# Patient Record
Sex: Female | Born: 1960
Health system: Southern US, Community
[De-identification: ages and names within clinical notes are randomized; demographics above are authoritative.]

## PROBLEM LIST (undated history)

## (undated) DIAGNOSIS — F329 Major depressive disorder, single episode, unspecified: Secondary | ICD-10-CM

## (undated) DIAGNOSIS — R21 Rash and other nonspecific skin eruption: Secondary | ICD-10-CM

## (undated) DIAGNOSIS — T8859XA Other complications of anesthesia, initial encounter: Secondary | ICD-10-CM

## (undated) DIAGNOSIS — I493 Ventricular premature depolarization: Secondary | ICD-10-CM

## (undated) DIAGNOSIS — K219 Gastro-esophageal reflux disease without esophagitis: Secondary | ICD-10-CM

## (undated) DIAGNOSIS — D509 Iron deficiency anemia, unspecified: Secondary | ICD-10-CM

## (undated) DIAGNOSIS — A879 Viral meningitis, unspecified: Secondary | ICD-10-CM

## (undated) DIAGNOSIS — M5136 Other intervertebral disc degeneration, lumbar region: Secondary | ICD-10-CM

## (undated) DIAGNOSIS — E663 Overweight: Secondary | ICD-10-CM

## (undated) DIAGNOSIS — G43909 Migraine, unspecified, not intractable, without status migrainosus: Secondary | ICD-10-CM

## (undated) DIAGNOSIS — F32A Depression, unspecified: Secondary | ICD-10-CM

## (undated) DIAGNOSIS — N3289 Other specified disorders of bladder: Secondary | ICD-10-CM

## (undated) DIAGNOSIS — T4145XA Adverse effect of unspecified anesthetic, initial encounter: Secondary | ICD-10-CM

## (undated) DIAGNOSIS — E669 Obesity, unspecified: Secondary | ICD-10-CM

## (undated) DIAGNOSIS — R000 Tachycardia, unspecified: Secondary | ICD-10-CM

## (undated) DIAGNOSIS — C719 Malignant neoplasm of brain, unspecified: Secondary | ICD-10-CM

## (undated) DIAGNOSIS — D649 Anemia, unspecified: Secondary | ICD-10-CM

## (undated) DIAGNOSIS — M51369 Other intervertebral disc degeneration, lumbar region without mention of lumbar back pain or lower extremity pain: Secondary | ICD-10-CM

## (undated) DIAGNOSIS — I10 Essential (primary) hypertension: Secondary | ICD-10-CM

## (undated) HISTORY — DX: Ventricular premature depolarization: I49.3

## (undated) HISTORY — PX: OTHER SURGICAL HISTORY: SHX169

## (undated) HISTORY — PX: BACK SURGERY: SHX140

## (undated) HISTORY — DX: Overweight: E66.3

## (undated) HISTORY — PX: ABDOMINAL HYSTERECTOMY: SHX81

## (undated) HISTORY — DX: Depression, unspecified: F32.A

## (undated) HISTORY — PX: CYSTOCELE REPAIR: SHX163

## (undated) HISTORY — DX: Migraine, unspecified, not intractable, without status migrainosus: G43.909

## (undated) HISTORY — DX: Tachycardia, unspecified: R00.0

## (undated) HISTORY — DX: Essential (primary) hypertension: I10

## (undated) HISTORY — PX: PORTACATH PLACEMENT: SHX2246

## (undated) HISTORY — DX: Iron deficiency anemia, unspecified: D50.9

## (undated) HISTORY — DX: Anemia, unspecified: D64.9

## (undated) HISTORY — DX: Major depressive disorder, single episode, unspecified: F32.9

## (undated) HISTORY — DX: Malignant neoplasm of brain, unspecified: C71.9

## (undated) HISTORY — PX: BLADDER SURGERY: SHX569

## (undated) HISTORY — DX: Viral meningitis, unspecified: A87.9

## (undated) HISTORY — PX: BILATERAL SALPINGOOPHORECTOMY: SHX1223

## (undated) HISTORY — DX: Obesity, unspecified: E66.9

---

## 1999-01-20 ENCOUNTER — Other Ambulatory Visit: Admission: RE | Admit: 1999-01-20 | Discharge: 1999-01-20 | Payer: Self-pay | Admitting: Obstetrics and Gynecology

## 2000-02-29 ENCOUNTER — Other Ambulatory Visit: Admission: RE | Admit: 2000-02-29 | Discharge: 2000-02-29 | Payer: Self-pay | Admitting: Obstetrics and Gynecology

## 2001-04-03 ENCOUNTER — Other Ambulatory Visit: Admission: RE | Admit: 2001-04-03 | Discharge: 2001-04-03 | Payer: Self-pay | Admitting: Obstetrics and Gynecology

## 2001-06-24 ENCOUNTER — Emergency Department (HOSPITAL_COMMUNITY): Admission: EM | Admit: 2001-06-24 | Discharge: 2001-06-24 | Payer: Self-pay | Admitting: Emergency Medicine

## 2002-05-18 ENCOUNTER — Encounter: Admission: RE | Admit: 2002-05-18 | Discharge: 2002-05-18 | Payer: Self-pay | Admitting: Occupational Medicine

## 2002-05-18 ENCOUNTER — Encounter: Payer: Self-pay | Admitting: Occupational Medicine

## 2002-08-25 ENCOUNTER — Other Ambulatory Visit: Admission: RE | Admit: 2002-08-25 | Discharge: 2002-08-25 | Payer: Self-pay | Admitting: Obstetrics and Gynecology

## 2002-12-16 ENCOUNTER — Encounter: Admission: RE | Admit: 2002-12-16 | Discharge: 2002-12-16 | Payer: Self-pay | Admitting: Occupational Medicine

## 2003-02-23 ENCOUNTER — Encounter: Admission: RE | Admit: 2003-02-23 | Discharge: 2003-05-24 | Payer: Self-pay | Admitting: Occupational Medicine

## 2003-05-06 ENCOUNTER — Encounter: Payer: Self-pay | Admitting: Orthopedic Surgery

## 2003-05-06 ENCOUNTER — Ambulatory Visit (HOSPITAL_COMMUNITY): Admission: RE | Admit: 2003-05-06 | Discharge: 2003-05-06 | Payer: Self-pay | Admitting: Orthopedic Surgery

## 2003-09-28 ENCOUNTER — Other Ambulatory Visit: Admission: RE | Admit: 2003-09-28 | Discharge: 2003-09-28 | Payer: Self-pay | Admitting: Obstetrics and Gynecology

## 2003-11-03 ENCOUNTER — Encounter (INDEPENDENT_AMBULATORY_CARE_PROVIDER_SITE_OTHER): Payer: Self-pay | Admitting: Specialist

## 2003-11-04 ENCOUNTER — Inpatient Hospital Stay (HOSPITAL_COMMUNITY): Admission: RE | Admit: 2003-11-04 | Discharge: 2003-11-05 | Payer: Self-pay | Admitting: Obstetrics and Gynecology

## 2004-11-03 ENCOUNTER — Other Ambulatory Visit: Admission: RE | Admit: 2004-11-03 | Discharge: 2004-11-03 | Payer: Self-pay | Admitting: Obstetrics and Gynecology

## 2005-10-07 ENCOUNTER — Encounter: Payer: Self-pay | Admitting: Emergency Medicine

## 2005-10-08 ENCOUNTER — Inpatient Hospital Stay (HOSPITAL_COMMUNITY): Admission: EM | Admit: 2005-10-08 | Discharge: 2005-10-12 | Payer: Self-pay | Admitting: Emergency Medicine

## 2005-10-08 ENCOUNTER — Encounter: Payer: Self-pay | Admitting: Cardiology

## 2005-10-08 DIAGNOSIS — C719 Malignant neoplasm of brain, unspecified: Secondary | ICD-10-CM

## 2005-10-08 HISTORY — DX: Malignant neoplasm of brain, unspecified: C71.9

## 2005-10-09 ENCOUNTER — Encounter (INDEPENDENT_AMBULATORY_CARE_PROVIDER_SITE_OTHER): Payer: Self-pay | Admitting: *Deleted

## 2005-10-09 DIAGNOSIS — C719 Malignant neoplasm of brain, unspecified: Secondary | ICD-10-CM

## 2005-10-09 DIAGNOSIS — C712 Malignant neoplasm of temporal lobe: Secondary | ICD-10-CM | POA: Insufficient documentation

## 2005-10-09 HISTORY — DX: Malignant neoplasm of brain, unspecified: C71.9

## 2005-10-11 ENCOUNTER — Ambulatory Visit: Payer: Self-pay | Admitting: Hematology and Oncology

## 2005-10-12 ENCOUNTER — Ambulatory Visit: Payer: Self-pay | Admitting: Hematology and Oncology

## 2005-10-17 ENCOUNTER — Ambulatory Visit: Admission: RE | Admit: 2005-10-17 | Discharge: 2005-11-15 | Payer: Self-pay | Admitting: Radiation Oncology

## 2005-10-25 ENCOUNTER — Ambulatory Visit (HOSPITAL_COMMUNITY): Admission: RE | Admit: 2005-10-25 | Discharge: 2005-10-25 | Payer: Self-pay | Admitting: Hematology and Oncology

## 2007-01-07 ENCOUNTER — Ambulatory Visit (HOSPITAL_COMMUNITY): Admission: RE | Admit: 2007-01-07 | Discharge: 2007-01-07 | Payer: Self-pay | Admitting: Obstetrics and Gynecology

## 2008-01-09 ENCOUNTER — Ambulatory Visit (HOSPITAL_COMMUNITY): Admission: RE | Admit: 2008-01-09 | Discharge: 2008-01-09 | Payer: Self-pay | Admitting: Obstetrics and Gynecology

## 2008-01-28 ENCOUNTER — Encounter: Admission: RE | Admit: 2008-01-28 | Discharge: 2008-01-28 | Payer: Self-pay | Admitting: Obstetrics and Gynecology

## 2008-10-31 ENCOUNTER — Emergency Department (HOSPITAL_COMMUNITY): Admission: EM | Admit: 2008-10-31 | Discharge: 2008-10-31 | Payer: Self-pay | Admitting: *Deleted

## 2008-11-01 ENCOUNTER — Encounter: Payer: Self-pay | Admitting: Cardiology

## 2008-11-10 ENCOUNTER — Encounter: Admission: RE | Admit: 2008-11-10 | Discharge: 2008-11-10 | Payer: Self-pay | Admitting: Neurosurgery

## 2009-01-12 ENCOUNTER — Encounter: Payer: Self-pay | Admitting: Cardiology

## 2009-01-14 ENCOUNTER — Ambulatory Visit (HOSPITAL_COMMUNITY): Admission: RE | Admit: 2009-01-14 | Discharge: 2009-01-15 | Payer: Self-pay | Admitting: Neurosurgery

## 2009-01-26 ENCOUNTER — Encounter: Admission: RE | Admit: 2009-01-26 | Discharge: 2009-01-26 | Payer: Self-pay | Admitting: Obstetrics and Gynecology

## 2009-10-10 ENCOUNTER — Encounter: Payer: Self-pay | Admitting: Cardiology

## 2010-01-26 ENCOUNTER — Encounter: Payer: Self-pay | Admitting: Cardiology

## 2010-01-31 ENCOUNTER — Encounter: Admission: RE | Admit: 2010-01-31 | Discharge: 2010-01-31 | Payer: Self-pay | Admitting: Obstetrics and Gynecology

## 2010-03-27 ENCOUNTER — Encounter: Payer: Self-pay | Admitting: Cardiology

## 2010-06-16 DIAGNOSIS — G43909 Migraine, unspecified, not intractable, without status migrainosus: Secondary | ICD-10-CM | POA: Insufficient documentation

## 2010-06-16 DIAGNOSIS — N83209 Unspecified ovarian cyst, unspecified side: Secondary | ICD-10-CM | POA: Insufficient documentation

## 2010-06-20 ENCOUNTER — Ambulatory Visit: Payer: Self-pay | Admitting: Cardiology

## 2010-06-20 DIAGNOSIS — I4949 Other premature depolarization: Secondary | ICD-10-CM | POA: Insufficient documentation

## 2010-06-20 DIAGNOSIS — E663 Overweight: Secondary | ICD-10-CM | POA: Insufficient documentation

## 2010-06-20 DIAGNOSIS — R000 Tachycardia, unspecified: Secondary | ICD-10-CM | POA: Insufficient documentation

## 2010-06-21 ENCOUNTER — Encounter: Payer: Self-pay | Admitting: Cardiology

## 2010-06-21 ENCOUNTER — Ambulatory Visit (HOSPITAL_COMMUNITY): Admission: RE | Admit: 2010-06-21 | Discharge: 2010-06-21 | Payer: Self-pay | Admitting: Cardiology

## 2010-06-21 ENCOUNTER — Ambulatory Visit: Payer: Self-pay

## 2010-06-21 ENCOUNTER — Ambulatory Visit: Payer: Self-pay | Admitting: Cardiology

## 2010-06-28 ENCOUNTER — Ambulatory Visit: Payer: Self-pay | Admitting: Cardiology

## 2010-07-31 ENCOUNTER — Encounter: Payer: Self-pay | Admitting: Cardiology

## 2010-08-01 ENCOUNTER — Ambulatory Visit: Payer: Self-pay | Admitting: Cardiology

## 2010-10-29 ENCOUNTER — Encounter: Payer: Self-pay | Admitting: Obstetrics and Gynecology

## 2010-10-29 ENCOUNTER — Encounter: Payer: Self-pay | Admitting: Neurosurgery

## 2010-11-07 NOTE — Letter (Signed)
Summary: [facility] - Hematology & Oncology OutPatient Clinic - Pathology Repor  [facility] - Hematology & Oncology OutPatient Clinic - Pathology Report   Imported By: Marylou Mccoy 07/17/2010 08:06:22  _____________________________________________________________________  External Attachment:    Type:   Image     Comment:   External Document

## 2010-11-07 NOTE — Miscellaneous (Signed)
  Clinical Lists Changes  Observations: Added new observation of ECHOINTERP:   Study Conclusions    - Left ventricle: Wall thickness was increased in a pattern of mild     LVH. The estimated ejection fraction was 70%. Wall motion was     normal; there were no regional wall motion abnormalities. Left     ventricular diastolic function parameters were normal.   - Left atrium: The atrium was mildly dilated.   - Right ventricle: The cavity size was mildly dilated. Systolic     function was normal.   - Right atrium: The atrium was mildly dilated.   Transthoracic echocardiography. M-mode, complete 2D, spectral   Doppler, and color Doppler. Height: Height: 162.6cm. Height: 64in.   Weight: Weight: 127.9kg. Weight: 281.4lb. Body mass index: BMI:   48.4kg/m^2. Body surface area: BSA: 2.15m^2. Blood pressure: 139/97.   Patient status: Outpatient. Location: [facility]   Prepared and Electronically Authenticated by    Willa Rough, MD, [facility]  (06/21/2010 9:59)      Echocardiogram  Procedure date:  06/21/2010  Findings:        Study Conclusions    - Left ventricle: Wall thickness was increased in a pattern of mild     LVH. The estimated ejection fraction was 70%. Wall motion was     normal; there were no regional wall motion abnormalities. Left     ventricular diastolic function parameters were normal.   - Left atrium: The atrium was mildly dilated.   - Right ventricle: The cavity size was mildly dilated. Systolic     function was normal.   - Right atrium: The atrium was mildly dilated.   Transthoracic echocardiography. M-mode, complete 2D, spectral   Doppler, and color Doppler. Height: Height: 162.6cm. Height: 64in.   Weight: Weight: 127.9kg. Weight: 281.4lb. Body mass index: BMI:   48.4kg/m^2. Body surface area: BSA: 2.56m^2. Blood pressure: 139/97.   Patient status: Outpatient. Location: [facility]   Prepared and Electronically Authenticated by    Willa Rough,  MD, [facility]

## 2010-11-07 NOTE — Assessment & Plan Note (Signed)
Summary: np6/dx:eval myoview for new sinus arrhythmia/htn/obesity   Visit Type:  Initial Consult Primary Provider:  Liana Crocker, MD  CC:  Tachycardia.  History of Present Illness: The patient presents for evaluation of tachycardia.  She has had no prior cardiac history. She has noticed an increasing baseline heart rate over months. She's also noticed some increased dyspnea with activity such as walking 100 yards on level ground. She is not describing resting shortness of breath, PND or orthopnea. This has been a slowly progressive complaint. She has some vague chest discomfort with this but no jaw or arm discomfort. 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 She has not had any presyncope or syncope. Of note she has not been particularly active she walks at work. She has had a rough few years being treated for a glioblastoma and having back surgery.  Current Medications (verified): 1)  Lisinopril 20 Mg Tabs (Lisinopril) .... Take One Tablet By Mouth Daily 2)  Estradiol 2 Mg Tabs (Estradiol) .... Once Daily 3)  Nexium 40 Mg Cpdr (Esomeprazole Magnesium) .... Once Daily 4)  Flexeril 10 Mg Tabs (Cyclobenzaprine Hcl) .... Uad 5)  Mobic 7.5 Mg Tabs (Meloxicam) .... As Needed 6)  Vicodin 5-500 Mg Tabs (Hydrocodone-Acetaminophen) .... As Needed 7)  Ativan 1 Mg Tabs (Lorazepam) .... As Needed  Allergies (verified): 1)  ! Sudafed 2)  ! Morphine 3)  ! * Red Dye 4)  ! * Shrimp  Past History:  Past Medical History: Migraine headaches. Bilateral ovarian cysts, Glioblastoma multiform Depression/anxiety Hypertension Viral meningitis Microcytic anemia  Past Surgical History: Hysterectomy Bilateral salpingo-oophorectomy Bbladder tack. Resection of a glioblastoma multiform in Lumbar back surgery  Family History:  Pertinent in that her niece has cancer of the tongue.  There is no history of brain tumors. Her  father had heart failure and atrial fibrillation and later age. Her mother had bypass at age 7.  Review of Systems       Positive for migraines, PVCs, reflux, ankle edema. Otherwise as stated in the history of present illness negative for all other systems.  Vital Signs:  Patient profile:   50 year old female Height:      64 inches Weight:      284 pounds BMI:     48.92 Pulse rate:   98 / minute BP sitting:   122 / 88  (right arm)  Vitals Entered By: Laurance Flatten CMA (June 20, 2010 12:37 PM)  Physical Exam  General:  Well developed, well nourished, in no acute distress. Head:  normocephalic and atraumatic Eyes:  PERRLA/EOM intact; conjunctiva and lids normal. Mouth:  Teeth, gums and palate normal. Oral mucosa normal. Neck:  Neck supple, no JVD. No masses, thyromegaly or abnormal cervical nodes. Chest Wall:  no deformities or breast masses noted Lungs:  Clear bilaterally to auscultation and percussion. Abdomen:  Bowel sounds positive; abdomen soft and non-tender without masses, organomegaly, or hernias noted. No hepatosplenomegaly. Msk:  Back normal, normal gait. Muscle strength and tone normal. Extremities:  No clubbing or cyanosis. Neurologic:  Alert and oriented x 3. Skin:  Intact without lesions or rashes. Cervical Nodes:  no significant adenopathy Psych:  Normal affect.   Detailed Cardiovascular Exam  Neck    Carotids: Carotids full and equal bilaterally without bruits.      Neck Veins: Normal, no JVD.    Heart    Inspection: no deformities or lifts  noted.      Palpation: normal PMI with no thrills palpable.      Auscultation: regular rate and rhythm, S1, S2 without murmurs, rubs, gallops, or clicks.    Vascular    Abdominal Aorta: no palpable masses, pulsations, or audible bruits.      Femoral Pulses: normal femoral pulses bilaterally.      Pedal Pulses: normal pedal pulses bilaterally.      Radial Pulses: normal radial pulses bilaterally.      Peripheral  Circulation: no clubbing, cyanosis, or edema noted with normal capillary refill.     EKG  Procedure date:  06/20/2010  Findings:      Sinus rhythm, rate 98, axis within normal limits, intervals within normal limits, poor anterior R-wave progression  Impression & Recommendations:  Problem # 1:  UNSPECIFIED TACHYCARDIA (ICD-785.0) The patient has tachycardia and an abnormal EKG. Chest has dyspnea with exertion which is slowly progressive. 2 evaluate left ventricular function and exclude regional wall motion abnormality she will have an echocardiogram. I will also perform an exercise treadmill test. Further evaluation and treatment will be based on these studies. Orders: Echocardiogram (Echo) Treadmill (Treadmill)  Problem # 2:  PREMATURE VENTRICULAR CONTRACTIONS (ICD-427.69) These are not particularly symptomatic but will be evaluated as above. Orders: EKG w/ Interpretation (93000)  Problem # 3:  OVERWEIGHT (ICD-278.02) The patient is considering bariatric surgery. The above evaluation will serve as preoperative clearance as well.  Patient Instructions: 1)  Your physician recommends that you schedule a follow-up appointment at the time of your testing 2)  Your physician recommends that you continue on your current medications as directed. Please refer to the Current Medication list given to you today. 3)  Your physician has requested that you have an echocardiogram.  Echocardiography is a painless test that uses sound waves to create images of your heart. It provides your doctor with information about the size and shape of your heart and how well your heart's chambers and valves are working.  This procedure takes approximately one hour. There are no restrictions for this procedure. 4)  Your physician has requested that you have an exercise tolerance test.  For further information please visit [email or web address]  Please also follow instruction sheet, as given.

## 2010-11-07 NOTE — Letter (Signed)
Summary: [facility] - Hematology & Oncology OutPatient Clinic  [facility] - Hematology & Oncology OutPatient Clinic   Imported By: Marylou Mccoy 07/17/2010 07:59:32  _____________________________________________________________________  External Attachment:    Type:   Image     Comment:   External Document

## 2010-11-07 NOTE — Assessment & Plan Note (Signed)
Summary: 1 month rov.sl   Visit Type:  Follow-up Primary Provider:  Liana Crocker, MD  CC:  HTN and Tachycardia.  History of Present Illness: The patient presents for followup of hypertension and tachycardia. She had a treadmill stress test which demonstrated no evidence of ischemia. She did have sinus tachycardia with a lower level of exercise. I took her off of her ACE inhibitor and started metoprolol. Since that time she has been monitoring her heart rate and blood pressure and she reports that she has been doing well. Her heart rate runs in the 70s at rest 90s with exertion and blood pressures have been well controlled. She's had no palpitations. She said no new shortness of breath and denies any chest pain. She has been exercising on the treadmill.  Current Medications (verified): 1)  Estradiol 2 Mg Tabs (Estradiol) .... Once Daily 2)  Nexium 40 Mg Cpdr (Esomeprazole Magnesium) .... Once Daily 3)  Flexeril 10 Mg Tabs (Cyclobenzaprine Hcl) .... Uad 4)  Mobic 7.5 Mg Tabs (Meloxicam) .... As Needed 5)  Vicodin 5-500 Mg Tabs (Hydrocodone-Acetaminophen) .... As Needed 6)  Ativan 1 Mg Tabs (Lorazepam) .... As Needed 7)  Metoprolol Tartrate 25 Mg Tabs (Metoprolol Tartrate) .Marland Kitchen.. 1 By Mouth Two Times A Day  Allergies (verified): 1)  ! Sudafed 2)  ! Morphine 3)  ! * Red Dye 4)  ! * Shrimp  Past History:  Past Medical History: Reviewed history from 06/20/2010 and no changes required. Migraine headaches. Bilateral ovarian cysts, Glioblastoma multiform Depression/anxiety Hypertension Viral meningitis Microcytic anemia  Past Surgical History: Reviewed history from 06/20/2010 and no changes required. Hysterectomy Bilateral salpingo-oophorectomy Bbladder tack. Resection of a glioblastoma multiform in Lumbar back surgery  Review of Systems       As stated in the HPI and negative for all other systems.  Vital Signs:  Patient profile:   50 year old female Height:      64  inches Weight:      279 pounds BMI:     48.06 Pulse rate:   77 / minute Resp:     18 per minute BP sitting:   112 / 78  (right arm)  Vitals Entered By: Marrion Coy, CNA (August 01, 2010 10:14 AM)  Physical Exam  General:  Well developed, well nourished, in no acute distress. Head:  normocephalic and atraumatic Eyes:  PERRLA/EOM intact; conjunctiva and lids normal. Mouth:  Teeth, gums and palate normal. Oral mucosa normal. Neck:  Neck supple, no JVD. No masses, thyromegaly or abnormal cervical nodes. Chest Wall:  no deformities or breast masses noted Lungs:  Clear bilaterally to auscultation and percussion. Heart:  Non-displaced PMI, chest non-tender; regular rate and rhythm, S1, S2 without murmurs, rubs or gallops. Carotid upstroke normal, no bruit. Normal abdominal aortic size, no bruits. Femorals normal pulses, no bruits. Pedals normal pulses. No edema, no varicosities. Abdomen:  Bowel sounds positive; abdomen soft and non-tender without masses, organomegaly, or hernias noted. No hepatosplenomegaly. Msk:  Back normal, normal gait. Muscle strength and tone normal. Extremities:  No clubbing or cyanosis. Psych:  Normal affect.    Impression & Recommendations:  Problem # 1:  PREMATURE VENTRICULAR CONTRACTIONS (ICD-427.69) She is having no symptomatic palpitations and her heart rate seems to be improved on low dose beta blocker.  She will continue on this regimen.  Problem # 2:  OVERWEIGHT (ICD-278.02) She will continue with her exercise regimen and diet.

## 2010-11-07 NOTE — Letter (Signed)
Summary: [facility] - Hematology & Oncology OutPatient Clinic - Brain MRI w/ &   [facility] - Hematology & Oncology OutPatient Clinic - Brain MRI w/ & w/o Infusion   Imported By: Marylou Mccoy 07/17/2010 08:04:06  _____________________________________________________________________  External Attachment:    Type:   Image     Comment:   External Document

## 2010-11-07 NOTE — Letter (Signed)
Summary: [facility] - Hematology & Oncology OutPatient Clinic - Brain MRI w/ &   [facility] - Hematology & Oncology OutPatient Clinic - Brain MRI w/ & w/o Infusion   Imported By: Marylou Mccoy 07/17/2010 08:03:16  _____________________________________________________________________  External Attachment:    Type:   Image     Comment:   External Document

## 2010-12-29 ENCOUNTER — Other Ambulatory Visit: Payer: Self-pay | Admitting: Obstetrics and Gynecology

## 2010-12-29 DIAGNOSIS — Z1231 Encounter for screening mammogram for malignant neoplasm of breast: Secondary | ICD-10-CM

## 2011-01-17 LAB — DIFFERENTIAL
Basophils Absolute: 0.1 10*3/uL (ref 0.0–0.1)
Basophils Relative: 1 % (ref 0–1)
Lymphs Abs: 2.6 10*3/uL (ref 0.7–4.0)
Monocytes Absolute: 0.5 10*3/uL (ref 0.1–1.0)
Monocytes Relative: 7 % (ref 3–12)
Neutro Abs: 4.1 10*3/uL (ref 1.7–7.7)
Neutrophils Relative %: 55 % (ref 43–77)

## 2011-01-17 LAB — URINALYSIS, ROUTINE W REFLEX MICROSCOPIC
Bilirubin Urine: NEGATIVE
Glucose, UA: NEGATIVE mg/dL
Ketones, ur: 15 mg/dL — AB
Leukocytes, UA: NEGATIVE
Nitrite: NEGATIVE

## 2011-01-17 LAB — COMPREHENSIVE METABOLIC PANEL
AST: 23 U/L (ref 0–37)
Albumin: 3.5 g/dL (ref 3.5–5.2)
Alkaline Phosphatase: 98 U/L (ref 39–117)
BUN: 9 mg/dL (ref 6–23)
CO2: 27 mEq/L (ref 19–32)
Chloride: 107 mEq/L (ref 96–112)
Creatinine, Ser: 0.68 mg/dL (ref 0.4–1.2)
GFR calc Af Amer: >60 mL/min (ref >60)
Glucose, Bld: 83 mg/dL (ref 70–99)
Total Bilirubin: 0.7 mg/dL (ref 0.3–1.2)
Total Protein: 6 g/dL (ref 6.0–8.3)

## 2011-01-17 LAB — CBC
HCT: 35.7 % — ABNORMAL LOW (ref 36.0–46.0)
MCHC: 34.4 g/dL (ref 30.0–36.0)
MCV: 82.4 fL (ref 78.0–100.0)
Platelets: 281 10*3/uL (ref 150–400)
RBC: 4.34 MIL/uL (ref 3.87–5.11)
WBC: 7.4 10*3/uL (ref 4.0–10.5)

## 2011-01-17 LAB — URINE MICROSCOPIC-ADD ON

## 2011-01-17 LAB — PROTIME-INR: INR: 1 (ref 0.00–1.49)

## 2011-01-17 LAB — APTT: aPTT: 27 seconds (ref 24–37)

## 2011-01-22 LAB — POCT I-STAT, CHEM 8
Calcium, Ion: 1.1 mmol/L — ABNORMAL LOW (ref 1.12–1.32)
Chloride: 105 mEq/L (ref 96–112)
Glucose, Bld: 86 mg/dL (ref 70–99)
HCT: 39 % (ref 36.0–46.0)
Hemoglobin: 13.3 g/dL (ref 12.0–15.0)
Potassium: 4.6 mEq/L (ref 3.5–5.1)
Sodium: 140 mEq/L (ref 135–145)

## 2011-01-22 LAB — CBC
HCT: 36.7 % (ref 36.0–46.0)
MCHC: 32.8 g/dL (ref 30.0–36.0)
RBC: 4.48 MIL/uL (ref 3.87–5.11)
RDW: 13.9 % (ref 11.5–15.5)
WBC: 7.6 10*3/uL (ref 4.0–10.5)

## 2011-01-22 LAB — DIFFERENTIAL
Basophils Relative: 1 % (ref 0–1)
Eosinophils Relative: 1 % (ref 0–5)
Monocytes Absolute: 0.6 10*3/uL (ref 0.1–1.0)

## 2011-02-14 ENCOUNTER — Ambulatory Visit
Admission: RE | Admit: 2011-02-14 | Discharge: 2011-02-14 | Disposition: A | Discharge status: Home / Self Care | Payer: Commercial Managed Care - PPO | Source: Ambulatory Visit | Attending: Obstetrics and Gynecology | Admitting: Obstetrics and Gynecology

## 2011-02-14 DIAGNOSIS — Z1231 Encounter for screening mammogram for malignant neoplasm of breast: Secondary | ICD-10-CM

## 2011-02-20 NOTE — H&P (Signed)
NAME:  [name] [name], ABRAMS              ACCOUNT NO.:  [id number]   MEDICAL RECORD NO.:  [id number]          PATIENT TYPE:  OIB   LOCATION:  3007                         FACILITY:  [facility]   PHYSICIAN:  Payton Doughty, M.D.      DATE OF BIRTH:  09-21-1961   DATE OF ADMISSION:  01/14/2009  DATE OF DISCHARGE:  01/15/2009                              HISTORY & PHYSICAL   ADMISSION DIAGNOSIS:  Left L4 radiculopathy secondary to herniated disk  at L3-4.   BODY OF TEXT:  A 50 year old right-handed white girl who m I have known  for several years.  In 2007, she had a glioblastoma resected  and in  study with Dr. Kayleen Memos and actually doing quite well.  She presented with  some left leg pain, was studied with an MR showed herniated disk at 3-4  off the left.  She underwent epidural steroids, it did not really help  her, and she had some weakness in her leg.   MEDICAL HISTORY:  As noted glioblastoma, otherwise reasonably good  health.   MEDICATIONS:  Omeprazole, estradiol, lisinopril, Flexeril, Vicodin,  Detrol, Ativan, Mobic, and a trial infusion.   ALLERGIES:  SUDAFED, RED DYE, MORPHINE, TEGADERM, and SHRIMP.   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   REVIEW OF SYSTEMS:  Remarkable for back and left leg pain.   HEENT:  Within normal limits.  She has reasonable range of motion in her  neck.  CHEST:  Clear.  CARDIAC:  Regular rate and rhythm.  ABDOMEN:  Somewhat large, but nontender with no hepatosplenomegaly.  EXTREMITIES:  Without clubbing or cyanosis.  Peripheral pulses are good.  GU:  Deferred.  NEUROLOGIC:  She is awake, alert, and oriented.  Cranial nerves are  intact.  Motor exam shows 5/5 strength throughout the upper and lower  extremities save for the knee extensors on the left side.  Knee jerk is  absent on the left shows a strongly positive straight leg raise on the  left.   MR results demonstrated disk at 3-4.   CLINICAL  IMPRESSION:  Left L4 radiculopathy, related to herniated disk.   PLAN:  For lumbar laminectomy and diskectomy.  The risks and benefits  have been discussed with her and she wishes to proceed.            ______________________________  Payton Doughty, M.D.     MWR/MEDQ  D:  01/14/2009  T:  01/15/2009  Job:  [id number]

## 2011-02-20 NOTE — Op Note (Signed)
NAME:  [name] [name], [name] [name]              ACCOUNT NO.:  [id number]   MEDICAL RECORD NO.:  [id number]          PATIENT TYPE:  OIB   LOCATION:  3007                         FACILITY:  [facility]   PHYSICIAN:  Payton Doughty, M.D.      DATE OF BIRTH:  November 20, 1960   DATE OF PROCEDURE:  01/14/2009  DATE OF DISCHARGE:  01/15/2009                               OPERATIVE REPORT   PREOPERATIVE DIAGNOSIS:  Herniated disk, L3-L4 on the left.   POSTOPERATIVE DIAGNOSIS:  Herniated disk, L3-L4 on the left.   PROCEDURE:  L3-L4 laminectomy and diskectomy.   SURGEON:  Payton Doughty, MD   ANESTHESIA:  General endotracheal.   PREPARATION:  Prepped and draped with alcohol wipe.   COMPLICATIONS:  None.   ASSISTANT:  Coletta Memos, MD   BODY OF TEXT:  This is a 50 year old girl with a left L4 radiculopathy  and herniated disk at L3-L4 taken to operating room, smoothly  anesthetized, intubated, and placed prone on the operating table.  Following shave, prep, and drape in the usual sterile fashion, skin was  infiltrated with 1% lidocaine with 1:400,000 epinephrine.  The skin was  sized over the lamina of L3.  It was dissected free in the subperiosteal  plane.  Intraoperative x-ray confirmed correctness level.  Having  confirmed the correctness level, hemi-semi-laminectomy of L3 was carried  out to the top of ligamentum flavum.  This was removed in retrograde  fashion.  The lateral aspect of this thecal sac as well as the L4 nerve  root were exposed.  It was gently dissected free and retracted medially  exposing a fairly hard knob of mostly annular fibers with some nuclear  material underneath it.  This was removed without difficulty.  The nerve  root was greatly decompressed with this maneuver.  Following  decompression, the wound was irrigated.  Hemostasis was assured.  The  disk space was explored and found to be open.  Depo-Medrol soaked fat  was placed in laminectomy defect and successive layers of 0-Vicryl,  2-0  Vicryl, and 3-0 nylon were used to close.  Betadine and Telfa dressing  was applied and made occlusive with OpSite and the patient returned to  the recovery room in good condition.           ______________________________  Payton Doughty, M.D.     MWR/MEDQ  D:  01/14/2009  T:  01/15/2009  Job:  [id number]

## 2011-02-23 NOTE — Consult Note (Signed)
NAME:  [name] [name]              ACCOUNT NO.:  [id number]   MEDICAL RECORD NO.:  [id number]          PATIENT TYPE:  INP   LOCATION:  3107                         FACILITY:  [facility]   PHYSICIAN:  Lauretta I. Odogwu, M.D.DATE OF BIRTH:  [birth year]   DATE OF CONSULTATION:  10/11/2005  DATE OF DISCHARGE:                                   CONSULTATION   CONSULTING PHYSICIAN:  Payton Doughty, M.D.   REASON FOR CONSULTATION:  Patient with brain mass.   FINDINGS:  [name] [name] is a 49 year old woman who gives a history of  migraines since her early teenage years.  The patient states that at age 71  the migraines had increased in frequency and presented with visual  disturbance, isolated behind the left eye with sensitivity to smell.  She  was seen and managed by Dr. Meryl Crutch and treated with a combination of  migraine drugs such as Imitrex, Maxalt, Topamax and more recently Estrace.  She was doing well until six months ago and specifically in the last two  months began to notice increase in frequency.  Over the New Year weekend,  the headache was so intense, associated with tingling on the right side of  the face and right arm but without extremity weakness or seizure activity.  With these findings, the patient presented to the emergency room.  Thorough  evaluation included a CT scan of the brain on October 07, 2005 that  demonstrated a cystic and solid mass in the right parietal lobe with midline  shift.  The patient then went on to have a brain MRI on October 08, 2005  that demonstrated a solitary lesion measuring 63 mm x 48 mm x 49 mm in the  right temporo-occipital lobe.  The mass was intra-axial.  There was mild to  moderate vasogenic edema with compression of the occipital horn of the right  ventricle and 45-mm midline shift.  There were multiple small cysts of  varying sizes seen within the lesion.  On October 09, 2005, Dr. Trey Sailors  performed a right temporal lobe occipital mass  resection.  Frozen section  demonstrated a mixed-type tumor.  The lesion was excised in its entirety,  and pathology is currently pending.   The patient has no visual complaints, headache or extremity weakness at this  time.   PAST MEDICAL HISTORY:  1.  History of migraine headaches.  2.  Bilateral ovarian cysts, status post bilateral salpingo-oophorectomy in      January 2005.   MEDICATIONS:  1.  Decadron taper.  2.  Enablex 7.5 daily.  3.  Estrace.  4.  Pepcid 20 mg daily.  5.  Imitrex.  6.  Ambien.  7.  Zofran p.r.n. for nausea.   ALLERGIES:  THE PATIENT IS ALLERGIC TO SUDAFED AND RED DYE WHICH CAUSES  HIVES.   SOCIAL HISTORY:  The patient is married with two children, ages 95 and 62.  She denies a history of alcohol or tobacco use.  She is a surgery OR nurse.   FAMILY HISTORY:  Pertinent in that her niece has cancer of the tongue.  There is no history of brain tumors.   REVIEW OF SYSTEMS:  Denies fever, chills, night sweats, anorexia or weight  loss.  RESPIRATORY:  Denies cough, hemoptysis, wheeze, shortness of breath.  CARDIOVASCULAR:  Denies chest pain, PND, orthopnea or ankle swelling.  GENITOURINARY:  Denies dysuria or hematuria.  CENTRAL NERVOUS SYSTEM:  Presently denies headaches.  Admits to twitching behind left eye.  No  extremity weakness.   PHYSICAL EXAMINATION:  The patient is alert and oriented times three.  VITAL SIGNS:  Pulse 95, blood pressure 130/80, temperature 98, respirations  are 20, saturations 99% on room air.  HEENT:  The patient has baldness in the occipital region with healing  craniotomy incision with surrounding erythema but no discharge.  Extraocular  muscles intact.  Sclerae anicteric.  Pupils equal, round and reactive to  light.  Mouth moist without ulcerations, thrush or lesions.  CHEST:  Reveals good air entry bilaterally and is clear to both percussion  and auscultation.  CARDIOVASCULAR EXAM:  Reveals first and second heart sounds to  be present,  no added sounds or murmurs.  ABDOMEN:  Soft and nontender with no hepatosplenomegaly and bowel sounds  present.  EXTREMITIES:  Reveal no edema.  Pulses are present and symmetrical.  CENTRAL NERVOUS SYSTEM AND PERIPHERAL NERVOUS SYSTEM:  Reveals no focal  deficit.   LABORATORY DATA:  White cell count 13.1, hemoglobin 12.3, hematocrit 35.5,  platelets 301.  Sodium 137, potassium 3.5, chloride 105, CO2 25, BUN 10,  creatinine 0.9, glucose 135.  Total bilirubin 0.5, AST 26, ALT 20, LDH 189.   IMPRESSION:  The patient is a 50 year old female who is status post  resection of a right temporal lobe occipital mass that measured 6.3 x 4.8 x  4.9 cm on MRI.  Results of pathology are presently pending.   RECOMMENDATIONS:  The patient is doing fairly well postoperatively and is  pending discharge in the morning.  Plan to follow up with the patient as an  outpatient in one week's time.  At that time, will have the full pathology  report so that full recommendations can be made.  The patient was told that  if the tumor was malignat would consider referal to a tertiary care center.   Thank you for the consult.      Lauretta I. Odogwu, M.D.  Electronically Signed     LIO/MEDQ  D:  10/11/2005  T:  10/11/2005  Job:  [id number]

## 2011-02-23 NOTE — H&P (Signed)
NAME:  [name] [name], [name] [name]                         ACCOUNT NO.:  [id number]   MEDICAL RECORD NO.:  [id number]                   PATIENT TYPE:   LOCATION:                                       FACILITY:   PHYSICIAN:  Duke Salvia. Marcelle Overlie, M.D.            DATE OF BIRTH:   DATE OF ADMISSION:  11/03/2003  DATE OF DISCHARGE:                                HISTORY & PHYSICAL   CHIEF COMPLAINT:  1. Ovarian cyst.  2. Pelvic pain.   HISTORY OF PRESENT ILLNESS:  A 50 year old, G2, P2, who has had a prior TBH  anterior repair.  Over the last six to eight months as part of evaluation  for pelvic pain, she had a CT scan to rule out a stone that showed a 3 x 4 x  3.5 cm probable ovarian cyst.  Our followup in the office on June 02, 2003, showed the cyst had decreased to 2.1 x 1.3 cm, but she was still  experiencing some pelvic pain at that time.   Because of continued pain, she underwent followup ultrasound on October 14, 2003, that showed two cysts on the right, 3.1 x 3.5, some areas of  septation, and probable hemorrhage and on the left a 3.7 x 3.2 and another  one that was 3.3 x 2.4.  The CA125 was 3.4, but due to continued pain, she  presents now for definitive laparoscopic BSO, possible open BSO, and  posterior repair to correct a symptomatic rectocele at the same time.  This  procedure, including the risks of bleeding, infection, transfusion, adjacent  organ injury, and the expected recovery time, were reviewed with her.  Specifics regarding wound infection, phlebitis, the possible need for ERT,  all reviewed, which she understands and accepts.   ALLERGIES:  None.   CURRENT MEDICATIONS:  Cymbalta 30 mg daily.   PAST MEDICAL HISTORY:  The last Pap in December of 2004 was normal.  Estradiol and FSH in November of 2003 were 40 and 5.0, respectively.   PAST SURGICAL HISTORY:  1. TBH anterior repair.  2. Cesarean section x 2.   PHYSICAL EXAMINATION:  VITAL SIGNS:  Temperature 98.2, blood  pressure  120/72.  HEENT:  Unremarkable.  NECK:  Supple without mass.  LUNGS:  Clear.  CARDIOVASCULAR:  Regular rate and rhythm without murmurs, rubs, or gallops.  BREASTS:  Without masses.  ABDOMEN:  Soft, flat, and nontender.  PELVIC:  Normal external genitalia.  The vaginal cuff was clear.  Bimanual  negative, except for a small rectocele noted.  EXTREMITIES:  Unremarkable.  NEUROLOGIC:  Unremarkable.   IMPRESSION:  1. Chronic pelvic pain, recurrent ovarian cyst formation.  2. Symptomatic rectocele.   PLAN:  Diagnostic laparoscopy with laparoscopy BSO, possible open BSO, and  posterior repair.  Procedure and risks reviewed as above.  Richard M. Marcelle Overlie, M.D.    RMH/MEDQ  D:  11/01/2003  T:  11/01/2003  Job:  [id number]

## 2011-02-23 NOTE — Op Note (Signed)
NAME:  [name] [name], [name] [name]              ACCOUNT NO.:  [id number]   MEDICAL RECORD NO.:  [id number]          PATIENT TYPE:  INP   LOCATION:  3107                         FACILITY:  [facility]   PHYSICIAN:  Payton Doughty, M.D.      DATE OF BIRTH:  [birth year]   DATE OF PROCEDURE:  10/09/2005  DATE OF DISCHARGE:                                 OPERATIVE REPORT   PREOPERATIVE DIAGNOSIS:  Right temporal occipital brain tumor.   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   SURGEON:  Payton Doughty, M.D.   SERVICE:  Neurosurgery.   ANESTHESIA:  General endotracheal anesthesia.   PREPARATION:  Betadine scrub and alcohol wipe.   COMPLICATIONS:  None.   ASSISTANT:  Nurse assistant Tad Moore, M.D.   BODY OF TEXT:  This is a 50 year old female with right temporal occipital  brain tumor.  She is taken to the operating room, smoothly anesthetized and  intubated, placed in a left side down right side up lateral decubitus  position with the head in the [place] head holder.  Following shave, prep,  and drape in the usual sterile fashion, a horseshoe shaped incision was made  just behind the ear, carried up near the vertex, then down near the inion.  The flap was elevated based on the transfer sinus.  Bur holes were created  in each corner and the flap raised.  Ultrasound was then brought in through  the dura.  The tumor was dead center in the middle of the field.  The dura  was opened and the tumor was immediately obvious.  It was resected  piecemeal, frozen came back highly pleomorphic likely ganglioglioma awaiting  special stains.  Tumor resection continued around the periphery, separating  as best as possible from normal brain.  In a circumferential fashion, the  brisk bleeding was controlled with the bipolar.  Upon resection of the  tumor, bleeding ceased immediately.  Gross total resection was obtained.  The wound was copiously irrigated, hemostasis  assured.  The tumor bed was  lined with Surgicel, the dura was reapproximated with 4-0 Nurolon.  The bone  flap was replaced with Osteomed plates.  The galea, scalp, and skin was  closed.  Betadine and Telfa dressing was applied and made occlusive with  OpSite.  The patient was returned to the recovery room in good condition.           ______________________________  Payton Doughty, M.D.     MWR/MEDQ  D:  10/09/2005  T:  10/09/2005  Job:  [id number]

## 2011-02-23 NOTE — Op Note (Signed)
NAME:  [name] [name], [name] [name]                        ACCOUNT NO.:  [id number]   MEDICAL RECORD NO.:  [id number]                   [name] [name] TYPE:  OBV   LOCATION:  9399                                 FACILITY:  [facility]   PHYSICIAN:  Duke Salvia. Marcelle Overlie, M.D.            DATE OF BIRTH:  07-Sep-1961   DATE OF PROCEDURE:  11/03/2003  DATE OF DISCHARGE:                                 OPERATIVE REPORT   PREOPERATIVE DIAGNOSES:  1. Recurrent ovarian cyst.  2. Pelvic pain.  3. Mild rectocele.   POSTOPERATIVE DIAGNOSES:  1. Recurrent ovarian cyst.  2. Pelvic pain.  3. Mild rectocele.  4. Bilateral adnexal adhesions.   PROCEDURE:  Diagnostic laparoscopy followed by laparotomy with bilateral  salpingo-oophorectomy and lysis of adhesions, posterior colporrhaphy.   SURGEON:  Duke Salvia. Marcelle Overlie, M.D.   ANESTHESIA:  General endotracheal anesthesia.   COMPLICATIONS:  None.   DRAINS:  Foley catheter.   ESTIMATED BLOOD LOSS:  200 mL.   DESCRIPTION OF PROCEDURE:  The [name] [name] taken to the operating room.  After  an adequate level of general endotracheal anesthesia was obtained with  [name] [name]'s legs in the stirrups, the abdomen, perineum and vagina were  prepped and draped usual manner for laparoscopy.  The bladder was drained  with a Foley catheter.  The vagina was prepped separately with Betadine.  There was 5% Marcaine plain infiltrated in the subumbilical area.  A small  incision was made.  Long Veress needle was introduced without difficulty.  Its intra-abdominal position was verified by pressure and water testing.  After a 2.5 liter pneumoperitoneum was then created, laparoscopic trocar and  sleeve was then introduced without difficulty.  There was no evidence of any  bleeding or trauma.  Three to four fingerbreadths above the symphysis in the  midline, a 5 mm trocar was inserted under direct visualization.   The [name] [name] was then placed in Trendelenburg position and the pelvic  findings as  follows.  The uterus was surgically absent.  Both ovaries were  adherent to the right pelvic sidewall.  There was a small functional  appearing cyst on the left side.  No other abnormalities noted.  Decision  was made to proceed with laparotomy due to the adhesions.  The [name] [name]'s  legs were placed supine.  Pfannenstiel incision made through her old scar,  carried down the fascia which was incised and extended transversely.  Rectus  muscle divided in the midline.  Peritoneum entered superiorly without  incident and extended in the vertical fashion.  There were no adhesions into  the anterior abdominal wall.  O'Connor-O'Sullivan retractors then  positioned.  Bowel packed superiorly out of the field.  The [name] [name] then  placed in Trendelenburg position.  Starting on the right, the right ovary  was grasped with the Babcock clamp.  Sharp and blunt dissection was used to  free the ovary from the sidewall with careful dissection.  Right round  ligament  was doubly clamped, divided and tied with 0 Vicryl suture.  The  retroperitoneal space on that side was developed.  The course of the ureter  was well below.  The right IP ligament was isolated, doubly tied with a 0  Vicryl suture and cut.  The remainder of the ovary was dissected free with  the clamp and cut technique.  This was then irrigated and noted to be  hemostatic.  Course of the ureter on that side was well below the operative  site.  On the left, the same was repeated, isolating the left round  ligament, sharp and blunt dissection to free the ovary up from the left  pelvic sidewall.  Once this was accomplished, the retroperitoneal space on  the left side was developed. Course of the ureter was traced and was noted  to be well below the IP ligament.  The IP ligament was then clamp, divided  and doubly tied with 0 Dexon suture.  The remainder of the ovary was  dissected free with a clamp, cut and suture technique.  Pelvis was then  irrigated  with saline after both ovaries were removed.  The operative site  was hemostatic.  Prior to closure, sponge, needle and instrument counts were  reported as correct x2.  Perineum closed with running 2-0 Dexon suture.  The  rectus muscle reapproximated with 0 Dexon interrupted sutures.  A 0 PDS  suture was then used from laterally to midline on either side to  reapproximate the fascia.  Prior to complete closure, an Accufuser local  anesthetic pump was placed subfascially, brought out through a separate stab  incision and another one in the subcutaneous space.  After the fascia was  closed, subcutaneous tissue was irrigated and noted to be hemostatic.  Clips  and Steri-Strips were used on the skin.  The local anesthetic pumps were  then fused in each port with 4 to 5 mL of 1% Xylocaine and then hooked up to  the pump.   Attention directed to the posterior repair.  Small Allis clamps were then  placed at the mucocutaneous border.  A small triangle of perineal skin was  excised.  A dissection was carried up approximately half way up the  posterior vaginal vault.  The surrounding perirectal fascia was then  dissected free with sharp and blunt dissection.  Plicating sutures of 2-0  Dexon were then used to plicate the perirectal fascia in the midline.  Small  amount of excess mucosa was excised.  The mucosa was then reapproximated  with direct vision interrupted sutures and 4-0 Vicryl Rapide sutures on the  perineum.  Plain packing was then placed in the posterior vault. She  tolerated this well and went to the recovery room in good condition.                                               Richard M. Marcelle Overlie, M.D.    RMH/MEDQ  D:  11/03/2003  T:  11/03/2003  Job:  [id number]

## 2011-02-23 NOTE — Discharge Summary (Signed)
NAME:  [name] [name], [name] [name]              ACCOUNT NO.:  [id number]   MEDICAL RECORD NO.:  [id number]          PATIENT TYPE:  INP   LOCATION:  3038                         FACILITY:  [facility]   PHYSICIAN:  Payton Doughty, M.D.      DATE OF BIRTH:  17-Dec-1960   DATE OF ADMISSION:  10/08/2005  DATE OF DISCHARGE:  10/12/2005                                 DISCHARGE SUMMARY   ADMISSION DIAGNOSIS:  Brain tumor.   DISCHARGE DIAGNOSIS:  Brain tumor.   OPERATION:  Right temporal occipital craniotomy for tumor resection.   COMPLICATIONS:  None.   CONDITION ON DISCHARGE:  Status is alive and well.   HISTORY OF PRESENT ILLNESS:  This 50 year old, right-handed white girl whose  history and physical is recounted on the chart, has had headaches.  She has  had a CT in the emergency room at [facility] which showed a brain  tumor.  She was transferred to [facility].  An MRI showed  the right temporal occipital tumor.  Details of the history and physical are  in the chart.   HOSPITAL COURSE:  She was admitted on October 08, 2005, and on October 09, 2005, underwent a craniotomy for tumor resection.  Postoperatively she has  done well.  Her visual fields are now full.  She is awake, alert and  oriented.  Her incision is dry and well-healing.   DISPOSITION:  She is being discharged home in the care of her family on a  Medrol Dosepak.  She has had no evidence of seizure activity, so she has not  been started on Dilantin.   FOLLOWUP:  1.  Will be in the [facility]' Office in one week.  2.  The tumor type is a glioblastoma, and she is going to be followed by the      oncology service.           ______________________________  Payton Doughty, M.D.     MWR/MEDQ  D:  10/12/2005  T:  10/12/2005  Job:  [id number]

## 2011-02-23 NOTE — H&P (Signed)
NAME:  [name] [name], [name] [name]              ACCOUNT NO.:  [id number]   MEDICAL RECORD NO.:  [id number]          PATIENT TYPE:  INP   LOCATION:  3012                         FACILITY:  [facility]   PHYSICIAN:  Payton Doughty, M.D.      DATE OF BIRTH:  [birth year]   DATE OF ADMISSION:  10/08/2005  DATE OF DISCHARGE:                                HISTORY & PHYSICAL   ADMITTING DIAGNOSIS:  Brain tumor.   This is a 50 year old right-handed white female who I was called about.  She  has a history of migraines, been worse over the past couple of weeks and  much worse over the past few days.  She also noted that she had slightly  diminished vision in the left visual field.  The __________  CT showed a  right parietal occipital mass with a cystic component.   MEDICAL HISTORY:  Remarkable for migraines.   ALLERGIES:  She is allergic to SUDAFED and like medicines and so forth as  they give her some PVCs.   SURGICAL HISTORY:  Hysterectomy, bilateral salpingo-oophorectomy, and a  bladder tack.   She does not smoke.  She may drink very rarely.   FAMILY HISTORY:  There is no family history of brain tumors.   PHYSICAL EXAMINATION:  HEENT:  Within normal limits.  NECK:  Supple with no lymphadenopathy.  CHEST:  Clear.  CARDIAC:  Regular rate and rhythm without a murmur.  ABDOMEN:  Nontender with no hepatosplenomegaly.  EXTREMITIES:  Without clubbing or cyanosis.  GU:  Deferred.  PERIPHERAL PULSES:  Good.  NEUROLOGIC:  She is awake, alert, and oriented x3.  She has a left  homonymous hemianopsia.  Pupils are equal, round, reactive to light.  Extraocular movements are intact.  Facial movement and sensation are intact.  Tongue protrudes in the midline.  Shoulder shrug is normal.  She describes  no swallowing difficulties.  Motor exam demonstrates 5/5 strength throughout  the upper and lower extremities without a pronator drift.  Balance is  normal.  She says she has a headache at this time.  Toes are  downgoing  bilaterally.  Reflexes are intact.   She has on CT a heterogeneously enhancing right parietal occipital lesion  with obvious cystic components on CT.  There is about 7 mm of midline shift  and some associated edema with this lesion.   CLINICAL IMPRESSION:  Brain tumor.  The best guess would be a  hemangioblastoma, although a little bit difficult to characterize.  She  needs to have an MRI.  She needs to have steroids.  She needs to be admitted  and after 1-2 days of steroids this will be operated.           ______________________________  Payton Doughty, M.D.     MWR/MEDQ  D:  10/08/2005  T:  10/08/2005  Job:  [id number]

## 2011-02-23 NOTE — Discharge Summary (Signed)
NAME:  [name] [name], [name] [name]                        ACCOUNT NO.:  [id number]   MEDICAL RECORD NO.:  [id number]                   PATIENT TYPE:  INP   LOCATION:  9315                                 FACILITY:  [facility]   PHYSICIAN:  Duke Salvia. Marcelle Overlie, M.D.            DATE OF BIRTH:  08-14-1961   DATE OF ADMISSION:  11/03/2003  DATE OF DISCHARGE:                                 DISCHARGE SUMMARY   DISCHARGE DIAGNOSES:  1. Chronic pelvic pain.  2. Recurrent ovarian cyst.  3. Symptomatic rectocele.  4. Diagnostic laparoscopy followed by laparotomy with bilateral salpingo-     oophorectomy, lysis of adhesions, and posterior repair this admission.   SUMMARY OF THE HISTORY AND PHYSICAL EXAMINATION:  Please see admission H&P  for details.  Briefly, a 50 year old G2 P2 who had a prior TVH and anterior  repair, presents with history of recurrent ovarian cyst and pelvic pain with  symptomatic rectocele for repair.   HOSPITAL COURSE:  On November 03, 2003 under general anesthesia the patient  underwent laparoscopy, revealed dense bilateral adnexal adhesions.  This was  followed by a laparotomy with a BSO, lysis of adhesions, and posterior  repair.  Postoperative day #1 her vaginal pack was removed and she was  afebrile.  Her diet was advanced and she was allowed to ambulate.  Postoperative hemoglobin was 11.4.  By postoperative day #2 she remained  afebrile, the incision was clean and dry and she was ready for discharge at  that point.   LABORATORY DATA:  CBC on admission:  WBC 8.1, hemoglobin 14.1, hematocrit  41.8.  Blood type is A positive, antibody screen was negative.  Admission UA  was negative except for small hemoglobin.  Postoperative hemoglobin on  January 27 was 11.4.  Pathology report is still pending.   DISPOSITION:  The patient was discharged on Climara 0.06 skin patch, Tylox  p.r.n. pain.  Return to the office in 3 days to have the clips removed.  Advised to report any incisional  redness or drainage, increased pain or  bleeding, or fever over 101.   CONDITION:  Good.   ACTIVITY:  Gradually increase.                                               Richard M. Marcelle Overlie, M.D.    RMH/MEDQ  D:  11/05/2003  T:  11/05/2003  Job:  [id number]

## 2012-01-03 ENCOUNTER — Encounter: Payer: Self-pay | Admitting: Family Medicine

## 2012-01-07 ENCOUNTER — Encounter: Payer: Self-pay | Admitting: Family Medicine

## 2012-01-07 ENCOUNTER — Ambulatory Visit (INDEPENDENT_AMBULATORY_CARE_PROVIDER_SITE_OTHER): Payer: Commercial Managed Care - PPO | Admitting: Family Medicine

## 2012-01-07 VITALS — BP 122/94 | HR 76 | Temp 97.7°F | Wt 288.0 lb

## 2012-01-07 DIAGNOSIS — I10 Essential (primary) hypertension: Secondary | ICD-10-CM | POA: Insufficient documentation

## 2012-01-07 DIAGNOSIS — Z1211 Encounter for screening for malignant neoplasm of colon: Secondary | ICD-10-CM

## 2012-01-07 DIAGNOSIS — C719 Malignant neoplasm of brain, unspecified: Secondary | ICD-10-CM | POA: Insufficient documentation

## 2012-01-07 NOTE — Patient Instructions (Signed)
It was great to see you. Dr. Rhea Belton is with [facility]. Please call us back at your convenience about your colonoscopy.

## 2012-01-07 NOTE — Progress Notes (Signed)
Subjective:    Patient ID: [name] [name], female    DOB: 1961-05-11, 51 y.o.   MRN: [id number]  HPI  51 yo female here to establish care.  1.  GBM- s/p resection in 2007.  Followed by Dr. Maylon Peppers at [facility]. No recurrence and doing well.  Still receives infusions monthly (has porta cath).  2.  HTN- followed by Dr. Percival Spanish. On Lopressor 25 mg twice daily No further episodes of Tachycardia. Denies any CP, SOB, LE edema or blurred vision.  3.  H/o iron deficiency anemia- regular CBCs at [facility]. S/p IV infusion of iron. Doing well.  Patient Active Problem List  Diagnoses  . OVERWEIGHT  . MIGRAINE HEADACHE  . PREMATURE VENTRICULAR CONTRACTIONS  . OVARIAN CYST  . UNSPECIFIED TACHYCARDIA  . Hypertension  . Glioblastoma multiforme   Past Medical History  Diagnosis Date  . Migraine, unspecified, without mention of intractable migraine without mention of status migrainosus   . Overweight   . PVC (premature ventricular contraction)   . Tachycardia, unspecified   . Bilateral ovarian cysts   . Depression   . Hypertension   . Viral meningitis   . Microcytic anemia   . Glioblastoma multiforme 2007    Follow[facility] Dr[facility] Vedia Coffer at [facility]   Past Surgical History  Procedure Date  . Gyn surgery     hysterectomy  . Bilateral salpingoophorectomy   . Bladder tack   . Back surgery     lumbar  . Resection of glioblastoma multiform    History  Substance Use Topics  . Smoking status: Never Smoker   . Smokeless tobacco: Not on file  . Alcohol Use: No   Family History  Problem Relation Age of Onset  . Cancer Other     tongue  . Heart disease Mother     bypass age 62  . Heart disease Father     heart failure, atrial fib   Allergies  Allergen Reactions  . Morphine   . Pseudoephedrine    Current Outpatient Prescriptions on File Prior to Visit  Medication Sig Dispense Refill  . cyclobenzaprine (FLEXERIL) 10 MG tablet Take by mouth as directed      . esomeprazole  (NEXIUM) 40 MG capsule Take 40 mg by mouth daily before breakfast.      . estradiol (ESTRACE) 2 MG tablet Take 2 mg by mouth daily.      Marland Kitchen HYDROcodone-acetaminophen (VICODIN) 5-500 MG per tablet Take by mouth as needed.      Marland Kitchen LORazepam (ATIVAN) 1 MG tablet Take by mouth as needed      . meloxicam (MOBIC) 7.5 MG tablet Take by mouth as needed      . metoprolol tartrate (LOPRESSOR) 25 MG tablet Take 25 mg by mouth 2 (two) times daily.       The PMH, PSH, Social History, Family History, Medications, and allergies have been reviewed in [facility], and have been updated if relevant.   Review of Systems See HPI    Objective:   Physical Exam BP 122/94  Pulse 76  Temp(Src) 97.7 F (36.5 C) (Oral)  Wt 288 lb (130.636 kg)  General:  Well-developed,well-nourished,in no acute distress; alert,appropriate and cooperative throughout examination Head:  normocephalic and atraumatic.   Eyes:  vision grossly intact, pupils equal, pupils round, and pupils reactive to light.   Ears:  R ear normal and L ear normal.   Nose:  no external deformity.   Mouth:  good dentition.   Lungs:  Normal  respiratory effort, chest expands symmetrically. Lungs are clear to auscultation, no crackles or wheezes. Heart:  Normal rate and regular rhythm. S1 and S2 normal without gallop, murmur, click, rub or other extra sounds. Msk:  No deformity or scoliosis noted of thoracic or lumbar spine.   Extremities:  No clubbing, cyanosis, edema, or deformity noted with normal full range of motion of all joints.   Neurologic:  alert & oriented X3 and gait normal.   Skin:  Intact without suspicious lesions or rashes Psych:  Cognition and judgment appear intact. Alert and cooperative with normal attention span and concentration. No apparent delusions, illusions, hallucinations     Assessment & Plan:   1. Hypertension  Stable. Continue Metoprolol at current dose.   2. Glioblastoma multiforme  Doing remarkably well- no recurrence.   3.  Screening for colon cancer  Ambulatory referral to Gastroenterology

## 2012-01-09 ENCOUNTER — Encounter: Payer: Self-pay | Admitting: Gastroenterology

## 2012-02-01 ENCOUNTER — Ambulatory Visit (INDEPENDENT_AMBULATORY_CARE_PROVIDER_SITE_OTHER): Payer: 59 | Admitting: Gastroenterology

## 2012-02-01 ENCOUNTER — Encounter: Payer: Self-pay | Admitting: Gastroenterology

## 2012-02-01 VITALS — BP 124/80 | HR 76 | Ht 63.0 in | Wt 290.0 lb

## 2012-02-01 DIAGNOSIS — Z1211 Encounter for screening for malignant neoplasm of colon: Secondary | ICD-10-CM

## 2012-02-01 MED ORDER — MOVIPREP 100 G PO SOLR: 1.0000 | Freq: Once | ORAL | Status: DC

## 2012-02-01 NOTE — Patient Instructions (Addendum)
You will be set up for a colonoscopy at [facility], will plan not to use your Port for IV access.

## 2012-02-01 NOTE — Progress Notes (Signed)
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  She said we can use peripheral IV access instead.  Occasional constipatoin, especially with stress.  Foods also effect.  No overt Bleeding.  Review of systems: Pertinent positive and negative review of systems were noted in the above HPI section. Complete review of systems was performed and was otherwise normal.    Past Medical History  Diagnosis Date  . Migraine, unspecified, without mention of intractable migraine without mention of status migrainosus   . Overweight   . PVC (premature ventricular contraction)   . Tachycardia, unspecified   . Bilateral ovarian cysts   . Depression   . Hypertension   . Viral meningitis   . Microcytic anemia   . Glioblastoma multiforme 2007    Followed by Dr. Vedia Coffer at [facility]  . Anemia   . IBS (irritable bowel syndrome)     Past Surgical History  Procedure Date  . Abdominal hysterectomy   . Bilateral salpingoophorectomy   . Bladder surgery     tack  . Back surgery     lumbar  . Resection of glioblastoma multiform   . Portacath placement   . Cystocele repair   . Cesarean section     x 2     Current Outpatient Prescriptions  Medication Sig Dispense Refill  . cyclobenzaprine (FLEXERIL) 10 MG tablet Take by mouth as directed      . esomeprazole (NEXIUM) 40 MG capsule Take 40 mg by mouth daily before breakfast.      . estradiol (ESTRACE) 2 MG tablet Take 2 mg by mouth daily.      Marland Kitchen HYDROcodone-acetaminophen (VICODIN) 5-500 MG per tablet Take by mouth as needed.      Marland Kitchen LORazepam (ATIVAN) 1 MG tablet Take by mouth as needed      . meloxicam (MOBIC) 7.5 MG tablet Take 7.5 mg by mouth daily.       . metoprolol tartrate (LOPRESSOR) 25 MG tablet Take 25 mg by mouth 2 (two) times daily.        Allergies as of 02/01/2012 - Review Complete 02/01/2012  Allergen Reaction Noted  . Morphine    . Pseudoephedrine       Family History  Problem Relation Age of Onset  . Cancer Other     tongue  . Heart disease Mother     bypass age 29  . Heart disease Father     heart failure, atrial fib  . Diabetes Father   . Liver disease Brother   . Colon cancer Neg Hx     History   Social History  . Marital Status: Married    Spouse Name: N/A    Number of Children: N/A  . Years of Education: N/A   Occupational History  . Registered Nurse [place] History Main Topics  . Smoking status: Never Smoker   . Smokeless tobacco: Never Used  . Alcohol Use: Yes     Rare-Social   . Drug Use: No  . Sexually Active: Not on file   Other Topics Concern  . Not on file   Social History Narrative   Daily caffeine        Physical Exam: BP 124/80  Pulse 76  Ht 5\' 3"  (1.6 m)  Wt 290 lb (131.543 kg)  BMI 51.37 kg/m2 Constitutional: generally well-appearing Psychiatric: alert and oriented x3 Eyes:  extraocular movements intact Mouth: oral pharynx moist, no lesions Neck: supple no lymphadenopathy Cardiovascular: heart regular rate and rhythm Lungs: clear to auscultation bilaterally Abdomen: soft, nontender, nondistended, no obvious ascites, no peritoneal signs, normal bowel sounds Extremities: no lower extremity edema bilaterally Skin: no lesions on visible extremities    Assessment and plan: 51 y.o. female with  routine risk for colon cancer, currently being treated for glioblastoma, has had this cancer for 6 years now  Her brain cancer is remarkably completely stable. She is on biweekly chemotherapy for at and is otherwise in good health except for her morbid obesity. I think it is reasonable to proceed with full colonoscopy for cancer screening at her soonest convenience.

## 2012-02-06 ENCOUNTER — Ambulatory Visit (AMBULATORY_SURGERY_CENTER): Payer: 59 | Admitting: Gastroenterology

## 2012-02-06 ENCOUNTER — Encounter: Payer: Self-pay | Admitting: Gastroenterology

## 2012-02-06 VITALS — BP 137/71 | HR 62 | Temp 97.2°F | Resp 16 | Ht 63.0 in | Wt 290.0 lb

## 2012-02-06 DIAGNOSIS — Z1211 Encounter for screening for malignant neoplasm of colon: Secondary | ICD-10-CM

## 2012-02-06 MED ORDER — SODIUM CHLORIDE 0.9 % IV SOLN: 500.0000 mL | INTRAVENOUS | Status: DC

## 2012-02-06 NOTE — Patient Instructions (Addendum)
YOU HAD AN ENDOSCOPIC PROCEDURE TODAY AT THE [facility]: Refer to the procedure report that was given to you for any specific questions about what was found during the examination.  If the procedure report does not answer your questions, please call your gastroenterologist to clarify.  If you requested that your care partner not be given the details of your procedure findings, then the procedure report has been included in a sealed envelope for you to review at your convenience later.  YOU SHOULD EXPECT: Some feelings of bloating in the abdomen. Passage of more gas than usual.  Walking can help get rid of the air that was put into your GI tract during the procedure and reduce the bloating. If you had a lower endoscopy (such as a colonoscopy or flexible sigmoidoscopy) you may notice spotting of blood in your stool or on the toilet paper. If you underwent a bowel prep for your procedure, then you may not have a normal bowel movement for a few days.  DIET: Your first meal following the procedure should be a light meal and then it is ok to progress to your normal diet.  A half-sandwich or bowl of soup is an example of a good first meal.  Heavy or fried foods are harder to digest and may make you feel nauseous or bloated.  Likewise meals heavy in dairy and vegetables can cause extra gas to form and this can also increase the bloating.  Drink plenty of fluids but you should avoid alcoholic beverages for 24 hours.  ACTIVITY: Your care partner should take you home directly after the procedure.  You should plan to take it easy, moving slowly for the rest of the day.  You can resume normal activity the day after the procedure however you should NOT DRIVE or use heavy machinery for 24 hours (because of the sedation medicines used during the test).    SYMPTOMS TO REPORT IMMEDIATELY: A gastroenterologist can be reached at any hour.  During normal business hours, 8:30 AM to 5:00 PM Monday through Friday,  call [phone].  After hours and on weekends, please call the GI answering service at [phone] who will take a message and have the physician on call contact you.   Following lower endoscopy (colonoscopy or flexible sigmoidoscopy):  Excessive amounts of blood in the stool  Significant tenderness or worsening of abdominal pains  Swelling of the abdomen that is new, acute  Fever of 100F or higher  FOLLOW UP:  Our staff will call the home number listed on your records the next business day following your procedure to check on you and address any questions or concerns that you may have at that time regarding the information given to you following your procedure. This is a courtesy call and so if there is no answer at the home number and we have not heard from you through the emergency physician on call, we will assume that you have returned to your regular daily activities without incident.  SIGNATURES/CONFIDENTIALITY: You and/or your care partner have signed paperwork which will be entered into your electronic medical record.  These signatures attest to the fact that that the information above on your After Visit Summary has been reviewed and is understood.  Full responsibility of the confidentiality of this discharge information lies with you and/or your care-partner.  

## 2012-02-06 NOTE — Progress Notes (Deleted)
Patient did not experience any of the following events: a burn prior to discharge; a fall within the facility; wrong site/side/patient/procedure/implant event; or a hospital transfer or hospital admission upon discharge from the facility. (G8907)YOU HAD AN ENDOSCOPIC PROCEDURE TODAY AT THE [facility]: Refer to the procedure report that was given to you for any specific questions about what was found during the examination.  If the procedure report does not answer your questions, please call your gastroenterologist to clarify.  If you requested that your care partner not be given the details of your procedure findings, then the procedure report has been included in a sealed envelope for you to review at your convenience later.  YOU SHOULD EXPECT: Some feelings of bloating in the abdomen. Passage of more gas than usual.  Walking can help get rid of the air that was put into your GI tract during the procedure and reduce the bloating. If you had a lower endoscopy (such as a colonoscopy or flexible sigmoidoscopy) you may notice spotting of blood in your stool or on the toilet paper. If you underwent a bowel prep for your procedure, then you may not have a normal bowel movement for a few days.  DIET: Your first meal following the procedure should be a light meal and then it is ok to progress to your normal diet.  A half-sandwich or bowl of soup is an example of a good first meal.  Heavy or fried foods are harder to digest and may make you feel nauseous or bloated.  Likewise meals heavy in dairy and vegetables can cause extra gas to form and this can also increase the bloating.  Drink plenty of fluids but you should avoid alcoholic beverages for 24 hours.  ACTIVITY: Your care partner should take you home directly after the procedure.  You should plan to take it easy, moving slowly for the rest of the day.  You can resume normal activity the day after the procedure however you should NOT DRIVE or use  heavy machinery for 24 hours (because of the sedation medicines used during the test).    SYMPTOMS TO REPORT IMMEDIATELY: A gastroenterologist can be reached at any hour.  During normal business hours, 8:30 AM to 5:00 PM Monday through Friday, call [phone].  After hours and on weekends, please call the GI answering service at [phone] who will take a message and have the physician on call contact you.   Following lower endoscopy (colonoscopy or flexible sigmoidoscopy):  Excessive amounts of blood in the stool  Significant tenderness or worsening of abdominal pains  Swelling of the abdomen that is new, acute  Fever of 100F or higher  Following upper endoscopy (EGD)  Vomiting of blood or coffee ground material  New chest pain or pain under the shoulder blades  Painful or persistently difficult swallowing  New shortness of breath  Fever of 100F or higher  Black, tarry-looking stools  FOLLOW UP: If any biopsies were taken you will be contacted by phone or by letter within the next 1-3 weeks.  Call your gastroenterologist if you have not heard about the biopsies in 3 weeks.  Our staff will call the home number listed on your records the next business day following your procedure to check on you and address any questions or concerns that you may have at that time regarding the information given to you following your procedure. This is a courtesy call and so if there is no answer at the home   number and we have not heard from you through the emergency physician on call, we will assume that you have returned to your regular daily activities without incident.  SIGNATURES/CONFIDENTIALITY: You and/or your care partner have signed paperwork which will be entered into your electronic medical record.  These signatures attest to the fact that that the information above on your After Visit Summary has been reviewed and is understood.  Full responsibility of the confidentiality of this  discharge information lies with you and/or your care-partner. 

## 2012-02-06 NOTE — Progress Notes (Signed)
Patient did not experience any of the following events: a burn prior to discharge; a fall within the facility; wrong site/side/patient/procedure/implant event; or a hospital transfer or hospital admission upon discharge from the facility. (G8907) Patient did not have preoperative order for IV antibiotic SSI prophylaxis. (G8918)  

## 2012-02-06 NOTE — Op Note (Signed)
[facility] [address] [place], [place]  [address]  COLONOSCOPY PROCEDURE REPORT  PATIENT:  [name] [name], [name] [name]  MR#:  [id number] BIRTHDATE:  1960-12-28, 51 yrs. old  GENDER:  female ENDOSCOPIST:  Rachael Fee, MD REF. BY:  Ruthe Mannan, M.D. PROCEDURE DATE:  02/06/2012 PROCEDURE:  Colonoscopy 19147 ASA CLASS:  Class II INDICATIONS:  Routine Risk Screening MEDICATIONS:   propofol (Diprivan) 300 mg IV  DESCRIPTION OF PROCEDURE:   After the risks benefits and alternatives of the procedure were thoroughly explained, informed consent was obtained.  Digital rectal exam was performed and revealed no rectal masses.   The LB [id number]-[id number] endoscope was introduced through the anus and advanced to the cecum, which was identified by both the appendix and ileocecal valve, without limitations.  The quality of the prep was good..  The instrument was then slowly withdrawn as the colon was fully examined. <<PROCEDUREIMAGES>> FINDINGS:  A normal appearing cecum, ileocecal valve, and appendiceal orifice were identified. The ascending, hepatic flexure, transverse, splenic flexure, descending, sigmoid colon, and rectum appeared unremarkable (see image1, image2, and image3). Retroflexed views in the rectum revealed no abnormalities. COMPLICATIONS:  None  ENDOSCOPIC IMPRESSION: 1) Normal colon 2) No polyps or cancers  RECOMMENDATIONS: 1) You should continue to follow colorectal cancer screening guidelines for "routine risk" patients with a repeat colonoscopy in 10 years. There is no need for FOBT (stool) testing for at least 5 years.  REPEAT EXAM:  10 years  ______________________________ Rachael Fee, MD  n. eSIGNED:   Rachael Fee at 02/06/2012 09:16 AM  Curlene Dolphin, [phone]

## 2012-02-07 ENCOUNTER — Telehealth: Payer: Self-pay | Admitting: *Deleted

## 2012-02-07 NOTE — Telephone Encounter (Signed)
  Follow up Call-  Call back number 02/06/2012 02/06/2012  Post procedure Call Back phone  # - [phone]  Permission to leave phone message Yes No     Patient questions:  Do you have a fever, pain , or abdominal swelling? no Pain Score  0 *  Have you tolerated food without any problems? yes  Have you been able to return to your normal activities? yes  Do you have any questions about your discharge instructions: Diet   no Medications  no Follow up visit  no  Do you have questions or concerns about your Care? no  Actions: * If pain score is 4 or above: No action needed, pain <4.

## 2012-02-21 ENCOUNTER — Other Ambulatory Visit: Payer: Self-pay | Admitting: Obstetrics and Gynecology

## 2012-02-21 DIAGNOSIS — Z1231 Encounter for screening mammogram for malignant neoplasm of breast: Secondary | ICD-10-CM

## 2012-02-29 ENCOUNTER — Ambulatory Visit: Payer: 59

## 2012-03-04 ENCOUNTER — Ambulatory Visit: Payer: 59

## 2012-03-18 ENCOUNTER — Ambulatory Visit
Admission: RE | Admit: 2012-03-18 | Discharge: 2012-03-18 | Disposition: A | Discharge status: Home / Self Care | Payer: 59 | Source: Ambulatory Visit | Attending: Obstetrics and Gynecology | Admitting: Obstetrics and Gynecology

## 2012-03-18 DIAGNOSIS — Z1231 Encounter for screening mammogram for malignant neoplasm of breast: Secondary | ICD-10-CM

## 2012-06-06 ENCOUNTER — Telehealth: Payer: Self-pay | Admitting: Family Medicine

## 2012-06-06 NOTE — Telephone Encounter (Signed)
Caller: Mikinzie/Patient; Patient Name: [name] [name]; PCP: Ruthe Mannan [facility]); Best Callback Phone Number: [phone] (cell). 2nd call regarding needs order for [facility] cath to be flushed every 4-6 weeks at [facility].  She works in Training and development officer at [employer]. Dr Liana Crocker, oncologist at [facility] follow up and flush of Porta cath in October but needs it flushed at [facility]  During the 1st week of September and mid November 2013.  Research drug for glioblastoma was discontinued this summer.  Oncologist wants to leave [place] cath in place for 6 months.  Please fax order for portacath flushes to work number [phone], attention Darlene and she will take the order to get procedure done.  Advised Dr Dayton Martes out of office until 06/11/12; caller said OK to wait until MD returns to office. Information noted and sent to [facility] CAN Pool for follow up to recent contact, no triage required per Information Only Call, No Symptom Triage guideline.  OFFICE: Please call cell [phone] to advise when this has been done.

## 2012-06-11 NOTE — Telephone Encounter (Signed)
Rx written on my desk but not sure if these needs to be ordered differently. Shirlee Limerick, can you please look into this? Thanks, [employer]

## 2012-06-11 NOTE — Telephone Encounter (Signed)
Called the patient to let her know that Dr Dayton Martes doesn't feel comfortable writing the order for a Porta Cath being flushed at [facility] as she doesn't maintain porta caths or central lines. Patient understood and will call her Neurosurgeon Dr Venetia Maxon to see if he will write the order.

## 2012-06-11 NOTE — Telephone Encounter (Signed)
Noted! Thank you

## 2012-10-08 HISTORY — PX: BRAIN SURGERY: SHX531

## 2012-11-02 ENCOUNTER — Other Ambulatory Visit: Payer: Self-pay | Admitting: Radiation Oncology

## 2012-11-02 ENCOUNTER — Inpatient Hospital Stay
Admission: RE | Admit: 2012-11-02 | Discharge: 2012-11-02 | Disposition: A | Discharge status: Home / Self Care | Payer: Self-pay | Source: Ambulatory Visit | Attending: Radiation Oncology | Admitting: Radiation Oncology

## 2012-11-02 DIAGNOSIS — C719 Malignant neoplasm of brain, unspecified: Secondary | ICD-10-CM

## 2012-11-04 ENCOUNTER — Other Ambulatory Visit: Payer: Self-pay | Admitting: Radiation Therapy

## 2012-11-04 DIAGNOSIS — C719 Malignant neoplasm of brain, unspecified: Secondary | ICD-10-CM

## 2012-11-05 ENCOUNTER — Ambulatory Visit
Admission: RE | Admit: 2012-11-05 | Discharge: 2012-11-05 | Disposition: A | Discharge status: Home / Self Care | Payer: 59 | Source: Ambulatory Visit | Attending: Radiation Oncology | Admitting: Radiation Oncology

## 2012-11-05 ENCOUNTER — Encounter: Payer: Self-pay | Admitting: Radiation Oncology

## 2012-11-05 VITALS — BP 128/84 | HR 98 | Temp 98.2°F | Resp 20 | Ht 64.0 in | Wt 292.0 lb

## 2012-11-05 DIAGNOSIS — I10 Essential (primary) hypertension: Secondary | ICD-10-CM | POA: Insufficient documentation

## 2012-11-05 DIAGNOSIS — C719 Malignant neoplasm of brain, unspecified: Secondary | ICD-10-CM | POA: Insufficient documentation

## 2012-11-05 DIAGNOSIS — Z79899 Other long term (current) drug therapy: Secondary | ICD-10-CM | POA: Insufficient documentation

## 2012-11-05 HISTORY — DX: Other intervertebral disc degeneration, lumbar region: M51.36

## 2012-11-05 HISTORY — DX: Other intervertebral disc degeneration, lumbar region without mention of lumbar back pain or lower extremity pain: M51.369

## 2012-11-05 HISTORY — DX: Malignant neoplasm of brain, unspecified: C71.9

## 2012-11-05 NOTE — Progress Notes (Addendum)
Radiation Oncology         [phone] ________________________________  Initial outpatient Consultation  Name: [name] [name] MRN: [id number]  Date: 11/05/2012   DOB: [birth year]  QQ:[id number] Deborra Medina, MD  Erline Levine, MD   REFERRING PHYSICIAN: Erline Levine, MD  DIAGNOSIS: 52 yo woman with focal local recurrence of Glioblastoma Multiforme  HISTORY OF PRESENT ILLNESS::[name] [name] is a 52 y.o. female who underwent gross total resection of a right temporal-occipital Glioblastoma Multiforme on 10/09/2005 with Dr. Carloyn Manner.  She subsequently enrolled on NABTT 0306 trial at [facility] receiving radiotherapy and Temodar from 11/05/05 to 12/14/05 under Drs. Lesser and [employer].  Subsequently, she received Cilengitide x 80 cycles through 02/21/12, when the protocol was discontinued.  In follow-up with Dr. Maylon Peppers, MRI on 09/25/12 showed a focal recurrence which may be amenable to re-irradiation with stereotactic radiosurgery.  She was referred to Drs. Annie Main for [facility] consultation on Monday 11/10/12.  Through Dr. Rex Kras colleague, Dr. Vertell Limber, she requested an evaluation here today in order to consider pursuing stereotactic radiosurgery closer to home.  PREVIOUS RADIATION THERAPY: Yes as above  PAST MEDICAL HISTORY:  has a past medical history of Migraine, unspecified, without mention of intractable migraine without mention of status migrainosus; Overweight; PVC (premature ventricular contraction); Tachycardia, unspecified; Bilateral ovarian cysts; Viral meningitis; Glioblastoma multiforme (2007); IBS (irritable bowel syndrome); Depression; Brain cancer (10/09/05); DDD (degenerative disc disease), lumbar; Hypertension; Microcytic anemia; and Anemia.    PAST SURGICAL HISTORY: Past Surgical History  Procedure Date  . Abdominal hysterectomy   . Bilateral salpingoophorectomy   . Bladder surgery     tack  . Back surgery     lumbar  . Resection of glioblastoma multiform   . Portacath placement    . Cystocele repair   . Cesarean section     x 2     FAMILY HISTORY: family history includes Cancer in her other; Diabetes in her father; Heart disease in her father and mother; and Liver disease in her brother.  There is no history of Colon cancer.  SOCIAL HISTORY:  reports that she has never smoked. She has never used smokeless tobacco. She reports that she drinks alcohol. She reports that she does not use illicit drugs.  ALLERGIES: Morphine; Ondansetron; Pseudoephedrine; Red dye; Silver; and Tape  MEDICATIONS:  Current Outpatient Prescriptions  Medication Sig Dispense Refill  . rizatriptan (MAXALT) 10 MG tablet 10 mg. Take 1 tablet (10 mg total) by mouth as needed for Migraine. May repeat in 2 hours if needed      . cyclobenzaprine (FLEXERIL) 10 MG tablet Take 10 mg by mouth daily. Take by mouth as directed      . esomeprazole (NEXIUM) 40 MG capsule Take 40 mg by mouth daily before breakfast.      . estradiol (ESTRACE) 2 MG tablet Take 2 mg by mouth daily.      Marland Kitchen HYDROcodone-acetaminophen (VICODIN) 5-500 MG per tablet Take by mouth as needed.      Marland Kitchen LORazepam (ATIVAN) 1 MG tablet Take by mouth as needed      . meloxicam (MOBIC) 7.5 MG tablet Take 7.5 mg by mouth daily.       . metoprolol tartrate (LOPRESSOR) 25 MG tablet Take 25 mg by mouth 2 (two) times daily.      Marland Kitchen oxyCODONE-acetaminophen (PERCOCET/ROXICET) 5-325 MG per tablet Take 1 tablet by mouth every 4 (four) hours as needed.      . pantoprazole (PROTONIX) 40 MG tablet Take 40  mg by mouth daily.        REVIEW OF SYSTEMS:  A 15 point review of systems is documented in the electronic medical record. This was obtained by the nursing staff. However, I reviewed this with the patient to discuss relevant findings and make appropriate changes.  A comprehensive review of systems was negative.   PHYSICAL EXAM: VSS, NAD  Neuro: Alert and oriented x3. Strength is 5/5 in all four extremities. Gait is within normal limits. Speech is  fluent.  LABORATORY DATA:  Lab Results  Component Value Date   WBC 7.4 01/12/2009   HGB 12.3 01/12/2009   HCT 35.7* 01/12/2009   MCV 82.4 01/12/2009   PLT 281 01/12/2009   Lab Results  Component Value Date   NA 141 01/12/2009   K 4.4 01/12/2009   CL 107 01/12/2009   CO2 27 01/12/2009   Lab Results  Component Value Date   ALT 16 01/12/2009   AST 23 01/12/2009   ALKPHOS 98 01/12/2009   BILITOT 0.7 01/12/2009   X-Ray Results:  10/30/2012 MRI BRAIN WITH AND WITHOUT CONTRAST (BRAIN TUMOR PROTOCOL), Oct 30, 2012 03:57:00 PM . INDICATION: history of gbm, quick interval change191.2 Glioblastoma multiforme of temporal l COMPARISON: Multiple, most recent including MR brain from 09/24/2012. Marland Kitchen TECHNIQUE: Multiplanar, multi-sequence MR imaging of the entire brain was performed before and after intravenous administration of gadolinium-based contrast per brain tumor protocol. Marland Kitchen FINDINGS: . Skull/Marrow/Soft tissues: No focal marrow replacing lesions suggestive of neoplasm. Postsurgical changes of prior right parieto-occipital craniotomy. . Orbits/Optic nerves: No masses. . Sinuses: Clear . Brain: Surgical changes of prior resection of tumor from the right parieto-occipital region. The resection cavity is of a similar size and configuration and is within close proximity to the right occipital horn. There is a nodular area of increased FLAIR signal at the anterior/superior aspect of the resection cavity that is intervally increased in size and enhances. Additionally, on DCE dynamic contrast enhanced sequences there is increased signal on the Vp and Ve sequences suggesting that this is more likely vasculized tissue rather than enhancing scar. Additionally, along the lesser wing of the sphenoid on the right there is an unchanged small focus of extra-axial enhancement , that likely represents a confluence of the sphenoparietal sinus or a meningioma. No acute ischemia. No hemorrhage. No hydrocephalus.       IMPRESSION: 52 year old  7-year survivor of GBM with a new 7 mm focal recurrence along the edge of the surgical cavity.  She may be eligible for a variety of treatment options, including salvage resection, re-irradiation with conformal fractionated radiation, stereotactic radiosurgery, fractionated stereotactic radiosurgery, or systemic therapy.  PLAN:Today, I talked to the patient and family about the findings, work-up, and treatment thus far.  We discussed the natural history of disease and general treatment, highlighting the role or re-irradiation or stereotactic radiosurgery in the management.  We discussed the available radiation techniques, and focused on the details of logistics and delivery for linac based radiosurgery using our Brain[facility] system.  We reviewed the anticipated acute and late sequelae associated with radiation in this setting.  The patient was encouraged to ask questions that I answered to the best of my ability.  I filled out a patient counseling form during our discussion including treatment diagrams.  We retained a copy for our records.    The patient would like to proceed with consultation with the [facility] team at [facility] next week.  I will be happy to be involved  in her ongoing care, if she elects to pursue stereotactic radiosurgery closer to home.  I spent 60 minutes minutes face to face with the patient and more than 50% of that time was spent in counseling and/or coordination of care.    ------------------------------------------------  Sheral Apley. Tammi Klippel, M.D.

## 2012-11-05 NOTE — Progress Notes (Signed)
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

## 2012-11-05 NOTE — Progress Notes (Signed)
Please see the Nurse Progress Note in the MD Initial Consult Encounter for this patient. 

## 2012-11-06 ENCOUNTER — Telehealth: Payer: Self-pay | Admitting: *Deleted

## 2012-11-06 MED ORDER — ESOMEPRAZOLE MAGNESIUM 40 MG PO CPDR: 40.0000 mg | DELAYED_RELEASE_CAPSULE | Freq: Every day | ORAL | Status: DC

## 2012-11-06 NOTE — Addendum Note (Signed)
Encounter addended by: Oneita Hurt, MD on: 11/06/2012 10:49 AM<BR>     Documentation filed: Inpatient Notes, Notes Section

## 2012-11-06 NOTE — Progress Notes (Addendum)
  Radiation Oncology         [phone] ________________________________  Name: [name] [name] MRN: [id number]  Date: 11/06/12    DOB: 12-14-1960  Research Note  During her clinic visit yesterday, I forwarded her case information to our research office to assess whether she would be eligible for RTOG 1205.  This is a randomized phase II trial of concurrent bevacizumab and reirradiation versus bevacizumab alone as treatment for recurrent glioblastoma. After reviewing her records and discussing the case with the RTOG, it appears [name] would be eligible for this study.  She is scheduled to meet with the gamma knife team at [facility] area and if she elects to proceed with stereotactic radios[facility] or [facility], that may be a very good option for her with or without salvage surgical resection. If she declines surgery and stereotactic radiosurgery, she could be considered for trial enrollment on RTOG 1205.  _____________________________________  Artist Pais. Kathrynn Running, M.D.

## 2012-11-06 NOTE — Telephone Encounter (Signed)
Pharmacy faxed form asking to change patient from nexium to pantoprazole due to a zero copay for pantoprazole.  OK per Dr Dayton Martes.  Form faxed back to pharmacy.  Changed on med list.

## 2012-11-12 ENCOUNTER — Telehealth: Payer: Self-pay | Admitting: Oncology

## 2012-11-12 ENCOUNTER — Ambulatory Visit: Payer: 59 | Admitting: Radiation Oncology

## 2012-11-12 ENCOUNTER — Ambulatory Visit: Payer: 59

## 2012-11-12 NOTE — Telephone Encounter (Signed)
S/W in re NP appt 02/06 @ 12:30 w/Dr. Humphrey Rolls Dx- GBM

## 2012-11-13 ENCOUNTER — Other Ambulatory Visit: Payer: 59

## 2012-11-13 ENCOUNTER — Ambulatory Visit: Payer: 59

## 2012-11-13 ENCOUNTER — Telehealth: Payer: Self-pay | Admitting: Oncology

## 2012-11-13 ENCOUNTER — Ambulatory Visit (HOSPITAL_BASED_OUTPATIENT_CLINIC_OR_DEPARTMENT_OTHER): Payer: 59 | Admitting: Oncology

## 2012-11-13 ENCOUNTER — Encounter: Payer: Self-pay | Admitting: Oncology

## 2012-11-13 ENCOUNTER — Other Ambulatory Visit: Payer: 59 | Admitting: Lab

## 2012-11-13 VITALS — BP 160/90 | HR 78 | Temp 97.8°F | Resp 20 | Ht 64.0 in | Wt 290.9 lb

## 2012-11-13 DIAGNOSIS — C719 Malignant neoplasm of brain, unspecified: Secondary | ICD-10-CM

## 2012-11-13 DIAGNOSIS — C712 Malignant neoplasm of temporal lobe: Secondary | ICD-10-CM

## 2012-11-13 NOTE — Patient Instructions (Addendum)
Proceed with gamma knife  Move forwards with [facility] second opinion

## 2012-11-13 NOTE — Progress Notes (Signed)
Checked in new patient. No financial issues. °

## 2012-11-13 NOTE — Telephone Encounter (Signed)
C/D 11/13/12 for appt 11/13/12

## 2012-11-14 ENCOUNTER — Ambulatory Visit: Payer: 59 | Admitting: Radiation Oncology

## 2012-11-14 ENCOUNTER — Ambulatory Visit: Payer: 59

## 2012-11-19 ENCOUNTER — Ambulatory Visit: Payer: 59 | Admitting: Radiation Oncology

## 2012-11-19 ENCOUNTER — Ambulatory Visit: Admission: RE | Admit: 2012-11-19 | Payer: 59 | Source: Ambulatory Visit | Admitting: Radiation Oncology

## 2012-11-22 ENCOUNTER — Encounter (HOSPITAL_COMMUNITY): Payer: Self-pay | Admitting: Emergency Medicine

## 2012-11-22 ENCOUNTER — Emergency Department (HOSPITAL_COMMUNITY)
Admission: EM | Admit: 2012-11-22 | Discharge: 2012-11-23 | Disposition: A | Discharge status: Home / Self Care | Payer: 59 | Attending: Emergency Medicine | Admitting: Emergency Medicine

## 2012-11-22 DIAGNOSIS — G43909 Migraine, unspecified, not intractable, without status migrainosus: Secondary | ICD-10-CM | POA: Insufficient documentation

## 2012-11-22 DIAGNOSIS — Z8661 Personal history of infections of the central nervous system: Secondary | ICD-10-CM | POA: Insufficient documentation

## 2012-11-22 DIAGNOSIS — Z791 Long term (current) use of non-steroidal anti-inflammatories (NSAID): Secondary | ICD-10-CM | POA: Insufficient documentation

## 2012-11-22 DIAGNOSIS — E663 Overweight: Secondary | ICD-10-CM | POA: Insufficient documentation

## 2012-11-22 DIAGNOSIS — Z9889 Other specified postprocedural states: Secondary | ICD-10-CM | POA: Insufficient documentation

## 2012-11-22 DIAGNOSIS — Z79899 Other long term (current) drug therapy: Secondary | ICD-10-CM | POA: Insufficient documentation

## 2012-11-22 DIAGNOSIS — C719 Malignant neoplasm of brain, unspecified: Secondary | ICD-10-CM

## 2012-11-22 DIAGNOSIS — Z8719 Personal history of other diseases of the digestive system: Secondary | ICD-10-CM | POA: Insufficient documentation

## 2012-11-22 DIAGNOSIS — Z8742 Personal history of other diseases of the female genital tract: Secondary | ICD-10-CM | POA: Insufficient documentation

## 2012-11-22 DIAGNOSIS — C714 Malignant neoplasm of occipital lobe: Secondary | ICD-10-CM | POA: Insufficient documentation

## 2012-11-22 DIAGNOSIS — I10 Essential (primary) hypertension: Secondary | ICD-10-CM | POA: Insufficient documentation

## 2012-11-22 DIAGNOSIS — Z862 Personal history of diseases of the blood and blood-forming organs and certain disorders involving the immune mechanism: Secondary | ICD-10-CM | POA: Insufficient documentation

## 2012-11-22 DIAGNOSIS — R51 Headache: Secondary | ICD-10-CM | POA: Insufficient documentation

## 2012-11-22 DIAGNOSIS — Z8679 Personal history of other diseases of the circulatory system: Secondary | ICD-10-CM | POA: Insufficient documentation

## 2012-11-22 DIAGNOSIS — M5137 Other intervertebral disc degeneration, lumbosacral region: Secondary | ICD-10-CM | POA: Insufficient documentation

## 2012-11-22 DIAGNOSIS — M51379 Other intervertebral disc degeneration, lumbosacral region without mention of lumbar back pain or lower extremity pain: Secondary | ICD-10-CM | POA: Insufficient documentation

## 2012-11-22 MED ORDER — HYDROMORPHONE HCL PF 1 MG/ML IJ SOLN
1.0000 mg | Freq: Once | INTRAMUSCULAR | Status: AC
  Administered 2012-11-22: 1 mg via INTRAVENOUS
  Filled 2012-11-22: qty 1

## 2012-11-22 MED ORDER — OXYCODONE-ACETAMINOPHEN 5-325 MG PO TABS: 2.0000 | ORAL_TABLET | ORAL | Status: DC | PRN

## 2012-11-22 MED ORDER — PROMETHAZINE HCL 25 MG/ML IJ SOLN
25.0000 mg | Freq: Once | INTRAMUSCULAR | Status: AC
  Administered 2012-11-22: 25 mg via INTRAVENOUS
  Filled 2012-11-22: qty 1

## 2012-11-22 MED ORDER — SODIUM CHLORIDE 0.9 % IV BOLUS (SEPSIS)
1000.0000 mL | Freq: Once | INTRAVENOUS | Status: AC
  Administered 2012-11-22: 1000 mL via INTRAVENOUS

## 2012-11-22 MED ORDER — SODIUM CHLORIDE 0.9 % IV SOLN: Freq: Once | INTRAVENOUS | Status: DC

## 2012-11-22 NOTE — ED Notes (Signed)
Pt presents to the ED with a complaint of a headache.  Pt had a gamma laser knife surgery right cranial area on Thursday.  Pt was advised that she would have a headache but states" today it just got unbearable."

## 2012-11-22 NOTE — ED Notes (Signed)
[id number]<AV> Expected date:<BR> Expected time:<BR> Means of arrival:<BR> Comments:<BR> Expected pt

## 2012-11-26 NOTE — ED Provider Notes (Signed)
History     CSN: [id number]  Arrival date & time 11/22/12  2057   First MD Initiated Contact with Patient 11/22/12 2125      Chief Complaint  Patient presents with  . Headache    (Consider location/radiation/quality/duration/timing/severity/associated sxs/prior treatment) HPI... generalized headache for approximately one day.  Patient has recently had gamma knife laser surgery at [facility] for her brain tumor.  Decreased oral intake with associated nausea..  no neurological deficits. Pain is moderate to severe.  No stiff neck, visual changes.    Past Medical History  Diagnosis Date  . Migraine, unspecified, without mention of intractable migraine without mention of status migrainosus   . Overweight   . PVC (premature ventricular contraction)   . Tachycardia, unspecified   . Bilateral ovarian cysts   . Viral meningitis   . Glioblastoma multiforme 2007    Followed by Dr. Vedia Coffer at [facility]  . IBS (irritable bowel syndrome)   . Depression     Migraines  . Brain cancer 10/09/05    glioblastoma right temp/occipital  . DDD (degenerative disc disease), lumbar   . Hypertension     benign  . Microcytic anemia     iron def  2ndary chronic blood loss  . Anemia     Past Surgical History  Procedure Laterality Date  . Abdominal hysterectomy    . Bilateral salpingoophorectomy    . Bladder surgery      tack  . Back surgery      lumbar  . Resection of glioblastoma multiform    . Portacath placement    . Cystocele repair    . Cesarean section      x 2     Family History  Problem Relation Age of Onset  . Cancer Other     tongue  . Heart disease Mother     bypass age 77  . Heart disease Father     heart failure, atrial fib  . Diabetes Father   . Liver disease Brother   . Colon cancer Neg Hx     History  Substance Use Topics  . Smoking status: Never Smoker   . Smokeless tobacco: Never Used  . Alcohol Use: Yes     Comment: Rare-Social     OB History   Grav Para Term Preterm Abortions TAB SAB Ect Mult Living                  Review of Systems  All other systems reviewed and are negative.    Allergies  Morphine; Ondansetron; Pseudoephedrine; Red dye; Silver; and Tape  Home Medications   Current Outpatient Rx  Name  Route  Sig  Dispense  Refill  . cyclobenzaprine (FLEXERIL) 10 MG tablet   Oral   Take 10 mg by mouth daily. Take by mouth as directed         . estradiol (ESTRACE) 2 MG tablet   Oral   Take 2 mg by mouth daily.         Marland Kitchen HYDROcodone-acetaminophen (VICODIN) 5-500 MG per tablet      Take by mouth as needed.         Marland Kitchen LORazepam (ATIVAN) 1 MG tablet   Oral   Take 1 mg by mouth every 6 (six) hours as needed. For anxiety         . meloxicam (MOBIC) 7.5 MG tablet   Oral   Take 7.5 mg by mouth daily as needed. For back pain         .  metoprolol tartrate (LOPRESSOR) 25 MG tablet   Oral   Take 25 mg by mouth 2 (two) times daily.         Marland Kitchen oxyCODONE-acetaminophen (PERCOCET/ROXICET) 5-325 MG per tablet   Oral   Take 1 tablet by mouth every 4 (four) hours as needed.         . pantoprazole (PROTONIX) 40 MG tablet      Take by mouth. Take 40 mg by mouth daily.         . rizatriptan (MAXALT) 10 MG tablet      10 mg. Take 1 tablet (10 mg total) by mouth as needed for Migraine. May repeat in 2 hours if needed         . oxyCODONE-acetaminophen (PERCOCET) 5-325 MG per tablet   Oral   Take 2 tablets by mouth every 4 (four) hours as needed for pain.   20 tablet   0     BP 130/70  Pulse 85  Temp(Src) 98 F (36.7 C) (Oral)  Resp 18  SpO2 96%  Physical Exam  Nursing note and vitals reviewed. Constitutional: She is oriented to person, place, and time.  Dehydrated, no neurological deficits  HENT:  Head: Normocephalic and atraumatic.  Eyes: Conjunctivae and EOM are normal. Pupils are equal, round, and reactive to light.  Neck: Normal range of motion. Neck supple.  Cardiovascular: Normal  rate, regular rhythm and normal heart sounds.   Pulmonary/Chest: Effort normal and breath sounds normal.  Abdominal: Soft. Bowel sounds are normal.  Musculoskeletal: Normal range of motion.  Neurological: She is alert and oriented to person, place, and time.  Skin: Skin is warm and dry.  Psychiatric: She has a normal mood and affect.    ED Course  Procedures (including critical care time)  Labs Reviewed - No data to display No results found.   1. Headache   2. Glioblastoma multiforme       MDM  Patient feels much better after 2 L of IV fluids, antinausea medication. She understands to return if worse.          Nat Christen, MD 11/26/12 [phone]

## 2012-12-04 NOTE — Progress Notes (Signed)
[facility]  [phone] [phone]  MEDICAL ONCOLOGY - INITIAL CONSULATION    Referral MD  Dr. Margaretmary Dys  Reason for Referral: 52 year old female with prior history of right temple oral except for a glioblastoma multiforme diagnosed 10/09/2005 now with focal local recurrence.  Chief Complaint  Patient presents with  . New Evaluation  : recurrent glioblastoma multi-forme  HPI: patient is a very pleasant 52 year old female who underwent a gross total resection of a right temporal occipital glioblastoma  multi-forme in 10/09/2005 with Dr. Channing Mutters.She subsequently was [facility] and was enrolled on and a NABTT 0306 trial receiving radiotherapy and Temodar from 11/05/2005 to December 14 2698 Dr. Kayleen Memos and Alphonsa Gin . She is also received Cilengitide x 80 cycles through 02/21/2012 thereafter the protocol was discontinued. She was seen by Dr. Kayleen Memos and had MRI on 09/25/2012 that showed a focal recurrence which was possibly amenable to re irradiation with stereotactic radiosurgery surgery. She was referred to Drs. Johny Drilling and Programmer, systems for [employer] consultation on 11/10/2012. Patient also has an upcoming appointment at [facility] neuro-oncology. She is seen in medical oncology to establish her oncologic care.she is without any complaints.   Past Medical History  Diagnosis Date  . Migraine, unspecified, without mention of intractable migraine without mention of status migrainosus   . Overweight   . PVC (premature ventricular contraction)   . Tachycardia, unspecified   . Bilateral ovarian cysts   . Viral meningitis   . Glioblastoma multiforme 2007    Followed by Dr. Liana Crocker at [facility]  . IBS (irritable bowel syndrome)   . Depression     Migraines  . Brain cancer 10/09/05    glioblastoma right temp/occipital  . DDD (degenerative disc disease), lumbar   . Hypertension     benign  . Microcytic anemia    iron def  2ndary chronic blood loss  . Anemia   :  Past Surgical History  Procedure Laterality Date  . Abdominal hysterectomy    . Bilateral salpingoophorectomy    . Bladder surgery      tack  . Back surgery      lumbar  . Resection of glioblastoma multiform    . Portacath placement    . Cystocele repair    . Cesarean section      x 2   :  Current Outpatient Prescriptions  Medication Sig Dispense Refill  . cyclobenzaprine (FLEXERIL) 10 MG tablet Take 10 mg by mouth daily. Take by mouth as directed      . estradiol (ESTRACE) 2 MG tablet Take 2 mg by mouth daily.      Marland Kitchen HYDROcodone-acetaminophen (VICODIN) 5-500 MG per tablet Take by mouth as needed.      Marland Kitchen LORazepam (ATIVAN) 1 MG tablet Take 1 mg by mouth every 6 (six) hours as needed. For anxiety      . meloxicam (MOBIC) 7.5 MG tablet Take 7.5 mg by mouth daily as needed. For back pain      . metoprolol tartrate (LOPRESSOR) 25 MG tablet Take 25 mg by mouth 2 (two) times daily.      Marland Kitchen oxyCODONE-acetaminophen (PERCOCET/ROXICET) 5-325 MG per tablet Take 1 tablet by mouth every 4 (four) hours as needed.      . pantoprazole (PROTONIX) 40 MG tablet Take by mouth. Take 40 mg by mouth daily.      . rizatriptan (MAXALT) 10 MG tablet 10 mg. Take 1 tablet (10 mg total)  by mouth as needed for Migraine. May repeat in 2 hours if needed      . oxyCODONE-acetaminophen (PERCOCET) 5-325 MG per tablet Take 2 tablets by mouth every 4 (four) hours as needed for pain.  20 tablet  0   No current facility-administered medications for this visit.     Allergies  Allergen Reactions  . Morphine     bladder spasms  . Ondansetron     Severe headache  . Pseudoephedrine   . Red Dye Rash  . Silver Rash    Tegaderm  . Tape Rash    Adhesive silicone  :  Family History  Problem Relation Age of Onset  . Cancer Other     tongue  . Heart disease Mother     bypass age 50  . Heart disease Father     heart failure, atrial fib  . Diabetes Father   .  Liver disease Brother   . Colon cancer Neg Hx   :  History   Social History  . Marital Status: Married    Spouse Name: N/A    Number of Children: N/A  . Years of Education: N/A   Occupational History  . 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 Smoking status: Never Smoker   . Smokeless tobacco: Never Used  . Alcohol Use: Yes     Comment: Rare-Social   . Drug Use: No  . Sexually Active: Yes   Other Topics Concern  . Not on file   Social History Narrative   Daily caffeine   :  A comprehensive review of systems was negative.  Exam: Filed Vitals:   11/13/12 1301  BP: 160/90  Pulse: 78  Temp: 97.8 F (36.6 C)  Resp: 20  Height: 5\' 4"  (1.626 m)  Weight: 290 lb 14.4 oz (131.951 kg)     General:  well-nourished in no acute distress.  Eyes:  no scleral icterus.  ENT:  There were no oropharyngeal lesions.  Neck was without thyromegaly.  Lymphatics:  Negative cervical, supraclavicular or axillary adenopathy.  Respiratory: lungs were clear bilaterally without wheezing or crackles.  Cardiovascular:  Regular rate and rhythm, S1/S2, without murmur, rub or gallop.  There was no pedal edema.  GI:  abdomen was soft, flat, nontender, nondistended, without organomegaly.  Muscoloskeletal:  no spinal tenderness of palpation of vertebral spine.  Skin exam was without echymosis, petichae.  Neuro exam was nonfocal.  Patient was able to get on and off exam table without assistance.  Gait was normal.  Patient was alerted and oriented.  Attention was good.   Language was appropriate.  Mood was normal without depression.  Speech was not pressured.  Thought content was not tangential.     Lab Results  Component Value Date   WBC 7.4 01/12/2009   HGB 12.3 01/12/2009   HCT 35.7* 01/12/2009   PLT 281 01/12/2009   GLUCOSE 83 01/12/2009   ALT 16 01/12/2009   AST 23 01/12/2009   NA 141 01/12/2009   K 4.4 01/12/2009   CL 107 01/12/2009   CREATININE 0.68 01/12/2009   BUN 9 01/12/2009   CO2 27  01/12/2009   INR 1.0 01/12/2009      Assessment and Plan: 52 year old female with focal local recurrence of glioblastoma multiforme, patient is currently clinically asymptomatic but she is a good candidate for stereotactic reirradiation with gamma knife. Patient is gong to be seen back at [place] for discussion of this. She also  has an appointment set up to be seen at [facility]. Patient and I discussed her systemic treatment options we could possibly include doing Temodar as well as Avastin. We certainly would discuss this further once she has had her gamma knife at [facility]. She also is encouraged to keep her appointment at [facility] today. All questions were answered. I spent 60 minutes with the patient in face to face contact with 50% of the time for counseling.  Marcy Panning, MD Medical/Oncology [facility] [phone] (beeper) [phone] (Office)

## 2012-12-05 ENCOUNTER — Encounter: Payer: Self-pay | Admitting: *Deleted

## 2012-12-05 NOTE — Progress Notes (Signed)
[facility] Psychosocial Distress Screening Clinical Social Work  Clinical Social Work was referred by distress screening protocol.  The patient scored a 8 on the Psychosocial Distress Thermometer which indicates severe distress. Clinical Social Worker attempted to contact patient to assess for distress and other psychosocial needs. CSW unable to reach patient and requested patient return call when convenient.  Kathrin Penner, MSW, LCSW Clinical Social Worker [facility] [phone]

## 2012-12-18 ENCOUNTER — Ambulatory Visit: Payer: 59 | Admitting: Oncology

## 2012-12-19 ENCOUNTER — Telehealth: Payer: Self-pay | Admitting: Emergency Medicine

## 2012-12-19 NOTE — Telephone Encounter (Signed)
Set her up to see me first week of April

## 2012-12-19 NOTE — Telephone Encounter (Signed)
Called patient concerning rescheduling appointment with Dr Welton Flakes.  Patient is to see her Oncologist at [facility] on 3/20 and would like to follow up with Dr Welton Flakes the week afterwards. Patient encouraged to call with any concerns. No questions or complaints voiced at this time.

## 2012-12-26 ENCOUNTER — Other Ambulatory Visit: Payer: Self-pay | Admitting: Emergency Medicine

## 2012-12-26 ENCOUNTER — Telehealth: Payer: Self-pay | Admitting: Emergency Medicine

## 2012-12-26 NOTE — Telephone Encounter (Signed)
Spoke with patient about future f/u with Dr Welton Flakes. Patient states she is to have an MRI at [facility] on 3/27 and that [facility] has referred her to [facility] to control her headaches.   Patient denies any further concerns.

## 2012-12-30 ENCOUNTER — Other Ambulatory Visit: Payer: Self-pay | Admitting: Medical Oncology

## 2012-12-30 ENCOUNTER — Telehealth: Payer: Self-pay | Admitting: Oncology

## 2012-12-30 NOTE — Telephone Encounter (Signed)
, °

## 2012-12-30 NOTE — Telephone Encounter (Signed)
Sent dr Welton Flakes an email regarding a f/u spot for this pt in April.

## 2013-01-09 ENCOUNTER — Telehealth: Payer: Self-pay | Admitting: Medical Oncology

## 2013-01-09 DIAGNOSIS — C719 Malignant neoplasm of brain, unspecified: Secondary | ICD-10-CM

## 2013-01-09 NOTE — Telephone Encounter (Signed)
Patient called stating she needed to have labs (CBC, CMP and U.A) as well her PAC flushed prior to her appt with Dr Kayleen Memos at [facility] (sched for 1:00) who is also requesting to have these labs drawn. Per NP ok to sched. Patient with flush appt @ 0900 and labs @ 0845. Patient informed of appts, no questions at this time.

## 2013-01-12 ENCOUNTER — Ambulatory Visit: Payer: 59 | Admitting: Lab

## 2013-01-12 ENCOUNTER — Ambulatory Visit: Payer: 59

## 2013-01-12 ENCOUNTER — Other Ambulatory Visit (HOSPITAL_BASED_OUTPATIENT_CLINIC_OR_DEPARTMENT_OTHER): Payer: 59 | Admitting: Lab

## 2013-01-12 VITALS — BP 142/89 | HR 77 | Temp 97.0°F | Resp 20

## 2013-01-12 DIAGNOSIS — C712 Malignant neoplasm of temporal lobe: Secondary | ICD-10-CM

## 2013-01-12 DIAGNOSIS — C719 Malignant neoplasm of brain, unspecified: Secondary | ICD-10-CM

## 2013-01-12 LAB — URINALYSIS, MICROSCOPIC - CHCC
Protein: NEGATIVE mg/dL
Specific Gravity, Urine: 1.025 (ref 1.003–1.035)
Urobilinogen, UR: 0.2 mg/dL (ref 0.2–1)
pH: 6 (ref 4.6–8.0)

## 2013-01-12 LAB — COMPREHENSIVE METABOLIC PANEL (CC13)
ALT: 17 U/L (ref 0–55)
AST: 19 U/L (ref 5–34)
Albumin: 3.2 g/dL — ABNORMAL LOW (ref 3.5–5.0)
Alkaline Phosphatase: 87 U/L (ref 40–150)
BUN: 9.5 mg/dL (ref 7.0–26.0)
Calcium: 9.6 mg/dL (ref 8.4–10.4)
Chloride: 105 mEq/L (ref 98–107)
Potassium: 4.5 mEq/L (ref 3.5–5.1)
Sodium: 141 mEq/L (ref 136–145)
Total Protein: 6.5 g/dL (ref 6.4–8.3)

## 2013-01-12 LAB — CBC WITH DIFFERENTIAL/PLATELET
BASO%: 0.4 % (ref 0.0–2.0)
Basophils Absolute: 0 10*3/uL (ref 0.0–0.1)
EOS%: 1.6 % (ref 0.0–7.0)
HCT: 40.2 % (ref 34.8–46.6)
HGB: 13.6 g/dL (ref 11.6–15.9)
LYMPH%: 29.3 % (ref 14.0–49.7)
MCH: 29.2 pg (ref 25.1–34.0)
MCHC: 33.8 g/dL (ref 31.5–36.0)
MONO#: 0.6 10*3/uL (ref 0.1–0.9)
NEUT%: 62.4 % (ref 38.4–76.8)
Platelets: 244 10*3/uL (ref 145–400)

## 2013-01-12 MED ORDER — HEPARIN SOD (PORK) LOCK FLUSH 100 UNIT/ML IV SOLN
500.0000 [IU] | Freq: Once | INTRAVENOUS | Status: AC
  Administered 2013-01-12: 500 [IU] via INTRAVENOUS
  Filled 2013-01-12: qty 5

## 2013-01-12 MED ORDER — SODIUM CHLORIDE 0.9 % IJ SOLN
10.0000 mL | INTRAMUSCULAR | Status: DC | PRN
  Administered 2013-01-12: 10 mL via INTRAVENOUS
  Filled 2013-01-12: qty 10

## 2013-01-13 ENCOUNTER — Ambulatory Visit (INDEPENDENT_AMBULATORY_CARE_PROVIDER_SITE_OTHER): Payer: 59 | Admitting: Neurology

## 2013-01-13 ENCOUNTER — Encounter: Payer: Self-pay | Admitting: Neurology

## 2013-01-13 VITALS — BP 141/87 | HR 74 | Ht 65.0 in | Wt 292.0 lb

## 2013-01-13 DIAGNOSIS — G43909 Migraine, unspecified, not intractable, without status migrainosus: Secondary | ICD-10-CM

## 2013-01-13 MED ORDER — AMITRIPTYLINE HCL 10 MG PO TABS: 30.0000 mg | ORAL_TABLET | Freq: Every day | ORAL | Status: DC

## 2013-01-13 NOTE — Patient Instructions (Signed)
  Begin amitrityline at 10 mg at night for one week, then take 20 mg at night for one week, then take 30 mg at night. Call if you are tolerating, but you need more medications (higher dose).  Start Magnesium oxide 250 mg a day, riboflavin 400 mg daily.

## 2013-01-13 NOTE — Progress Notes (Signed)
Reason for visit: Headache  [name] [name] is a 52 y.o. female  History of present illness:  [name] [name] is a 52 year old right-handed white female with a history of a glioblastoma affecting the right temporal and occipital areas of the brain. The patient has recently had a gamma knife procedure for this, and she has had an increase in her headache severity since that time. The patient indicates that she has a history of migraine since she had onset of her menstrual cycle. The patient had occasional headaches throughout the early part of her life, but when she turned 22, the headaches became daily in nature. The patient was treated for migraine headaches through the [facility] for number of years. The patient indicates that in the past she has responded somewhat to Topamax. The patient was recently started on Topamax again at 50 mg, but this resulted in significant drowsiness and confusion. The patient has come off the medication. The patient was on dexamethasone following Gamma Knife procedure, and this offered some benefit. The patient in the past has had a lot of visual aura with her headache with a strobe light effect, and visual distortions. The patient indicates that her headaches often times are in the back of the head, but they may be frontal in nature. The patient currently indicates that her headaches usually are worse in the morning, better as the day goes on. The patient describes a squeezing bandlike sensation around her head, and occasionally the headaches may be throbbing in nature. The patient still gets some visual aura and some nausea and occasional vomiting with the headache. The patient is missing a lot of work because of the headaches and because she feels as if her judgment has been altered since the gamma knife procedure. The patient has not had any seizure events. Recent MRI evaluations have been done through [facility], and the patient was told that the  glioblastoma appears to be in remission. There is no edema of the brain following the gamma knife procedure. The patient does report some neck stiffness. The patient is sent to this office for further evaluation. The patient recently was placed back on a low dose of dexamethasone by her primary care physician.  Past Medical History  Diagnosis Date  . Migraine, unspecified, without mention of intractable migraine without mention of status migrainosus   . Overweight   . PVC (premature ventricular contraction)   . Tachycardia, unspecified   . Bilateral ovarian cysts   . Viral meningitis   . Glioblastoma multiforme 2007    Followed by Dr. Vedia Coffer at [facility]  . IBS (irritable bowel syndrome)   . Depression     Migraines  . Brain cancer 10/09/05    glioblastoma right temp/occipital  . DDD (degenerative disc disease), lumbar   . Hypertension     benign  . Microcytic anemia     iron def  2ndary chronic blood loss  . Anemia     Past Surgical History  Procedure Laterality Date  . Abdominal hysterectomy    . Bilateral salpingoophorectomy    . Bladder surgery      tack  . Back surgery      lumbar  . Resection of glioblastoma multiform    . Portacath placement    . Cystocele repair    . Cesarean section      x 2     Family History  Problem Relation Age of Onset  . Cancer Other     tongue  .  Heart disease Mother     bypass age 44  . Heart disease Father     heart failure, atrial fib  . Diabetes Father   . Liver disease Brother   . Diabetes Brother   . Colon cancer Neg Hx   . Diabetes Sister     Social history:  reports that she has never smoked. She has never used smokeless tobacco. She reports that  drinks alcohol. She reports that she does not use illicit drugs.  Medications:  Current Outpatient Prescriptions on File Prior to Visit  Medication Sig Dispense Refill  . estradiol (ESTRACE) 2 MG tablet Take 2 mg by mouth daily.      Marland Kitchen HYDROcodone-acetaminophen (VICODIN)  5-500 MG per tablet Take by mouth as needed.      Marland Kitchen LORazepam (ATIVAN) 1 MG tablet Take 1 mg by mouth every 6 (six) hours as needed. For anxiety      . metoprolol tartrate (LOPRESSOR) 25 MG tablet Take 25 mg by mouth 2 (two) times daily.      Marland Kitchen oxyCODONE-acetaminophen (PERCOCET) 5-325 MG per tablet Take 2 tablets by mouth every 4 (four) hours as needed for pain.  20 tablet  0  . pantoprazole (PROTONIX) 40 MG tablet Take by mouth. Take 40 mg by mouth daily.      . rizatriptan (MAXALT) 10 MG tablet 10 mg. Take 1 tablet (10 mg total) by mouth as needed for Migraine. May repeat in 2 hours if needed       No current facility-administered medications on file prior to visit.    Allergies:  Allergies  Allergen Reactions  . Morphine     bladder spasms  . Ondansetron     Severe headache  . Pseudoephedrine   . Red Dye Rash  . Silver Rash    Tegaderm  . Tape Rash    Adhesive silicone    ROS:  Out of a complete 14 system review of symptoms, the patient complains only of the following symptoms, and all other reviewed systems are negative.  Fatigue Itching Headache, memory loss Insomnia, snoring Depression Flushing sensations  Blood pressure 141/87, pulse 74, height 5\' 5"  (1.651 m), weight 292 lb (132.45 kg).  Physical Exam  General: The patient is alert and cooperative at the time of the examination. The patient is markedly obese.  Head: Pupils are equal, round, and reactive to light. Discs are flat bilaterally. No venous pulsations are seen.  Neck: The neck is supple, no carotid bruits are noted.  Respiratory: The respiratory examination is clear.  Cardiovascular: The cardiovascular examination reveals a regular rate and rhythm, no obvious murmurs or rubs are noted.  Skin: Extremities are without significant edema.  Neurologic Exam  Mental status:  Cranial nerves: Facial symmetry is present. There is good sensation of the face to pinprick and soft touch bilaterally. The  strength of the facial muscles and the muscles to head turning and shoulder shrug are normal bilaterally. Speech is well enunciated, no aphasia or dysarthria is noted. Extraocular movements are full. Visual fields are full.  Motor: The motor testing reveals 5 over 5 strength of all 4 extremities. Good symmetric motor tone is noted throughout.  Sensory: Sensory testing is intact to pinprick, soft touch, vibration sensation, and position sense on all 4 extremities. No evidence of extinction is noted.  Coordination: Cerebellar testing reveals good finger-nose-finger and heel-to-shin bilaterally.  Gait and station: Gait is normal. Tandem gait is slightly unsteady. Romberg is negative. No drift is seen  Reflexes: Deep tendon reflexes are symmetric, but are depressed bilaterally. Ankle jerks are absent bilaterally. Toes are downgoing bilaterally.   Assessment/Plan:  1. Chronic daily headache  2. Glioblastoma, right temporal occipital  The patient likely has a blend of migraine and muscle tension headache. The patient reports a squeezing bandlike sensation around the head. The patient also has occasional throbbing headaches, visual aura, and nausea and vomiting that likely represent migraine. The patient will be started on low-dose amitriptyline, and the dose will be increased gradually over time. The patient will go back on dexamethasone. The patient was given a prescription for Zoloft, but this is unlikely to help her headaches. The patient will followup in 3 months. The patient will go on magnesium oxide supplementation and riboflavin.  Jill Alexanders MD 01/13/2013 8:35 PM  [facility] [address] [place], [place] [address]  Phone [phone] Fax [phone]

## 2013-01-16 ENCOUNTER — Encounter: Payer: Self-pay | Admitting: Oncology

## 2013-01-16 ENCOUNTER — Ambulatory Visit (HOSPITAL_BASED_OUTPATIENT_CLINIC_OR_DEPARTMENT_OTHER): Payer: 59 | Admitting: Oncology

## 2013-01-16 ENCOUNTER — Telehealth: Payer: Self-pay | Admitting: *Deleted

## 2013-01-16 VITALS — BP 137/90 | HR 72 | Temp 98.0°F | Resp 20 | Ht 65.0 in | Wt 292.9 lb

## 2013-01-16 DIAGNOSIS — C719 Malignant neoplasm of brain, unspecified: Secondary | ICD-10-CM

## 2013-01-16 DIAGNOSIS — C712 Malignant neoplasm of temporal lobe: Secondary | ICD-10-CM

## 2013-01-16 NOTE — Patient Instructions (Addendum)
Keep follow up with [facility]  I will see you back in 6 months

## 2013-01-16 NOTE — Telephone Encounter (Signed)
appts made and printed 

## 2013-01-29 ENCOUNTER — Other Ambulatory Visit: Payer: Self-pay | Admitting: Family Medicine

## 2013-01-29 MED ORDER — METOPROLOL TARTRATE 25 MG PO TABS: 25.0000 mg | ORAL_TABLET | Freq: Two times a day (BID) | ORAL | Status: DC

## 2013-02-10 NOTE — Progress Notes (Signed)
OFFICE PROGRESS NOTE  CC  [name] Norris, MD [facility] [address] [place] [address]  DIAGNOSIS: 53 year old female with prior history of right temperol for a glioblastoma multiforme diagnosed 10/09/2005 now with focal local recurrence.  PRIOR THERAPY: #1underwent a gross total resection of a right temporal occipital glioblastoma multi-forme in 10/09/2005 with Dr. Carloyn Manner.She subsequently was seen at [facility] and was enrolled on and a NABTT 0306 trial receiving radiotherapy and Temodar from 11/05/2005 to December 14 2698 Dr. Maylon Peppers and Jamse Arn . She is also received Cilengitide x 80 cycles through 02/21/2012 thereafter the protocol was discontinued. She was seen by Dr. Maylon Peppers and had MRI on 09/25/2012 that showed a focal recurrence which was possibly amenable to re irradiation with stereotactic radiosurgery surgery. She was referred to Drs. Vallarie Mare and Armed forces training and education officer for [employer] consultation on 11/10/2012.  #2 patient is now status post SRS. She tolerated it well. Unfortunate she is having chronic headache she's been seen by Dr. Jannifer Franklin.   CURRENT THERAPY:observation  INTERVAL HISTORY: [name] [name] 52 y.o. female returns for   MEDICAL HISTORY: Past Medical History  Diagnosis Date  . Migraine, unspecified, without mention of intractable migraine without mention of status migrainosus   . Overweight   . PVC (premature ventricular contraction)   . Tachycardia, unspecified   . Bilateral ovarian cysts   . Viral meningitis   . Glioblastoma multiforme 2007    Followed by Dr. Vedia Coffer at [facility]  . IBS (irritable bowel syndrome)   . Depression     Migraines  . Brain cancer 10/09/05    glioblastoma right temp/occipital  . DDD (degenerative disc disease), lumbar   . Hypertension     benign  . Microcytic anemia     iron def  2ndary chronic blood loss  . Anemia     ALLERGIES:  is allergic to morphine; ondansetron; pseudoephedrine;  red dye; silver; and tape.  MEDICATIONS:  Current Outpatient Prescriptions  Medication Sig Dispense Refill  . amitriptyline (ELAVIL) 10 MG tablet Take 3 tablets (30 mg total) by mouth at bedtime.  90 tablet  3  . dexamethasone (DECADRON) 1 MG tablet 0.5 mg. Take 0.5 tablets (0.5 mg total) by mouth daily with breakfast.      . estradiol (ESTRACE) 2 MG tablet Take 2 mg by mouth daily.      Marland Kitchen oxyCODONE-acetaminophen (PERCOCET) 5-325 MG per tablet Take 2 tablets by mouth every 4 (four) hours as needed for pain.  20 tablet  0  . pantoprazole (PROTONIX) 40 MG tablet Take by mouth. Take 40 mg by mouth daily.      Marland Kitchen HYDROcodone-acetaminophen (VICODIN) 5-500 MG per tablet Take by mouth as needed.      Marland Kitchen LORazepam (ATIVAN) 1 MG tablet Take 1 mg by mouth every 6 (six) hours as needed. For anxiety      . Magnesium Oxide -Mg Supplement 250 MG TABS Take 250 mg by mouth at bedtime.      . metoprolol tartrate (LOPRESSOR) 25 MG tablet Take 1 tablet (25 mg total) by mouth 2 (two) times daily.  180 tablet  0  . promethazine (PHENERGAN) 25 MG tablet 25 mg. Take 1 tablet (25 mg total) by mouth every 4 (four) hours as needed.      . Riboflavin 400 MG CAPS Take 400 mg by mouth daily.       No current facility-administered medications for this visit.    SURGICAL HISTORY:  Past Surgical History  Procedure Laterality Date  . Abdominal hysterectomy    . Bilateral salpingoophorectomy    . Bladder surgery      tack  . Back surgery      lumbar  . Resection of glioblastoma multiform    . Portacath placement    . Cystocele repair    . Cesarean section      x 2     REVIEW OF SYSTEMS:  Pertinent items are noted in HPI.   HEALTH MAINTENANCE: PHYSICAL EXAMINATION: Blood pressure 137/90, pulse 72, temperature 98 F (36.7 C), temperature source Oral, resp. rate 20, height 5\' 5"  (1.651 m), weight 292 lb 14.4 oz (132.859 kg). Body mass index is 48.74 kg/(m^2). ECOG PERFORMANCE STATUS: 0 -  Asymptomatic  Well-developed nourished female completed by her husband HEENT exam EOMI PERRLA sclerae anicteric no conjunctival pallor oral mucosa is moist neck is supple lungs clear cardiovascular regular rate rhythm abdomen soft nontender no HSM extremities no edema neuro is nonfocal  LABORATORY DATA: Lab Results  Component Value Date   WBC 8.9 01/12/2013   HGB 13.6 01/12/2013   HCT 40.2 01/12/2013   MCV 86.2 01/12/2013   PLT 244 01/12/2013      Chemistry      Component Value Date/Time   NA 141 01/12/2013 0854   NA 141 01/12/2009 1445   K 4.5 01/12/2013 0854   K 4.4 01/12/2009 1445   CL 105 01/12/2013 0854   CL 107 01/12/2009 1445   CO2 27 01/12/2013 0854   CO2 27 01/12/2009 1445   BUN 9.5 01/12/2013 0854   BUN 9 01/12/2009 1445   CREATININE 0.8 01/12/2013 0854   CREATININE 0.68 01/12/2009 1445      Component Value Date/Time   CALCIUM 9.6 01/12/2013 0854   CALCIUM 9.6 01/12/2009 1445   ALKPHOS 87 01/12/2013 0854   ALKPHOS 98 01/12/2009 1445   AST 19 01/12/2013 0854   AST 23 01/12/2009 1445   ALT 17 01/12/2013 0854   ALT 16 01/12/2009 1445   BILITOT 0.48 01/12/2013 0854   BILITOT 0.7 01/12/2009 1445       RADIOGRAPHIC STUDIES:  No results found.  ASSESSMENT: 52 year old female with  #1 long sanding history of GBM. She is now status post SRS at [facility]. Course complicated by development of headaches. She is being seen by Dr. Dr. Jannifer Franklin from neurology. She will also continue to be seen at [facility]: I will continue to see her once a year   All questions were answered. The patient knows to call the clinic with any problems, questions or concerns. We can certainly see the patient much sooner if necessary.  I spent 25 minutes counseling the patient face to face. The total time spent in the appointment was 30 minutes.    Marcy Panning, MD Medical/Oncology [facility] [phone] (beeper) [phone] (Office)

## 2013-02-17 ENCOUNTER — Telehealth: Payer: Self-pay

## 2013-02-17 MED ORDER — AMITRIPTYLINE HCL 25 MG PO TABS: 50.0000 mg | ORAL_TABLET | Freq: Every day | ORAL | Status: DC

## 2013-02-17 NOTE — Telephone Encounter (Signed)
Patient called clinic, left message stating she has titrated up to 50mg  of Amitriptyline daily and would like a new Rx for this dose.  Please advise.  Thank you.

## 2013-02-17 NOTE — Telephone Encounter (Signed)
I'll call in a prescription for the amitriptyline.

## 2013-03-04 ENCOUNTER — Telehealth: Payer: Self-pay

## 2013-03-04 NOTE — Telephone Encounter (Signed)
Patient called clinic, left message stating she needs a refill on Amitriptyline 50mg .  This Rx was sent to the pharmacy on 05/13.  I called the pharmacy and spoke with Lanora Manis.  She said they do have Rx, but it has not been picked up.  I called the patient back.  Got no answer.  Left message.

## 2013-04-29 ENCOUNTER — Other Ambulatory Visit: Payer: Self-pay

## 2013-04-29 DIAGNOSIS — Z1231 Encounter for screening mammogram for malignant neoplasm of breast: Secondary | ICD-10-CM

## 2013-05-06 ENCOUNTER — Other Ambulatory Visit: Payer: Self-pay | Admitting: Family Medicine

## 2013-05-06 NOTE — Telephone Encounter (Signed)
Rout to PCP 

## 2013-05-12 ENCOUNTER — Ambulatory Visit
Admission: RE | Admit: 2013-05-12 | Discharge: 2013-05-12 | Disposition: A | Discharge status: Home / Self Care | Payer: 59 | Source: Ambulatory Visit

## 2013-05-12 DIAGNOSIS — Z1231 Encounter for screening mammogram for malignant neoplasm of breast: Secondary | ICD-10-CM

## 2013-06-04 ENCOUNTER — Encounter: Payer: Self-pay | Admitting: Neurology

## 2013-06-04 ENCOUNTER — Ambulatory Visit (INDEPENDENT_AMBULATORY_CARE_PROVIDER_SITE_OTHER): Payer: 59 | Admitting: Neurology

## 2013-06-04 DIAGNOSIS — C719 Malignant neoplasm of brain, unspecified: Secondary | ICD-10-CM

## 2013-06-04 DIAGNOSIS — G43909 Migraine, unspecified, not intractable, without status migrainosus: Secondary | ICD-10-CM

## 2013-06-04 MED ORDER — DIVALPROEX SODIUM ER 500 MG PO TB24: ORAL_TABLET | ORAL | Status: DC

## 2013-06-04 NOTE — Patient Instructions (Signed)
With the amitriptyline, take one of the 25 mg tablets at night for two weeks, then stop the medication. In the mean time, start the Depakote while coming off of the amitriptyline.

## 2013-06-04 NOTE — Progress Notes (Signed)
Reason for visit: Headache  [name] [name] is an 52 y.o. female  History of present illness:  Ms. with low is a 52 year old right-handed white female with a history of a right occipital and temporal glioblastoma. The patient recently had MRI evaluation on 05/29/2013 that showed recurrence of the tumor around the resection cavity. The patient has had gamma knife treatments previously. The patient indicates that her headaches have improved on the amitriptyline, currently on 50 mg at night. The patient however, is having frequent sweats on the medication, and nightmares at night. The patient is not tolerating medication well. The patient returns to this office for an evaluation. The patient reports no new numbness or weakness on the arms or legs, and no gait instability. The patient does have some generalized fatigue issues. The patient is off of the steroids.  Past Medical History  Diagnosis Date  . Migraine, unspecified, without mention of intractable migraine without mention of status migrainosus   . Overweight(278.02)   . PVC (premature ventricular contraction)   . Tachycardia, unspecified   . Bilateral ovarian cysts   . Viral meningitis   . Glioblastoma multiforme 2007    Followed by Dr. Vedia Coffer at [facility]  . IBS (irritable bowel syndrome)   . Depression     Migraines  . Brain cancer 10/09/05    glioblastoma right temp/occipital  . DDD (degenerative disc disease), lumbar   . Hypertension     benign  . Microcytic anemia     iron def  2ndary chronic blood loss  . Anemia   . Obesity     Past Surgical History  Procedure Laterality Date  . Abdominal hysterectomy    . Bilateral salpingoophorectomy    . Bladder surgery      tack  . Back surgery      lumbar  . Resection of glioblastoma multiform    . Portacath placement    . Cystocele repair    . Cesarean section      x 2     Family History  Problem Relation Age of Onset  . Cancer Other     tongue  . Heart  disease Mother     bypass age 63  . Heart disease Father     heart failure, atrial fib  . Diabetes Father   . Liver disease Brother   . Diabetes Brother   . Colon cancer Neg Hx   . Diabetes Sister     Social history:  reports that she has never smoked. She has never used smokeless tobacco. She reports that  drinks alcohol. She reports that she does not use illicit drugs.    Allergies  Allergen Reactions  . Morphine     Other reaction(s): Bladder Spasms (intolerance) bladder spasms  . Ondansetron Other (See Comments)    Severe headache Severe headache  . Pseudoephedrine     Other reaction(s): Other (See Comments) SUDAFED=PVC'S  . Red Dye Rash  . Silver Rash    Tegaderm  . Tape Rash and Itching    Adhesive silicone    Medications:  Current Outpatient Prescriptions on File Prior to Visit  Medication Sig Dispense Refill  . estradiol (ESTRACE) 2 MG tablet Take 2 mg by mouth daily.      Marland Kitchen LORazepam (ATIVAN) 1 MG tablet Take 1 mg by mouth every 6 (six) hours as needed. For anxiety      . Magnesium Oxide -Mg Supplement 250 MG TABS Take 250 mg by mouth at bedtime.      Marland Kitchen  metoprolol tartrate (LOPRESSOR) 25 MG tablet TAKE 1 TABLET BY MOUTH TWICE DAILY  180 tablet  0  . oxyCODONE-acetaminophen (PERCOCET) 5-325 MG per tablet Take 2 tablets by mouth every 4 (four) hours as needed for pain.  20 tablet  0  . pantoprazole (PROTONIX) 40 MG tablet Take by mouth. Take 40 mg by mouth daily.      . promethazine (PHENERGAN) 25 MG tablet 25 mg. Take 1 tablet (25 mg total) by mouth every 4 (four) hours as needed.      . Riboflavin 400 MG CAPS Take 400 mg by mouth daily.       No current facility-administered medications on file prior to visit.    ROS:  Out of a complete 14 system review of symptoms, the patient complains only of the following symptoms, and all other reviewed systems are negative.  Fatigue Snoring Feeling hot, flushed Skin sensitivity Memory loss, headache  There were  no vitals taken for this visit.  Physical Exam  General: The patient is alert and cooperative at the time of the examination. The patient is markedly obese.  Skin: No significant peripheral edema is noted.   Neurologic Exam  Mental status: The mental status examination done today shows a total score of 30 out of 30.  Cranial nerves: Facial symmetry is present. Speech is normal, no aphasia or dysarthria is noted. Extraocular movements are full. Visual fields are full.  Motor: The patient has good strength in all 4 extremities.  Coordination: The patient has good finger-nose-finger and heel-to-shin bilaterally.  Gait and station: The patient has a normal gait. Tandem gait is normal. Romberg is negative. No drift is seen.  Reflexes: Deep tendon reflexes are symmetric, but are depressed.   Assessment/Plan:  1. Right occipital and temporal glioblastoma, recurrence  2. Chronic daily headache  3. Obesity  The patient is doing better with her headaches on amitriptyline, but she is having some tolerance issues with this. The patient in the past has been on Topamax, Zonegran, and Cymbalta without benefit. The patient will be placed on Depakote. The patient will followup in 4 months. The amitriptyline will be tapered off.  Jill Alexanders MD 06/04/2013 8:53 PM  [facility] [address] [place], [place] [address]  Phone [phone] Fax [phone]

## 2013-07-07 ENCOUNTER — Other Ambulatory Visit: Payer: Self-pay

## 2013-07-07 MED ORDER — DIVALPROEX SODIUM ER 500 MG PO TB24: ORAL_TABLET | ORAL | Status: DC

## 2013-07-07 NOTE — Telephone Encounter (Signed)
Pharmacy sent a fax saying the patient requests a 90 day Rx for Depakote.

## 2013-07-20 ENCOUNTER — Ambulatory Visit (HOSPITAL_BASED_OUTPATIENT_CLINIC_OR_DEPARTMENT_OTHER): Payer: 59 | Admitting: Oncology

## 2013-07-20 ENCOUNTER — Encounter (HOSPITAL_COMMUNITY): Payer: Self-pay | Admitting: Emergency Medicine

## 2013-07-20 ENCOUNTER — Emergency Department (HOSPITAL_COMMUNITY): Payer: 59

## 2013-07-20 ENCOUNTER — Encounter: Payer: Self-pay | Admitting: Oncology

## 2013-07-20 ENCOUNTER — Emergency Department (HOSPITAL_COMMUNITY)
Admission: EM | Admit: 2013-07-20 | Discharge: 2013-07-20 | Disposition: A | Discharge status: Home / Self Care | Payer: 59 | Attending: Emergency Medicine | Admitting: Emergency Medicine

## 2013-07-20 ENCOUNTER — Telehealth: Payer: Self-pay | Admitting: Oncology

## 2013-07-20 VITALS — BP 137/83 | HR 101 | Temp 98.0°F | Resp 20 | Ht 65.0 in | Wt 292.6 lb

## 2013-07-20 DIAGNOSIS — G43909 Migraine, unspecified, not intractable, without status migrainosus: Secondary | ICD-10-CM | POA: Insufficient documentation

## 2013-07-20 DIAGNOSIS — Z8742 Personal history of other diseases of the female genital tract: Secondary | ICD-10-CM | POA: Insufficient documentation

## 2013-07-20 DIAGNOSIS — Z862 Personal history of diseases of the blood and blood-forming organs and certain disorders involving the immune mechanism: Secondary | ICD-10-CM | POA: Insufficient documentation

## 2013-07-20 DIAGNOSIS — R51 Headache: Secondary | ICD-10-CM

## 2013-07-20 DIAGNOSIS — R110 Nausea: Secondary | ICD-10-CM | POA: Insufficient documentation

## 2013-07-20 DIAGNOSIS — C719 Malignant neoplasm of brain, unspecified: Secondary | ICD-10-CM

## 2013-07-20 DIAGNOSIS — E669 Obesity, unspecified: Secondary | ICD-10-CM | POA: Insufficient documentation

## 2013-07-20 DIAGNOSIS — Z79899 Other long term (current) drug therapy: Secondary | ICD-10-CM | POA: Insufficient documentation

## 2013-07-20 DIAGNOSIS — I10 Essential (primary) hypertension: Secondary | ICD-10-CM | POA: Insufficient documentation

## 2013-07-20 DIAGNOSIS — Z85841 Personal history of malignant neoplasm of brain: Secondary | ICD-10-CM | POA: Insufficient documentation

## 2013-07-20 DIAGNOSIS — F329 Major depressive disorder, single episode, unspecified: Secondary | ICD-10-CM | POA: Insufficient documentation

## 2013-07-20 DIAGNOSIS — Z8719 Personal history of other diseases of the digestive system: Secondary | ICD-10-CM | POA: Insufficient documentation

## 2013-07-20 DIAGNOSIS — F3289 Other specified depressive episodes: Secondary | ICD-10-CM | POA: Insufficient documentation

## 2013-07-20 DIAGNOSIS — Z8739 Personal history of other diseases of the musculoskeletal system and connective tissue: Secondary | ICD-10-CM | POA: Insufficient documentation

## 2013-07-20 DIAGNOSIS — H53149 Visual discomfort, unspecified: Secondary | ICD-10-CM | POA: Insufficient documentation

## 2013-07-20 DIAGNOSIS — Z8669 Personal history of other diseases of the nervous system and sense organs: Secondary | ICD-10-CM | POA: Insufficient documentation

## 2013-07-20 MED ORDER — HYDROMORPHONE HCL PF 1 MG/ML IJ SOLN
1.0000 mg | Freq: Once | INTRAMUSCULAR | Status: AC
  Administered 2013-07-20: 1 mg via INTRAVENOUS

## 2013-07-20 MED ORDER — SODIUM CHLORIDE 0.9 % IV BOLUS (SEPSIS)
500.0000 mL | Freq: Once | INTRAVENOUS | Status: AC
  Administered 2013-07-20: 500 mL via INTRAVENOUS

## 2013-07-20 MED ORDER — PROMETHAZINE HCL 25 MG/ML IJ SOLN
25.0000 mg | Freq: Once | INTRAMUSCULAR | Status: AC
  Administered 2013-07-20: 25 mg via INTRAVENOUS
  Filled 2013-07-20: qty 1

## 2013-07-20 MED ORDER — HYDROMORPHONE HCL PF 1 MG/ML IJ SOLN
1.0000 mg | Freq: Once | INTRAMUSCULAR | Status: AC
  Administered 2013-07-20: 1 mg via INTRAVENOUS
  Filled 2013-07-20: qty 1

## 2013-07-20 NOTE — ED Notes (Signed)
Patient medicated for pain, see MAR Family members at bedside Patient denies further needs at this time Side rails up, call bell in reach

## 2013-07-20 NOTE — ED Notes (Signed)
MD at bedside. 

## 2013-07-20 NOTE — ED Notes (Signed)
Pt history of brain cancer; c/o severe migraine since 5pm; vomiting; symptoms not relieved by phenergan/percocet/maxalt

## 2013-07-20 NOTE — ED Notes (Signed)
Patient back from CT. NAD.

## 2013-07-20 NOTE — ED Provider Notes (Signed)
CSN: [id number]     Arrival date & time 07/20/13  1924 History   First MD Initiated Contact with Patient 07/20/13 1947     Chief Complaint  Patient presents with  . Migraine   (Consider location/radiation/quality/duration/timing/severity/associated sxs/prior Treatment) Patient is a 52 y.o. female presenting with migraines. The history is provided by the patient and a relative.  Migraine Associated symptoms include headaches. Pertinent negatives include no chest pain, no abdominal pain and no shortness of breath.  pt with hx gbm, s/p prior resection and gamma knife tx, hx migraines, c/o 'migraine' headache onset in past day. Gradual at onset, constant, throbbing, dull, mod-severe, frontal. States c/w prior migraine headaches. No neck pain or stiffness. No change in vision. Phonophobia and mild photophobia, also c/w her prior migraines. Nausea. No vomiting. Denies syncope/fainting. No recent head injury or fall. No associated numbness, weakness, or loss of function. Denies sinus drainage or congestion. No fever or chills.     Past Medical History  Diagnosis Date  . Migraine, unspecified, without mention of intractable migraine without mention of status migrainosus   . Overweight(278.02)   . PVC (premature ventricular contraction)   . Tachycardia, unspecified   . Bilateral ovarian cysts   . Viral meningitis   . Glioblastoma multiforme 2007    Follow[facility] by [facility]edia [facility] at [facility]  . IBS (irritable bowel syndrome)   . Depression     Migraines  . Brain cancer 10/09/05    glioblastoma right temp/occipital  . DDD (degenerative disc disease), lumbar   . Hypertension     benign  . Microcytic anemia     iron def  2ndary chronic blood loss  . Anemia   . Obesity    Past Surgical History  Procedure Laterality Date  . Abdominal hysterectomy    . Bilateral salpingoophorectomy    . Bladder surgery      tack  . Back surgery      lumbar  . Resection of glioblastoma multiform    . Portacath  placement    . Cystocele repair    . Cesarean section      x 2    Family History  Problem Relation Age of Onset  . Cancer Other     tongue  . Heart disease Mother     bypass age 42  . Heart disease Father     heart failure, atrial fib  . Diabetes Father   . Liver disease Brother   . Diabetes Brother   . Colon cancer Neg Hx   . Diabetes Sister    History  Substance Use Topics  . Smoking status: Never Smoker   . Smokeless tobacco: Never Used  . Alcohol Use: Yes     Comment: Rare-Social    OB History   Grav Para Term Preterm Abortions TAB SAB Ect Mult Living                 Review of Systems  Constitutional: Negative for fever and chills.  Eyes: Negative for pain, redness and visual disturbance.  Respiratory: Negative for shortness of breath.   Cardiovascular: Negative for chest pain.  Gastrointestinal: Positive for nausea. Negative for abdominal pain.  Genitourinary: Negative for flank pain.  Musculoskeletal: Negative for back pain and neck pain.  Skin: Negative for rash.  Neurological: Positive for headaches. Negative for syncope, weakness and numbness.  Hematological: Does not bruise/bleed easily.  Psychiatric/Behavioral: Negative for confusion.    Allergies  Morphine; Ondansetron; Pseudoephedrine; Red dye; Silver; and  Tape  Home Medications   Current Outpatient Rx  Name  Route  Sig  Dispense  Refill  . Brimonidine Tartrate (MIRVASO) 0.33 % GEL   Apply externally   Apply topically.         . divalproex (DEPAKOTE ER) 500 MG 24 hr tablet      Take one tablet at night for one week, then take 2 tablets at night   180 tablet   0   . estradiol (ESTRACE) 2 MG tablet   Oral   Take 2 mg by mouth daily.         Marland Kitchen HYDROcodone-acetaminophen (NORCO/VICODIN) 5-325 MG per tablet   Oral   Take 5-325 tablets by mouth daily. Take 1 tablet by mouth every 6 (six) hours as needed for Pain.         Marland Kitchen HYDROcodone-acetaminophen (NORCO/VICODIN) 5-325 MG per tablet       1 tablet.         Marland Kitchen LORazepam (ATIVAN) 1 MG tablet   Oral   Take 1 mg by mouth every 6 (six) hours as needed. For anxiety         . Magnesium Oxide -Mg Supplement 250 MG TABS   Oral   Take 250 mg by mouth at bedtime.         . metoprolol tartrate (LOPRESSOR) 25 MG tablet      TAKE 1 TABLET BY MOUTH TWICE DAILY   180 tablet   0   . metroNIDAZOLE (METROCREAM) 0.75 % cream   Topical   Apply 1 application topically daily.         Marland Kitchen oxyCODONE-acetaminophen (PERCOCET) 5-325 MG per tablet   Oral   Take 2 tablets by mouth every 4 (four) hours as needed for pain.   20 tablet   0   . pantoprazole (PROTONIX) 40 MG tablet      Take by mouth. Take 40 mg by mouth daily.         . promethazine (PHENERGAN) 25 MG tablet      25 mg. Take 1 tablet (25 mg total) by mouth every 4 (four) hours as needed.         . Riboflavin 400 MG CAPS   Oral   Take 400 mg by mouth daily.          BP 145/77  Pulse 79  Temp(Src) 97.5 F (36.4 C) (Oral)  Resp 18  SpO2 100% Physical Exam  Nursing note and vitals reviewed. Constitutional: She is oriented to person, place, and time. She appears well-developed and well-nourished. No distress.  HENT:  Head: Atraumatic.  Nose: Nose normal.  Mouth/Throat: Oropharynx is clear and moist.  No sinus or temporal tenderness.  Eyes: Conjunctivae and EOM are normal. Pupils are equal, round, and reactive to light. No scleral icterus.  Neck: Neck supple. No tracheal deviation present. No thyromegaly present.  No stiffness or rigidity.   Cardiovascular: Normal rate, regular rhythm, normal heart sounds and intact distal pulses.  Exam reveals no gallop and no friction rub.   No murmur heard. Pulmonary/Chest: Effort normal and breath sounds normal. No respiratory distress.  Abdominal: Soft. Normal appearance and bowel sounds are normal. She exhibits no distension. There is no tenderness.  Genitourinary:  No cva tenderness.  Musculoskeletal:  Normal range of motion. She exhibits no edema and no tenderness.  Neurological: She is alert and oriented to person, place, and time. No cranial nerve deficit.  No pronator drift. Motor intact bilaterally, strength 5/5.  Skin: Skin is warm and dry. No rash noted. She is not diaphoretic.  Psychiatric: She has a normal mood and affect.    ED Course  Procedures (including critical care time)  Ct Head Wo Contrast  07/20/2013   CLINICAL DATA:  GBM. Status post SRS.  Headache.  EXAM: CT HEAD WITHOUT CONTRAST  TECHNIQUE: Contiguous axial images were obtained from the base of the skull through the vertex without contrast.  COMPARISON:  Prior MRI of 09/24/2012.   Recent MRI not available.  FINDINGS: The prior right temporoparietal resection cavity has increased in size with an ill-defined area of increased attenuation extending anteriorly toward the temporal lobe (arrows, image 15 series 2), concerning for recurrence. The cavity measures 38 x 47 mm on image 16. There is also moderate vasogenic edema extending into the parieto-occipital white matter superiorly.  Dependent hemorrhage in the surgical bed is a new finding from 2013. Has the patient had recent head trauma.  No left hemisphere abnormalities are visible. Craniotomy site unremarkable. No acute parenchymal hemorrhage or extra-axial fluid collection. Clear sinuses and mastoids.  IMPRESSION: Findings concerning for tumor recurrence, with increased size of resection cavity, areas of peripheral increased attenuation, and moderate surrounding vasogenic edema.  Dependent hemorrhage in the surgical bed is also a new finding from recent priors. Has the patient had recent head trauma?   Electronically Signed   By: Rolla Flatten M.D.   On: 07/20/2013 21:15    ' EKG Interpretation   None       MDM  Iv ns 500 cc. Pt/fam request dilaudid and phenergan for 'migraine'.  States imitrex makes headache worse, cannot take morphine.  Also notes allergy to zofran.   Pt indicates cannot tolerate steroid rx either as causes profound worsening of her migraines.  Pt states long hx migraines, even prior to gbm dx/tx, and current headache feels same as prior migraines. Discussed possibility imaging w pt/family, they indicate they do not feel need for imaging, as current headache seems same as prior migraines, and unlike gbm related headache/pain.  Dilaudid 1 mg iv. Phenergan iv.  Initially little improvement in headache w iv meds, therefore, will get ct r/o new hemorrhage or abrupt increase in edema.  Ct head this evening read as very similar to mri from [facility] 8/14.    Dilaudid 1 mg iv.  Recheck headache resolved.  Pt states comfortable and requests d/c to home.  Hr 88 rr 16   Pt already has follow up with her doctors at [facility] [place] repeat mri later this month, and is also followed by Dr Jannifer Franklin for migraines.  Pt appears stable for d/c.     Mirna Mires, MD 07/20/13 2202

## 2013-07-20 NOTE — Patient Instructions (Signed)
Doing well We will continue to see you every 6 months

## 2013-07-20 NOTE — ED Notes (Signed)
Patient with hx brain cancer with c/o severe headache located behind the left eye since 1700 Patient also with c/o visual disturbances associated with the headache which she describes as "tunnel vision, wavy lines." Patient took several home medications without relief MD made aware of patient's arrival and that she is in need of pain medication

## 2013-07-20 NOTE — ED Notes (Signed)
Pt hx of brain cancer, migraine, cant keep PO fluids down.

## 2013-07-20 NOTE — Progress Notes (Signed)
OFFICE PROGRESS NOTE  CC  [name], MD [place] [address]  DIAGNOSIS: 52 year old female with prior history of right temperol for a glioblastoma multiforme diagnosed 10/09/2005 now with focal local recurrence.  PRIOR THERAPY: #1underwent a gross total resection of a right temporal occipital glioblastoma multi-forme in 10/09/2005 with Dr. Carloyn Manner.She subsequently was seen at [facility] and was enrolled on and a NABTT 0306 trial receiving radiotherapy and Temodar from 11/05/2005 to December 14 2698 Dr. Maylon Peppers and Jamse Arn . She is also received Cilengitide x 80 cycles through 02/21/2012 thereafter the protocol was discontinued. She was seen by Dr. Maylon Peppers and had MRI on 09/25/2012 that showed a focal recurrence which was possibly amenable to re irradiation with stereotactic radiosurgery surgery. She was referred to Drs. Vallarie Mare and Armed forces training and education officer for [employer] consultation on 11/10/2012.  #2 patient is now status post SRS. She tolerated it well. Unfortunate she is having chronic headache she's been seen by Dr. Jannifer Franklin.   CURRENT THERAPY:observation  INTERVAL HISTORY: Olanda Boughner Schellhase 52 y.o. female returns for followup visit today. Clinically she seems to be doing well. She continues to work 40 hours a week. She was seen by Dr. Jannifer Franklin he could increase her Depakote dosing. She feels much better. She has not had any seizure activity. She is due to have the MRI of the brain performed at the end of this month for followup. On her prior MRI she was noted to have some residual edema and do to her gamma knife. Is negative.  MEDICAL HISTORY: Past Medical History  Diagnosis Date  . Migraine, unspecified, without mention of intractable migraine without mention of status migrainosus   . Overweight(278.02)   . PVC (premature ventricular contraction)   . Tachycardia, unspecified   . Bilateral ovarian cysts   . Viral meningitis   . Glioblastoma multiforme  2007    Followed by Dr. Vedia Coffer at [facility]  . IBS (irritable bowel syndrome)   . Depression     Migraines  . Brain cancer 10/09/05    glioblastoma right temp/occipital  . DDD (degenerative disc disease), lumbar   . Hypertension     benign  . Microcytic anemia     iron def  2ndary chronic blood loss  . Anemia   . Obesity     ALLERGIES:  is allergic to morphine; ondansetron; pseudoephedrine; red dye; silver; and tape.  MEDICATIONS:  Current Outpatient Prescriptions  Medication Sig Dispense Refill  . Brimonidine Tartrate (MIRVASO) 0.33 % GEL Apply topically.      . divalproex (DEPAKOTE ER) 500 MG 24 hr tablet Take one tablet at night for one week, then take 2 tablets at night  180 tablet  0  . estradiol (ESTRACE) 2 MG tablet Take 2 mg by mouth daily.      Marland Kitchen HYDROcodone-acetaminophen (NORCO/VICODIN) 5-325 MG per tablet Take 5-325 tablets by mouth daily. Take 1 tablet by mouth every 6 (six) hours as needed for Pain.      Marland Kitchen HYDROcodone-acetaminophen (NORCO/VICODIN) 5-325 MG per tablet 1 tablet.      Marland Kitchen LORazepam (ATIVAN) 1 MG tablet Take 1 mg by mouth every 6 (six) hours as needed. For anxiety      . Magnesium Oxide -Mg Supplement 250 MG TABS Take 250 mg by mouth at bedtime.      . metoprolol tartrate (LOPRESSOR) 25 MG tablet TAKE 1 TABLET BY MOUTH TWICE DAILY  180 tablet  0  . metroNIDAZOLE (METROCREAM) 0.75 %  cream Apply 1 application topically daily.      Marland Kitchen oxyCODONE-acetaminophen (PERCOCET) 5-325 MG per tablet Take 2 tablets by mouth every 4 (four) hours as needed for pain.  20 tablet  0  . pantoprazole (PROTONIX) 40 MG tablet Take by mouth. Take 40 mg by mouth daily.      . promethazine (PHENERGAN) 25 MG tablet 25 mg. Take 1 tablet (25 mg total) by mouth every 4 (four) hours as needed.      . Riboflavin 400 MG CAPS Take 400 mg by mouth daily.       No current facility-administered medications for this visit.    SURGICAL HISTORY:  Past Surgical History  Procedure Laterality Date   . Abdominal hysterectomy    . Bilateral salpingoophorectomy    . Bladder surgery      tack  . Back surgery      lumbar  . Resection of glioblastoma multiform    . Portacath placement    . Cystocele repair    . Cesarean section      x 2     REVIEW OF SYSTEMS:  Pertinent items are noted in HPI.   HEALTH MAINTENANCE: PHYSICAL EXAMINATION: Blood pressure 137/83, pulse 101, temperature 98 F (36.7 C), temperature source Oral, resp. rate 20, height 5\' 5"  (1.651 m), weight 292 lb 9.6 oz (132.722 kg). Body mass index is 48.69 kg/(m^2). ECOG PERFORMANCE STATUS: 0 - Asymptomatic  Well-developed nourished female completed by her husband HEENT exam EOMI PERRLA sclerae anicteric no conjunctival pallor oral mucosa is moist neck is supple lungs clear cardiovascular regular rate rhythm abdomen soft nontender no HSM extremities no edema neuro is nonfocal  LABORATORY DATA: Lab Results  Component Value Date   WBC 8.9 01/12/2013   HGB 13.6 01/12/2013   HCT 40.2 01/12/2013   MCV 86.2 01/12/2013   PLT 244 01/12/2013      Chemistry      Component Value Date/Time   NA 141 01/12/2013 0854   NA 141 01/12/2009 1445   K 4.5 01/12/2013 0854   K 4.4 01/12/2009 1445   CL 105 01/12/2013 0854   CL 107 01/12/2009 1445   CO2 27 01/12/2013 0854   CO2 27 01/12/2009 1445   BUN 9.5 01/12/2013 0854   BUN 9 01/12/2009 1445   CREATININE 0.8 01/12/2013 0854   CREATININE 0.68 01/12/2009 1445      Component Value Date/Time   CALCIUM 9.6 01/12/2013 0854   CALCIUM 9.6 01/12/2009 1445   ALKPHOS 87 01/12/2013 0854   ALKPHOS 98 01/12/2009 1445   AST 19 01/12/2013 0854   AST 23 01/12/2009 1445   ALT 17 01/12/2013 0854   ALT 16 01/12/2009 1445   BILITOT 0.48 01/12/2013 0854   BILITOT 0.7 01/12/2009 1445       RADIOGRAPHIC STUDIES:  No results found.  ASSESSMENT: 52 year old female with  #1 long sanding history of GBM. She is now status post SRS at [facility]. Course complicated by development of headaches. She is being seen by Dr. Dr. Jannifer Franklin from  neurology. She will also continue to be seen at [place]: I will continue to see her once a year   All questions were answered. The patient knows to call the clinic with any problems, questions or concerns. We can certainly see the patient much sooner if necessary.  I spent 15 minutes counseling the patient face to face. The total time spent in the appointment was 15 minutes.    Marcy Panning, MD  Medical/Oncology [facility] [phone] (beeper) [phone] (Office)

## 2013-07-20 NOTE — ED Notes (Signed)
Patient taken to CT  Patient in NAD upon leaving room

## 2013-07-24 ENCOUNTER — Encounter (INDEPENDENT_AMBULATORY_CARE_PROVIDER_SITE_OTHER): Payer: Self-pay

## 2013-07-24 ENCOUNTER — Ambulatory Visit (HOSPITAL_BASED_OUTPATIENT_CLINIC_OR_DEPARTMENT_OTHER): Payer: 59

## 2013-07-24 VITALS — BP 154/83 | HR 80 | Temp 98.2°F

## 2013-07-24 DIAGNOSIS — Z452 Encounter for adjustment and management of vascular access device: Secondary | ICD-10-CM

## 2013-07-24 DIAGNOSIS — C719 Malignant neoplasm of brain, unspecified: Secondary | ICD-10-CM

## 2013-07-24 MED ORDER — SODIUM CHLORIDE 0.9 % IJ SOLN
10.0000 mL | INTRAMUSCULAR | Status: DC | PRN
  Administered 2013-07-24: 10 mL via INTRAVENOUS
  Filled 2013-07-24: qty 10

## 2013-07-24 MED ORDER — HEPARIN SOD (PORK) LOCK FLUSH 100 UNIT/ML IV SOLN
500.0000 [IU] | Freq: Once | INTRAVENOUS | Status: AC
  Administered 2013-07-24: 500 [IU] via INTRAVENOUS
  Filled 2013-07-24: qty 5

## 2013-08-07 ENCOUNTER — Other Ambulatory Visit: Payer: Self-pay | Admitting: Family Medicine

## 2013-08-07 NOTE — Telephone Encounter (Signed)
Last office visit 01/07/2012.  Ok to refill?

## 2013-08-10 ENCOUNTER — Telehealth: Payer: Self-pay | Admitting: Family Medicine

## 2013-08-10 NOTE — Telephone Encounter (Signed)
Caller: Darlene/Child; Phone: [phone]; Reason for Call: Patient is calling to inform Dr.  Dayton Martes that her Brain Cancer ( Glioblastoma) is active again.  Patient states she has 2 tumors that have recurred in the same cavity and is being scheduled for a Craniotomy, per Dr.  Lilli Light, at [facility] in the next 2-3 weeks.  States procedure is in correlation with the research protocol.  Patient states she will placed on a research chemotherapy in conjunction with the surgery.   Patient states she is calling to give a status update to Dr.  Dayton Martes.

## 2013-08-10 NOTE — Telephone Encounter (Signed)
Thank you for the update, we will be thinking about her!

## 2013-09-22 ENCOUNTER — Telehealth: Payer: Self-pay | Admitting: Oncology

## 2013-10-23 ENCOUNTER — Other Ambulatory Visit: Payer: Self-pay | Admitting: Emergency Medicine

## 2013-10-23 DIAGNOSIS — C719 Malignant neoplasm of brain, unspecified: Secondary | ICD-10-CM

## 2013-10-27 ENCOUNTER — Other Ambulatory Visit (HOSPITAL_BASED_OUTPATIENT_CLINIC_OR_DEPARTMENT_OTHER): Payer: 59

## 2013-10-27 DIAGNOSIS — C719 Malignant neoplasm of brain, unspecified: Secondary | ICD-10-CM

## 2013-10-27 DIAGNOSIS — C712 Malignant neoplasm of temporal lobe: Secondary | ICD-10-CM

## 2013-10-27 LAB — CBC WITH DIFFERENTIAL/PLATELET
BASO%: 0.7 % (ref 0.0–2.0)
BASOS ABS: 0.1 10*3/uL (ref 0.0–0.1)
EOS ABS: 0.4 10*3/uL (ref 0.0–0.5)
EOS%: 4.2 % (ref 0.0–7.0)
HCT: 35.9 % (ref 34.8–46.6)
HEMOGLOBIN: 11.9 g/dL (ref 11.6–15.9)
LYMPH%: 20.2 % (ref 14.0–49.7)
MCH: 28.6 pg (ref 25.1–34.0)
MCHC: 33.1 g/dL (ref 31.5–36.0)
MCV: 86.2 fL (ref 79.5–101.0)
MONO#: 0.5 10*3/uL (ref 0.1–0.9)
MONO%: 5.9 % (ref 0.0–14.0)
NEUT%: 69 % (ref 38.4–76.8)
NEUTROS ABS: 5.8 10*3/uL (ref 1.5–6.5)
Platelets: 233 10*3/uL (ref 145–400)
RBC: 4.17 10*6/uL (ref 3.70–5.45)
RDW: 13.7 % (ref 11.2–14.5)
WBC: 8.4 10*3/uL (ref 3.9–10.3)
lymph#: 1.7 10*3/uL (ref 0.9–3.3)

## 2013-10-27 LAB — COMPREHENSIVE METABOLIC PANEL (CC13)
ALBUMIN: 3.2 g/dL — AB (ref 3.5–5.0)
ALK PHOS: 96 U/L (ref 40–150)
ALT: 37 U/L (ref 0–55)
ANION GAP: 12 meq/L — AB (ref 3–11)
AST: 54 U/L — ABNORMAL HIGH (ref 5–34)
BUN: 12.3 mg/dL (ref 7.0–26.0)
CALCIUM: 8.4 mg/dL (ref 8.4–10.4)
CO2: 28 meq/L (ref 22–29)
Chloride: 103 mEq/L (ref 98–109)
Creatinine: 1.6 mg/dL — ABNORMAL HIGH (ref 0.6–1.1)
GLUCOSE: 88 mg/dL (ref 70–140)
POTASSIUM: 3.2 meq/L — AB (ref 3.5–5.1)
Sodium: 142 mEq/L (ref 136–145)
TOTAL PROTEIN: 6.5 g/dL (ref 6.4–8.3)
Total Bilirubin: 0.59 mg/dL (ref 0.20–1.20)

## 2013-11-25 ENCOUNTER — Ambulatory Visit: Payer: 59

## 2013-11-25 DIAGNOSIS — Z95828 Presence of other vascular implants and grafts: Secondary | ICD-10-CM

## 2013-11-25 MED ORDER — SODIUM CHLORIDE 0.9 % IJ SOLN
10.0000 mL | INTRAMUSCULAR | Status: DC | PRN
  Filled 2013-11-25: qty 10

## 2013-11-25 MED ORDER — HEPARIN SOD (PORK) LOCK FLUSH 100 UNIT/ML IV SOLN
500.0000 [IU] | Freq: Once | INTRAVENOUS | Status: DC
  Filled 2013-11-25: qty 5

## 2013-11-25 NOTE — Progress Notes (Signed)
Patient in for Port-A-Cath flush today. Patient states, "I don't need my port flushed today because I just had it flushed at [facility] on Monday." Asked Patient if she would like to schedule future port flushes with the Scheduling Department. Patient states, "No because I will have another MRI at [facility], and my port will be flushed at that time." Encouraged Patient to call the [facility] with any questions or concerns or if her schedule changes. Patient verbalized understanding.

## 2013-11-27 ENCOUNTER — Other Ambulatory Visit: Payer: Self-pay | Admitting: Family Medicine

## 2013-11-27 ENCOUNTER — Other Ambulatory Visit: Payer: Self-pay | Admitting: Internal Medicine

## 2013-11-27 NOTE — Telephone Encounter (Signed)
f °

## 2013-11-27 NOTE — Telephone Encounter (Signed)
Last filled 08/07/13 90 day supply--- Pt has not seen you in a long time-- please advise if okay to fill

## 2013-12-08 ENCOUNTER — Telehealth: Payer: Self-pay | Admitting: Oncology

## 2013-12-08 NOTE — Telephone Encounter (Signed)
, °

## 2013-12-16 NOTE — Progress Notes (Signed)
Report received from [facility] Dr. Maylon Peppers.  Provided to Dr. Humphrey Rolls.

## 2013-12-24 ENCOUNTER — Encounter: Payer: Self-pay | Admitting: Neurology

## 2013-12-24 ENCOUNTER — Ambulatory Visit (INDEPENDENT_AMBULATORY_CARE_PROVIDER_SITE_OTHER): Payer: 59 | Admitting: Neurology

## 2013-12-24 ENCOUNTER — Encounter (INDEPENDENT_AMBULATORY_CARE_PROVIDER_SITE_OTHER): Payer: Self-pay

## 2013-12-24 VITALS — BP 137/94 | HR 94 | Wt 277.0 lb

## 2013-12-24 DIAGNOSIS — G43909 Migraine, unspecified, not intractable, without status migrainosus: Secondary | ICD-10-CM

## 2013-12-24 NOTE — Progress Notes (Signed)
Reason for visit: Migraine headache  Polly Barner is an 53 y.o. female  History of present illness:  Ms. Silveira is a 53 year old right-handed white female with a history of a right occipital and temporal glioblastoma multiforme. The patient has had a recent surgical resection of a tumor recurrence in December 2014 at [facility]. The patient has done quite well following the surgery. The patient has never had seizures, and she was on Depakote mainly for migraine and for seizure prophylaxis. The patient was taken off of Depakote, and she feels as if her headaches have actually improved with cessation of this medication and with the surgery. The patient is having occasional migraine headaches, and Maxalt will help if she gets a bad headache. The patient is having 2 or 3 headaches a month. The patient rarely notes any incapacitating headaches. The patient is on an experimental drug for chemotherapy, and she has noted some problems with numbness in the feet with this chemotherapy. With higher doses of the chemotherapy medication, the patient went into acute renal failure, and the doses had to be lowered. The patient is back to work, and she is operating a Teacher, music. The patient returns to this office for an evaluation.  Past Medical History  Diagnosis Date  . Migraine, unspecified, without mention of intractable migraine without mention of status migrainosus   . Overweight   . PVC (premature ventricular contraction)   . Tachycardia, unspecified   . Bilateral ovarian cysts   . Viral meningitis   . Glioblastoma multiforme 2007    Followed by Dr. Vedia Coffer at [facility]  . IBS (irritable bowel syndrome)   . Depression     Migraines  . Brain cancer 10/09/05    glioblastoma right temp/occipital  . DDD (degenerative disc disease), lumbar   . Hypertension     benign  . Microcytic anemia     iron def  2ndary chronic blood loss  . Anemia   . Obesity     Past Surgical History  Procedure  Laterality Date  . Abdominal hysterectomy    . Bilateral salpingoophorectomy    . Bladder surgery      tack  . Back surgery      lumbar  . Resection of glioblastoma multiform    . Portacath placement    . Cystocele repair    . Cesarean section      x 2     Family History  Problem Relation Age of Onset  . Cancer Other     tongue  . Heart disease Mother     bypass age 21  . Heart disease Father     heart failure, atrial fib  . Diabetes Father   . Liver disease Brother   . Diabetes Brother   . Colon cancer Neg Hx   . Diabetes Sister     Social history:  reports that she has never smoked. She has never used smokeless tobacco. She reports that she drinks alcohol. She reports that she does not use illicit drugs.    Allergies  Allergen Reactions  . Morphine     Other reaction(s): Bladder Spasms (intolerance) bladder spasms  . Ondansetron Other (See Comments)    Severe headache   . Pseudoephedrine     Other reaction(s): Other (See Comments) SUDAFED=PVC'S  . Latex Rash and Swelling    Per patient discolor of skin on face.  . Red Dye Rash  . Silver Rash    Tegaderm  . Tape Rash  and Itching    Adhesive silicone    Medications:  Current Outpatient Prescriptions on File Prior to Visit  Medication Sig Dispense Refill  . Brimonidine Tartrate (MIRVASO) 0.33 % GEL Apply topically.      . cholecalciferol (VITAMIN D) 1000 UNITS tablet Take 1,000 Units by mouth daily.      Marland Kitchen estradiol (ESTRACE) 2 MG tablet Take 2 mg by mouth daily.      Marland Kitchen HYDROcodone-acetaminophen (NORCO/VICODIN) 5-325 MG per tablet Take 1 tablet by mouth every 6 (six) hours as needed.       Marland Kitchen LORazepam (ATIVAN) 1 MG tablet Take 1 mg by mouth every 6 (six) hours as needed. For anxiety      . Magnesium Oxide -Mg Supplement 250 MG TABS Take 250 mg by mouth daily.       . metoprolol tartrate (LOPRESSOR) 25 MG tablet TAKE 1 TABLET BY MOUTH TWICE DAILY  180 tablet  0  . metroNIDAZOLE (METROCREAM) 0.75 % cream  Apply 1 application topically daily.      Marland Kitchen oxyCODONE-acetaminophen (PERCOCET) 5-325 MG per tablet Take 2 tablets by mouth every 4 (four) hours as needed for pain.  20 tablet  0  . pantoprazole (PROTONIX) 40 MG tablet Take by mouth. Take 40 mg by mouth daily.      . promethazine (PHENERGAN) 25 MG tablet 25 mg. Take 1 tablet (25 mg total) by mouth every 4 (four) hours as needed.      . Riboflavin 400 MG CAPS Take 400 mg by mouth daily.      . rizatriptan (MAXALT) 10 MG tablet Take 10 mg by mouth 2 (two) times daily as needed for migraine. May repeat in 2 hours if needed       No current facility-administered medications on file prior to visit.    ROS:  Out of a complete 14 system review of symptoms, the patient complains only of the following symptoms, and all other reviewed systems are negative.  Fatigue Flushing Environmental allergies, food allergies Blood in the urine Joint pain, back pain, achy muscles Headache  Blood pressure 137/94, pulse 94, weight 277 lb (125.646 kg).  Physical Exam  General: The patient is alert and cooperative at the time of the examination. The patient is markedly obese.  Skin: No significant peripheral edema is noted.   Neurologic Exam  Mental status: The patient is oriented x 3.  Cranial nerves: Facial symmetry is present. Speech is normal, no aphasia or dysarthria is noted. Extraocular movements are full. Visual fields are full.  Motor: The patient has good strength in all 4 extremities.  Sensory examination: Soft touch sensation is symmetric on the face, arms, and legs.  Coordination: The patient has good finger-nose-finger and heel-to-shin bilaterally.  Gait and station: The patient has a normal gait. Tandem gait is minimally unsteady. Romberg is negative. No drift is seen.  Reflexes: Deep tendon reflexes are symmetric, but are depressed.   Assessment/Plan:  1. Right occipital and temporal glioblastoma multiforme  2. Migraine  headache  The patient is doing quite well at this time with her headaches. The patient has Maxalt to take if needed. No specific therapy was recommended at this time, the patient will followup through this office in 6-8 months.  Jill Alexanders MD 12/24/2013 5:05 PM  [facility] [address] [place], [place] [address]  Phone [phone] Fax [phone]

## 2013-12-24 NOTE — Patient Instructions (Signed)
Migraine Headache A migraine headache is an intense, throbbing pain on one or both sides of your head. A migraine can last for 30 minutes to several hours. CAUSES  The exact cause of a migraine headache is not always known. However, a migraine may be caused when nerves in the brain become irritated and release chemicals that cause inflammation. This causes pain. Certain things may also trigger migraines, such as:  Alcohol.  Smoking.  Stress.  Menstruation.  Aged cheeses.  Foods or drinks that contain nitrates, glutamate, aspartame, or tyramine.  Lack of sleep.  Chocolate.  Caffeine.  Hunger.  Physical exertion.  Fatigue.  Medicines used to treat chest pain (nitroglycerine), birth control pills, estrogen, and some blood pressure medicines. SIGNS AND SYMPTOMS  Pain on one or both sides of your head.  Pulsating or throbbing pain.  Severe pain that prevents daily activities.  Pain that is aggravated by any physical activity.  Nausea, vomiting, or both.  Dizziness.  Pain with exposure to bright lights, loud noises, or activity.  General sensitivity to bright lights, loud noises, or smells. Before you get a migraine, you may get warning signs that a migraine is coming (aura). An aura may include:  Seeing flashing lights.  Seeing bright spots, halos, or zig-zag lines.  Having tunnel vision or blurred vision.  Having feelings of numbness or tingling.  Having trouble talking.  Having muscle weakness. DIAGNOSIS  A migraine headache is often diagnosed based on:  Symptoms.  Physical exam.  A CT scan or MRI of your head. These imaging tests cannot diagnose migraines, but they can help rule out other causes of headaches. TREATMENT Medicines may be given for pain and nausea. Medicines can also be given to help prevent recurrent migraines.  HOME CARE INSTRUCTIONS  Only take over-the-counter or prescription medicines for pain or discomfort as directed by your  health care provider. The use of long-term narcotics is not recommended.  Lie down in a dark, quiet room when you have a migraine.  Keep a journal to find out what may trigger your migraine headaches. For example, write down:  What you eat and drink.  How much sleep you get.  Any change to your diet or medicines.  Limit alcohol consumption.  Quit smoking if you smoke.  Get 7 9 hours of sleep, or as recommended by your health care provider.  Limit stress.  Keep lights dim if bright lights bother you and make your migraines worse. SEEK IMMEDIATE MEDICAL CARE IF:   Your migraine becomes severe.  You have a fever.  You have a stiff neck.  You have vision loss.  You have muscular weakness or loss of muscle control.  You start losing your balance or have trouble walking.  You feel faint or pass out.  You have severe symptoms that are different from your first symptoms. MAKE SURE YOU:   Understand these instructions.  Will watch your condition.  Will get help right away if you are not doing well or get worse. Document Released: 09/24/2005 Document Revised: 07/15/2013 Document Reviewed: 06/01/2013 Exit[employer] Patient Information 2014 [employer].  

## 2014-01-04 ENCOUNTER — Other Ambulatory Visit: Payer: Self-pay

## 2014-01-04 ENCOUNTER — Telehealth: Payer: Self-pay | Admitting: Oncology

## 2014-01-04 DIAGNOSIS — C719 Malignant neoplasm of brain, unspecified: Secondary | ICD-10-CM

## 2014-01-04 NOTE — Progress Notes (Signed)
Pt requesting lab check - cramping and sharp pain over wkend.  OK per [place] - lab order placed, POF sent.

## 2014-01-05 ENCOUNTER — Other Ambulatory Visit (HOSPITAL_BASED_OUTPATIENT_CLINIC_OR_DEPARTMENT_OTHER): Payer: 59

## 2014-01-05 ENCOUNTER — Other Ambulatory Visit: Payer: Self-pay

## 2014-01-05 DIAGNOSIS — C719 Malignant neoplasm of brain, unspecified: Secondary | ICD-10-CM

## 2014-01-05 DIAGNOSIS — C712 Malignant neoplasm of temporal lobe: Secondary | ICD-10-CM

## 2014-01-05 LAB — COMPREHENSIVE METABOLIC PANEL (CC13)
ALT: 25 U/L (ref 0–55)
AST: 32 U/L (ref 5–34)
Albumin: 3.1 g/dL — ABNORMAL LOW (ref 3.5–5.0)
Alkaline Phosphatase: 108 U/L (ref 40–150)
Anion Gap: 11 mEq/L (ref 3–11)
BILIRUBIN TOTAL: 0.35 mg/dL (ref 0.20–1.20)
BUN: 9.6 mg/dL (ref 7.0–26.0)
CO2: 24 mEq/L (ref 22–29)
Calcium: 10 mg/dL (ref 8.4–10.4)
Chloride: 108 mEq/L (ref 98–109)
Creatinine: 0.9 mg/dL (ref 0.6–1.1)
GLUCOSE: 144 mg/dL — AB (ref 70–140)
Potassium: 4.5 mEq/L (ref 3.5–5.1)
SODIUM: 143 meq/L (ref 136–145)
TOTAL PROTEIN: 6.9 g/dL (ref 6.4–8.3)

## 2014-01-05 LAB — MAGNESIUM (CC13): MAGNESIUM: 2 mg/dL (ref 1.5–2.5)

## 2014-01-05 NOTE — Progress Notes (Signed)
Per lab and pt request, order for MG added.

## 2014-01-07 ENCOUNTER — Telehealth: Payer: Self-pay

## 2014-01-07 NOTE — Telephone Encounter (Signed)
Message copied by Prentiss Bells on Thu Jan 07, 2014 11:31 AM ------      Message from: Deatra Robinson      Created: Wed Jan 06, 2014  4:41 PM       Labs look good ------

## 2014-01-07 NOTE — Telephone Encounter (Signed)
Spoke with husband to let pt know labs were good.  He voiced understanding.

## 2014-01-12 NOTE — Progress Notes (Signed)
Fax rcvd from [facility] dtd 01/09/14 - Dr. Maylon Peppers follow up visit.  Provided to [facility]

## 2014-01-29 ENCOUNTER — Telehealth: Payer: Self-pay | Admitting: Oncology

## 2014-01-29 NOTE — Telephone Encounter (Signed)
kk out - moved 4/30 appt to 5/13 w/PK. lmonvm for pt and mailed schedule.

## 2014-02-02 ENCOUNTER — Telehealth: Payer: Self-pay | Admitting: Oncology

## 2014-02-02 NOTE — Telephone Encounter (Signed)
, °

## 2014-02-04 ENCOUNTER — Other Ambulatory Visit: Payer: 59

## 2014-02-04 ENCOUNTER — Ambulatory Visit: Payer: 59 | Admitting: Oncology

## 2014-02-17 ENCOUNTER — Other Ambulatory Visit: Payer: 59

## 2014-02-17 ENCOUNTER — Ambulatory Visit: Payer: 59

## 2014-03-03 ENCOUNTER — Other Ambulatory Visit: Payer: Self-pay | Admitting: Family Medicine

## 2014-04-20 ENCOUNTER — Other Ambulatory Visit: Payer: Self-pay | Admitting: Family Medicine

## 2014-04-22 ENCOUNTER — Other Ambulatory Visit: Payer: Self-pay

## 2014-04-22 DIAGNOSIS — Z1231 Encounter for screening mammogram for malignant neoplasm of breast: Secondary | ICD-10-CM

## 2014-05-13 ENCOUNTER — Ambulatory Visit
Admission: RE | Admit: 2014-05-13 | Discharge: 2014-05-13 | Disposition: A | Discharge status: Home / Self Care | Payer: 59 | Source: Ambulatory Visit

## 2014-05-13 DIAGNOSIS — Z1231 Encounter for screening mammogram for malignant neoplasm of breast: Secondary | ICD-10-CM

## 2014-05-24 ENCOUNTER — Other Ambulatory Visit: Payer: Self-pay | Admitting: Family Medicine

## 2014-05-28 ENCOUNTER — Ambulatory Visit (INDEPENDENT_AMBULATORY_CARE_PROVIDER_SITE_OTHER): Payer: 59 | Admitting: Family Medicine

## 2014-05-28 ENCOUNTER — Encounter: Payer: Self-pay | Admitting: Family Medicine

## 2014-05-28 VITALS — BP 132/78 | HR 113 | Temp 98.3°F | Ht 63.0 in | Wt 273.8 lb

## 2014-05-28 DIAGNOSIS — I10 Essential (primary) hypertension: Secondary | ICD-10-CM

## 2014-05-28 DIAGNOSIS — G622 Polyneuropathy due to other toxic agents: Secondary | ICD-10-CM

## 2014-05-28 DIAGNOSIS — G620 Drug-induced polyneuropathy: Secondary | ICD-10-CM

## 2014-05-28 DIAGNOSIS — C719 Malignant neoplasm of brain, unspecified: Secondary | ICD-10-CM

## 2014-05-28 DIAGNOSIS — T451X5A Adverse effect of antineoplastic and immunosuppressive drugs, initial encounter: Secondary | ICD-10-CM

## 2014-05-28 MED ORDER — METOPROLOL TARTRATE 25 MG PO TABS: ORAL_TABLET | ORAL | Status: AC

## 2014-05-28 MED ORDER — PANTOPRAZOLE SODIUM 40 MG PO TBEC: 40.0000 mg | DELAYED_RELEASE_TABLET | Freq: Every day | ORAL | Status: DC

## 2014-05-28 NOTE — Assessment & Plan Note (Signed)
New- does not want rx yet for this.

## 2014-05-28 NOTE — Progress Notes (Signed)
Pre visit review using our clinic review tool, if applicable. No additional management support is needed unless otherwise documented below in the visit note. 

## 2014-05-28 NOTE — Progress Notes (Signed)
Subjective:    Patient ID: [name] [name], female    DOB: 30-Nov-1960, 53 y.o.   MRN: [id number]  HPI  53 yo pleasant female with h/o GBM, HTN, GERD here for follow up. I have not seen her for routine care since 2014.    GBM- right occipital and temporal.  Followed by Dr. Maylon Peppers at [facility]  Unfortunately had a recurrence in 09/2013- s/p repeat surgical resection. MRI yesterday- no change which is great news.  Not currently on any chemotherapy.  Last rx was 11/06/2013 and caused her magnesium and calcium to drop- had to have IV infusions.  Then oral chemo caused dehdyration and ARF- had to be hospitalized.  Has never had seizures.  Was on Depakote at one point but this was stopped.  Dr. Jannifer Franklin saw her for recurrent headaches- was last seen on 12/24/2013.  Note reviewed.  Advised continued Maxalt prn migraines.  Does have chemotherapy induced residual peripheral neuropathy.  Not taking anything medication for it.  Still working.   HTN- followed by Dr. Percival Spanish. On Lopressor 25 mg twice daily No further episodes of Tachycardia. Denies any CP, SOB, LE edema or blurred vision.   Patient Active Problem List   Diagnosis Date Noted  . Hypertension 01/07/2012  . Glioblastoma multiforme 01/07/2012  . OVERWEIGHT 06/20/2010  . PREMATURE VENTRICULAR CONTRACTIONS 06/20/2010  . UNSPECIFIED TACHYCARDIA 06/20/2010  . MIGRAINE HEADACHE 06/16/2010  . OVARIAN CYST 06/16/2010  . Brain cancer 10/09/2005   Past Medical History  Diagnosis Date  . Migraine, unspecified, without mention of intractable migraine without mention of status migrainosus   . Overweight(278.02)   . PVC (premature ventricular contraction)   . Tachycardia, unspecified   . Bilateral ovarian cysts   . Viral meningitis   . Glioblastoma multiforme 2007    Followed by Dr. Vedia Coffer at [facility]  . IBS (irritable bowel syndrome)   . Depression     Migraines  . Brain cancer 10/09/05    glioblastoma right temp/occipital  .  DDD (degenerative disc disease), lumbar   . Hypertension     benign  . Microcytic anemia     iron def  2ndary chronic blood loss  . Anemia   . Obesity    Past Surgical History  Procedure Laterality Date  . Abdominal hysterectomy    . Bilateral salpingoophorectomy    . Bladder surgery      tack  . Back surgery      lumbar  . Resection of glioblastoma multiform    . Portacath placement    . Cystocele repair    . Cesarean section      x 2    History  Substance Use Topics  . Smoking status: Never Smoker   . Smokeless tobacco: Never Used  . Alcohol Use: Yes     Comment: Rare-Social    Family History  Problem Relation Age of Onset  . Cancer Other     tongue  . Heart disease Mother     bypass age 75  . Heart disease Father     heart failure, atrial fib  . Diabetes Father   . Liver disease Brother   . Diabetes Brother   . Colon cancer Neg Hx   . Diabetes Sister    Allergies  Allergen Reactions  . Morphine     Other reaction(s): Bladder Spasms (intolerance) bladder spasms  . Ondansetron Other (See Comments)    Severe headache   . Pseudoephedrine     Other reaction(s): Other (  See Comments) SUDAFED=PVC'S  . Latex Rash and Swelling    Per patient discolor of skin on face.  . Red Dye Rash  . Silver Rash    Tegaderm  . Tape Rash and Itching    Adhesive silicone   Current Outpatient Prescriptions on File Prior to Visit  Medication Sig Dispense Refill  . Brimonidine Tartrate (MIRVASO) 0.33 % GEL Apply topically.      . Calcium Carb-Ergocalciferol 250-125 MG-UNIT TABS Take 1 tablet by mouth daily.      . cholecalciferol (VITAMIN D) 1000 UNITS tablet Take 1,000 Units by mouth daily.      Marland Kitchen estradiol (ESTRACE) 2 MG tablet Take 2 mg by mouth daily.      Marland Kitchen HYDROcodone-acetaminophen (NORCO/VICODIN) 5-325 MG per tablet Take 1 tablet by mouth every 6 (six) hours as needed.       Marland Kitchen LORazepam (ATIVAN) 1 MG tablet Take 1 mg by mouth every 6 (six) hours as needed. For anxiety       . magnesium oxide (MAG-OX) 400 MG tablet Take 400 mg by mouth daily.      . Magnesium Oxide -Mg Supplement 250 MG TABS Take 250 mg by mouth daily.       . metoprolol tartrate (LOPRESSOR) 25 MG tablet TAKE 1 TABLET BY MOUTH TWICE DAILY  60 tablet  0  . metroNIDAZOLE (METROCREAM) 0.75 % cream Apply 1 application topically daily.      . ondansetron (ZOFRAN) 8 MG tablet Take 8 mg by mouth as needed.      Marland Kitchen oxyCODONE-acetaminophen (PERCOCET) 5-325 MG per tablet Take 2 tablets by mouth every 4 (four) hours as needed for pain.  20 tablet  0  . pantoprazole (PROTONIX) 40 MG tablet Take by mouth. Take 40 mg by mouth daily.      . promethazine (PHENERGAN) 25 MG tablet 25 mg. Take 1 tablet (25 mg total) by mouth every 4 (four) hours as needed.      . Riboflavin 400 MG CAPS Take 400 mg by mouth daily.      . rizatriptan (MAXALT) 10 MG tablet Take 10 mg by mouth 2 (two) times daily as needed for migraine. May repeat in 2 hours if needed       No current facility-administered medications on file prior to visit.   The PMH, PSH, Social History, Family History, Medications, and allergies have been reviewed in [facility], and have been updated if relevant.   Review of Systems See HPI    No HA currently One migraine in past month. No blurred vision Appetite ok now- Wt Readings from Last 3 Encounters:  05/28/14 273 lb 12 oz (124.172 kg)  12/24/13 277 lb (125.646 kg)  07/20/13 292 lb 9.6 oz (132.722 kg)   Mood good- "feels blessed." Objective:   Physical Exam BP 132/78  Pulse 113  Temp(Src) 98.3 F (36.8 C) (Oral)  Ht 5\' 3"  (1.6 m)  Wt 273 lb 12 oz (124.172 kg)  BMI 48.50 kg/m2  SpO2 97%  General:  Well-developed,well-nourished,in no acute distress; alert,appropriate and cooperative throughout examination Head:  normocephalic and atraumatic.   Eyes:  vision grossly intact, pupils equal, pupils round, and pupils reactive to light.   Ears:  R ear normal and L ear normal.   Nose:  no external  deformity.   Mouth:  good dentition.   Lungs:  Normal respiratory effort, chest expands symmetrically. Lungs are clear to auscultation, no crackles or wheezes. Heart:  Normal rate and regular rhythm. S1  and S2 normal without gallop, murmur, click, rub or other extra sounds. Msk:  No deformity or scoliosis noted of thoracic or lumbar spine.   Extremities:  No clubbing, cyanosis, edema, or deformity noted with normal full range of motion of all joints.   Neurologic:  alert & oriented X3 and gait normal.   Skin:  Intact without suspicious lesions or rashes Psych:  Cognition and judgment appear intact. Alert and cooperative with normal attention span and concentration. No apparent delusions, illusions, hallucinations     Assessment & Plan:

## 2014-05-28 NOTE — Assessment & Plan Note (Signed)
Well controlled. Just had labs done at [facility]. No changes made- eRx sent.

## 2014-05-28 NOTE — Assessment & Plan Note (Signed)
MRI from yesterday showed no growth per pt. Not currently on any rx.

## 2014-05-31 ENCOUNTER — Other Ambulatory Visit: Payer: Self-pay | Admitting: Obstetrics and Gynecology

## 2014-05-31 ENCOUNTER — Telehealth: Payer: Self-pay | Admitting: Family Medicine

## 2014-05-31 NOTE — Telephone Encounter (Signed)
Relevant patient education assigned to patient using Emmi. ° °

## 2014-06-01 ENCOUNTER — Encounter: Payer: Self-pay | Admitting: *Deleted

## 2014-06-01 LAB — CYTOLOGY - PAP

## 2014-06-28 ENCOUNTER — Ambulatory Visit: Payer: 59 | Admitting: Neurology

## 2014-09-24 ENCOUNTER — Emergency Department (INDEPENDENT_AMBULATORY_CARE_PROVIDER_SITE_OTHER)
Admission: EM | Admit: 2014-09-24 | Discharge: 2014-09-24 | Disposition: A | Discharge status: Home / Self Care | Payer: 59 | Source: Home / Self Care | Attending: Family Medicine | Admitting: Family Medicine

## 2014-09-24 ENCOUNTER — Encounter (HOSPITAL_COMMUNITY): Payer: Self-pay

## 2014-09-24 DIAGNOSIS — J0101 Acute recurrent maxillary sinusitis: Secondary | ICD-10-CM

## 2014-09-24 MED ORDER — IPRATROPIUM BROMIDE 0.06 % NA SOLN: 2.0000 | Freq: Four times a day (QID) | NASAL | Status: DC

## 2014-09-24 MED ORDER — AMOXICILLIN-POT CLAVULANATE 875-125 MG PO TABS: 1.0000 | ORAL_TABLET | Freq: Two times a day (BID) | ORAL | Status: DC

## 2014-09-24 NOTE — ED Provider Notes (Signed)
CSN: [id number]     Arrival date & time 09/24/14  9563 History   First MD Initiated Contact with Patient 09/24/14 1010     Chief Complaint  Patient presents with  . URI   (Consider location/radiation/quality/duration/timing/severity/associated sxs/prior Treatment) Patient is a 53 y.o. female presenting with URI. The history is provided by the patient.  URI Presenting symptoms: congestion, cough, ear pain and rhinorrhea   Presenting symptoms: no fever   Severity:  Mild Onset quality:  Gradual Duration:  5 days Progression:  Worsening Chronicity:  New Associated symptoms: sinus pain     Past Medical History  Diagnosis Date  . Migraine, unspecified, without mention of intractable migraine without mention of status migrainosus   . Overweight(278.02)   . PVC (premature ventricular contraction)   . Tachycardia, unspecified   . Bilateral ovarian cysts   . Viral meningitis   . Glioblastoma multiforme 2007    Followed by Dr. Vedia Coffer at [facility]  . IBS (irritable bowel syndrome)   . Depression     Migraines  . Brain cancer 10/09/05    glioblastoma right temp/occipital  . DDD (degenerative disc disease), lumbar   . Hypertension     benign  . Microcytic anemia     iron def  2ndary chronic blood loss  . Anemia   . Obesity    Past Surgical History  Procedure Laterality Date  . Abdominal hysterectomy    . Bilateral salpingoophorectomy    . Bladder surgery      tack  . Back surgery      lumbar  . Resection of glioblastoma multiform    . Portacath placement    . Cystocele repair    . Cesarean section      x 2    Family History  Problem Relation Age of Onset  . Cancer Other     tongue  . Heart disease Mother     bypass age 63  . Heart disease Father     heart failure, atrial fib  . Diabetes Father   . Liver disease Brother   . Diabetes Brother   . Colon cancer Neg Hx   . Diabetes Sister    History  Substance Use Topics  . Smoking status: Never Smoker   .  Smokeless tobacco: Never Used  . Alcohol Use: Yes     Comment: Rare-Social    OB History    No data available     Review of Systems  Constitutional: Negative for fever.  HENT: Positive for congestion, ear pain, postnasal drip, rhinorrhea and sinus pressure.   Respiratory: Positive for cough.   Cardiovascular: Negative.   Gastrointestinal: Negative.     Allergies  Morphine; Ondansetron; Pseudoephedrine; Latex; Red dye; Silver; and Tape  Home Medications   Prior to Admission medications   Medication Sig Start Date End Date Taking? Authorizing Provider  amoxicillin-clavulanate (AUGMENTIN) 875-125 MG per tablet Take 1 tablet by mouth 2 (two) times daily. 09/24/14   Billy Fischer, MD  Brimonidine Tartrate (MIRVASO) 0.33 % GEL Apply topically.    Historical Provider, MD  Calcium Carb-Ergocalciferol 250-125 MG-UNIT TABS Take 1 tablet by mouth daily. 10/06/13 10/06/14  Historical Provider, MD  cholecalciferol (VITAMIN D) 1000 UNITS tablet Take 1,000 Units by mouth daily.    Historical Provider, MD  estradiol (ESTRACE) 2 MG tablet Take 2 mg by mouth daily.    Historical Provider, MD  HYDROcodone-acetaminophen (NORCO/VICODIN) 5-325 MG per tablet Take 1 tablet by mouth every 6 (six)  hours as needed.  07/03/13   Historical Provider, MD  ipratropium (ATROVENT) 0.06 % nasal spray Place 2 sprays into both nostrils 4 (four) times daily. 09/24/14   Billy Fischer, MD  LORazepam (ATIVAN) 1 MG tablet Take 1 mg by mouth every 6 (six) hours as needed. For anxiety    Historical Provider, MD  magnesium oxide (MAG-OX) 400 MG tablet Take 400 mg by mouth daily.    Historical Provider, MD  Magnesium Oxide -Mg Supplement 250 MG TABS Take 250 mg by mouth daily.     Historical Provider, MD  metoprolol tartrate (LOPRESSOR) 25 MG tablet TAKE 1 TABLET BY MOUTH TWICE DAILY 05/28/14   Lucille Passy, MD  metroNIDAZOLE (METROCREAM) 0.75 % cream Apply 1 application topically daily.    Historical Provider, MD  ondansetron  (ZOFRAN) 8 MG tablet Take 8 mg by mouth as needed. 11/02/13   Historical Provider, MD  oxyCODONE-acetaminophen (PERCOCET) 5-325 MG per tablet Take 2 tablets by mouth every 4 (four) hours as needed for pain. 11/22/12   Nat Christen, MD  pantoprazole (PROTONIX) 40 MG tablet Take 1 tablet (40 mg total) by mouth daily. Take by mouth. Take 40 mg by mouth daily. 05/28/14   Lucille Passy, MD  promethazine (PHENERGAN) 25 MG tablet 25 mg. Take 1 tablet (25 mg total) by mouth every 4 (four) hours as needed. 12/25/12   Historical Provider, MD  Riboflavin 400 MG CAPS Take 400 mg by mouth daily.    Historical Provider, MD  rizatriptan (MAXALT) 10 MG tablet Take 10 mg by mouth 2 (two) times daily as needed for migraine. May repeat in 2 hours if needed    Historical Provider, MD   BP 134/66 mmHg  Pulse 65  Temp(Src) 97.9 F (36.6 C) (Oral)  Resp 14  SpO2 96% Physical Exam  Constitutional: She is oriented to person, place, and time. She appears well-developed and well-nourished. No distress.  HENT:  Right Ear: Ear canal normal. Tympanic membrane is injected, erythematous and retracted. Tympanic membrane mobility is abnormal.  Left Ear: Hearing, tympanic membrane, external ear and ear canal normal.  Mouth/Throat: Oropharynx is clear and moist.  Eyes: Conjunctivae are normal. Pupils are equal, round, and reactive to light.  Neck: Normal range of motion. Neck supple.  Cardiovascular: Regular rhythm and normal heart sounds.   Pulmonary/Chest: Effort normal and breath sounds normal.  Lymphadenopathy:    She has no cervical adenopathy.  Neurological: She is alert and oriented to person, place, and time.  Skin: Skin is warm and dry.  Nursing note and vitals reviewed.   ED Course  Procedures (including critical care time) Labs Review Labs Reviewed - No data to display  Imaging Review No results found.   MDM   1. Acute recurrent maxillary sinusitis       Billy Fischer, MD 09/26/14 [phone]

## 2014-09-24 NOTE — Discharge Instructions (Signed)
Take all of medicine , use tylenol or advil for pain and fever as needed, see your doctor in 10 - 14 days for ear recheck if needed

## 2014-09-24 NOTE — ED Notes (Signed)
C/o URI type symptoms since Monday. Her regular PCP reportedly not in , and she said they could not schedule her for work in appt w another provider today. C/o cough , congestion right ear pain

## 2014-10-26 ENCOUNTER — Ambulatory Visit (INDEPENDENT_AMBULATORY_CARE_PROVIDER_SITE_OTHER): Payer: 59 | Admitting: Neurology

## 2014-10-26 ENCOUNTER — Encounter: Payer: Self-pay | Admitting: Neurology

## 2014-10-26 VITALS — BP 154/83 | HR 67 | Ht 63.0 in | Wt 282.2 lb

## 2014-10-26 DIAGNOSIS — G43709 Chronic migraine without aura, not intractable, without status migrainosus: Secondary | ICD-10-CM

## 2014-10-26 DIAGNOSIS — C719 Malignant neoplasm of brain, unspecified: Secondary | ICD-10-CM

## 2014-10-26 MED ORDER — RIZATRIPTAN BENZOATE 10 MG PO TABS: 10.0000 mg | ORAL_TABLET | Freq: Two times a day (BID) | ORAL | Status: DC | PRN

## 2014-10-26 NOTE — Progress Notes (Signed)
Reason for visit: Migraine headache  [name] [name] is an 54 y.o. female  History of present illness:  [name] [name] is a 54 year old right-handed white female with a history of a glioblastoma multiforme, status post resection. The patient has had chemotherapy that was stopped in June 2015 secondary to development of a peripheral neuropathy with lancinating pains and a Lhermitte's phenomenon. The patient has done well with her headaches. She is reporting some insomnia. She will be having another MRI of the brain in February 2016. She is having on average 2 headaches a month. She is taking Maxalt only for the headache, and doing well. Occasionally, she will take of Vicodin or a Phenergan if there is nausea. She is overall pleased with her headache frequency. The patient continues to work, she rarely misses work. She is able to operate a motor vehicle without difficulty. The patient reports no new numbness or weakness on the face, arms, or legs. There have been no seizure problems.  Past Medical History  Diagnosis Date  . Migraine, unspecified, without mention of intractable migraine without mention of status migrainosus   . Overweight(278.02)   . PVC (premature ventricular contraction)   . Tachycardia, unspecified   . Bilateral ovarian cysts   . Viral meningitis   . Glioblastoma multiforme 2007    Followed by Dr. Vedia Coffer at [facility]  . IBS (irritable bowel syndrome)   . Depression     Migraines  . Brain cancer 10/09/05    glioblastoma right temp/occipital  . DDD (degenerative disc disease), lumbar   . Hypertension     benign  . Microcytic anemia     iron def  2ndary chronic blood loss  . Anemia   . Obesity     Past Surgical History  Procedure Laterality Date  . Abdominal hysterectomy    . Bilateral salpingoophorectomy    . Bladder surgery      tack  . Back surgery      lumbar  . Resection of glioblastoma multiform    . Portacath placement    . Cystocele repair      . Cesarean section      x 2     Family History  Problem Relation Age of Onset  . Cancer Other     tongue  . Heart disease Mother     bypass age 46  . Heart disease Father     heart failure, atrial fib  . Diabetes Father   . Liver disease Brother   . Diabetes Brother   . Colon cancer Neg Hx   . Diabetes Sister     Social history:  reports that she has never smoked. She has never used smokeless tobacco. She reports that she drinks alcohol. She reports that she does not use illicit drugs.    Allergies  Allergen Reactions  . Morphine     Other reaction(s): Bladder Spasms (intolerance) bladder spasms  . Ondansetron Other (See Comments)    Severe headache   . Pseudoephedrine     Other reaction(s): Other (See Comments) SUDAFED=PVC'S  . Latex Rash and Swelling    Per patient discolor of skin on face.  . Red Dye Rash  . Silver Rash    Tegaderm  . Tape Rash and Itching    Adhesive silicone    Medications:  Current Outpatient Prescriptions on File Prior to Visit  Medication Sig Dispense Refill  . Brimonidine Tartrate (MIRVASO) 0.33 % GEL Apply topically.    Marland Kitchen estradiol (  ESTRACE) 2 MG tablet Take 2 mg by mouth daily.    Marland Kitchen HYDROcodone-acetaminophen (NORCO/VICODIN) 5-325 MG per tablet Take 1 tablet by mouth every 6 (six) hours as needed.     Marland Kitchen LORazepam (ATIVAN) 1 MG tablet Take 1 mg by mouth every 6 (six) hours as needed (for MRIs). For anxiety    . magnesium oxide (MAG-OX) 400 MG tablet Take 400 mg by mouth daily.    . metoprolol tartrate (LOPRESSOR) 25 MG tablet TAKE 1 TABLET BY MOUTH TWICE DAILY 180 tablet 0  . metroNIDAZOLE (METROCREAM) 0.75 % cream Apply 1 application topically daily.    . ondansetron (ZOFRAN) 8 MG tablet Take 8 mg by mouth as needed.    Marland Kitchen oxyCODONE-acetaminophen (PERCOCET) 5-325 MG per tablet Take 2 tablets by mouth every 4 (four) hours as needed for pain. 20 tablet 0  . pantoprazole (PROTONIX) 40 MG tablet Take 1 tablet (40 mg total) by mouth daily.  Take by mouth. Take 40 mg by mouth daily. 90 tablet 0  . promethazine (PHENERGAN) 25 MG tablet 25 mg. Take 1 tablet (25 mg total) by mouth every 4 (four) hours as needed.    . Calcium Carb-Ergocalciferol 250-125 MG-UNIT TABS Take 1 tablet by mouth daily.     No current facility-administered medications on file prior to visit.    ROS:  Out of a complete 14 system review of symptoms, the patient complains only of the following symptoms, and all other reviewed systems are negative.  Heat intolerance Migraine headache  Blood pressure 154/83, pulse 67, height 5\' 3"  (1.6 m), weight 282 lb 3.2 oz (128.005 kg).  Physical Exam  General: The patient is alert and cooperative at the time of the examination. The patient is markedly obese.  Skin: No significant peripheral edema is noted.   Neurologic Exam  Mental status: The patient is oriented x 3.  Cranial nerves: Facial symmetry is present. Speech is normal, no aphasia or dysarthria is noted. Extraocular movements are full. Visual fields are full.  Motor: The patient has good strength in all 4 extremities.  Sensory examination: Soft touch sensation is slightly decreased on the right thigh compared to the left, otherwise symmetric on the arms and face.  Coordination: The patient has good finger-nose-finger and heel-to-shin bilaterally.  Gait and station: The patient has a normal gait. Tandem gait is normal. Romberg is negative. No drift is seen.  Reflexes: Deep tendon reflexes are symmetric.   Assessment/Plan:  1. Migraine headache  2. Glioblastoma multiforme  The patient doing quite well with her current migraine headaches. We will continue the Maxalt, a prescription was sent in. She will follow-up in 6 months or as needed. The patient indicates that she needs an FMLA form filled out.  Jill Alexanders MD 10/26/2014 8:53 AM  [facility] [address] [place], [place] [address]  Phone  [phone] Fax [phone]

## 2014-10-26 NOTE — Patient Instructions (Signed)

## 2015-02-04 ENCOUNTER — Ambulatory Visit (INDEPENDENT_AMBULATORY_CARE_PROVIDER_SITE_OTHER): Payer: 59 | Admitting: Internal Medicine

## 2015-02-04 ENCOUNTER — Encounter: Payer: Self-pay | Admitting: Internal Medicine

## 2015-02-04 VITALS — BP 138/82 | HR 78 | Temp 98.0°F | Wt 283.5 lb

## 2015-02-04 DIAGNOSIS — R0982 Postnasal drip: Secondary | ICD-10-CM | POA: Diagnosis not present

## 2015-02-04 DIAGNOSIS — J029 Acute pharyngitis, unspecified: Secondary | ICD-10-CM | POA: Diagnosis not present

## 2015-02-04 NOTE — Progress Notes (Signed)
Subjective:    Patient ID: [name] [name], female    DOB: Jul 04, 1961, 54 y.o.   MRN: [id number]  HPI  Pt presents to the clinic today with c/o sore throat. This started 1 month ago but reports her symptoms have gotten worse. She denies runny nose, ear fullness, cough or chest congestion. She denies fever, chills or body aches. She did have sinus symptoms which seems to have resolved except for the sore throat. She has done warm salt gargles with mild relief. She has a history of allergies but does not take anything daily for this. She has not had sick contacts.  Review of Systems      Past Medical History  Diagnosis Date  . Migraine, unspecified, without mention of intractable migraine without mention of status migrainosus   . Overweight(278.02)   . PVC (premature ventricular contraction)   . Tachycardia, unspecified   . Bilateral ovarian cysts   . Viral meningitis   . Glioblastoma multiforme 2007    Followed by Dr. Vedia Coffer at [facility]  . IBS (irritable bowel syndrome)   . Depression     Migraines  . Brain cancer 10/09/05    glioblastoma right temp/occipital  . DDD (degenerative disc disease), lumbar   . Hypertension     benign  . Microcytic anemia     iron def  2ndary chronic blood loss  . Anemia   . Obesity     Current Outpatient Prescriptions  Medication Sig Dispense Refill  . Brimonidine Tartrate (MIRVASO) 0.33 % GEL Apply topically.    . Calcium Carb-Ergocalciferol 250-125 MG-UNIT TABS Take 1 tablet by mouth daily.    Marland Kitchen estradiol (ESTRACE) 2 MG tablet Take 2 mg by mouth daily.    Marland Kitchen HYDROcodone-acetaminophen (NORCO/VICODIN) 5-325 MG per tablet Take 1 tablet by mouth every 6 (six) hours as needed.     Marland Kitchen LORazepam (ATIVAN) 1 MG tablet Take 1 mg by mouth every 6 (six) hours as needed (for MRIs). For anxiety    . magnesium oxide (MAG-OX) 400 MG tablet Take 400 mg by mouth daily.    . metoprolol tartrate (LOPRESSOR) 25 MG tablet TAKE 1 TABLET BY MOUTH TWICE DAILY  180 tablet 0  . metroNIDAZOLE (METROCREAM) 0.75 % cream Apply 1 application topically daily.    . ondansetron (ZOFRAN) 8 MG tablet Take 8 mg by mouth as needed.    Marland Kitchen oxyCODONE-acetaminophen (PERCOCET) 5-325 MG per tablet Take 2 tablets by mouth every 4 (four) hours as needed for pain. 20 tablet 0  . pantoprazole (PROTONIX) 40 MG tablet Take 1 tablet (40 mg total) by mouth daily. Take by mouth. Take 40 mg by mouth daily. 90 tablet 0  . promethazine (PHENERGAN) 25 MG tablet 25 mg. Take 1 tablet (25 mg total) by mouth every 4 (four) hours as needed.    . rizatriptan (MAXALT) 10 MG tablet Take 1 tablet (10 mg total) by mouth 2 (two) times daily as needed for migraine. May repeat in 2 hours if needed 10 tablet 5   No current facility-administered medications for this visit.    Allergies  Allergen Reactions  . Morphine     Other reaction(s): Bladder Spasms (intolerance) bladder spasms  . Ondansetron Other (See Comments)    Severe headache   . Pseudoephedrine     Other reaction(s): Other (See Comments) SUDAFED=PVC'S  . Latex Rash and Swelling    Per patient discolor of skin on face.  . Red Dye Rash  . Silver Rash  Tegaderm  . Tape Rash and Itching    Adhesive silicone    Family History  Problem Relation Age of Onset  . Cancer Other     tongue  . Heart disease Mother     bypass age 56  . Heart disease Father     heart failure, atrial fib  . Diabetes Father   . Liver disease Brother   . Diabetes Brother   . Colon cancer Neg Hx   . Diabetes Sister     History   Social History  . Marital Status: Married    Spouse Name: N/A  . Number of Children: N/A  . Years of Education: college   Occupational History  . 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 Smoking status: Never Smoker   . Smokeless tobacco: Never Used  . Alcohol Use: Yes     Comment: Rare-Social   . Drug Use: No  . Sexual Activity: Yes   Other Topics Concern  . Not on file   Social  History Narrative   Daily caffeine      Constitutional: Pt reports headache. Denies fever, malaise, fatigue,  or abrupt weight changes.  HEENT: Pt reports sore throat. Denies eye pain, eye redness, ear pain, ringing in the ears, wax buildup, runny nose, nasal congestion, bloody nose. Respiratory: Denies difficulty breathing, shortness of breath, cough or sputum production.   Cardiovascular: Denies chest pain, chest tightness, palpitations or swelling in the hands or feet.  Gastrointestinal: Denies abdominal pain, bloating, constipation, diarrhea or blood in the stool.   No other specific complaints in a complete review of systems (except as listed in HPI above).  Objective:   Physical Exam   BP 138/82 mmHg  Pulse 78  Temp(Src) 98 F (36.7 C) (Oral)  Wt 283 lb 8 oz (128.595 kg)  SpO2 98% Wt Readings from Last 3 Encounters:  02/04/15 283 lb 8 oz (128.595 kg)  10/26/14 282 lb 3.2 oz (128.005 kg)  05/28/14 273 lb 12 oz (124.172 kg)    General: Appears her stated age, obese in NAD. Skin: Warm, dry and intact. No rashes, lesions or ulcerations noted. HEENT: Head: normal shape and size, no sinus tenderness noted; Eyes: sclera white, no icterus, conjunctiva pink; Ears: Tm's gray and intact, normal light reflex; Nose: mucosa pink and moist, septum midline; Throat/Mouth: Teeth present, mucosa pink and moist, no exudate, lesions or ulcerations noted.  Neck: No adenopathy noted.  Cardiovascular: Normal rate and rhythm. S1,S2 noted.  No murmur, rubs or gallops noted.  Pulmonary/Chest: Normal effort and positive vesicular breath sounds. No respiratory distress. No wheezes, rales or ronchi noted.   BMET    Component Value Date/Time   NA 143 01/05/2014 0823   NA 141 01/12/2009 1445   K 4.5 01/05/2014 0823   K 4.4 01/12/2009 1445   CL 105 01/12/2013 0854   CL 107 01/12/2009 1445   CO2 24 01/05/2014 0823   CO2 27 01/12/2009 1445   GLUCOSE 144* 01/05/2014 0823   GLUCOSE 91 01/12/2013  0854   GLUCOSE 83 01/12/2009 1445   BUN 9.6 01/05/2014 0823   BUN 9 01/12/2009 1445   CREATININE 0.9 01/05/2014 0823   CREATININE 0.68 01/12/2009 1445   CALCIUM 10.0 01/05/2014 0823   CALCIUM 9.6 01/12/2009 1445   GFRNONAA >60 01/12/2009 1445   GFRAA  01/12/2009 1445    >60        The eGFR has been calculated using the MDRD equation. This  calculation has not been validated in all clinical situations. eGFR's persistently <60 mL/min signify possible Chronic Kidney Disease.    Lipid Panel  No results found for: CHOL, TRIG, HDL, CHOLHDL, VLDL, LDLCALC  CBC    Component Value Date/Time   WBC 8.4 10/27/2013 1455   WBC 7.4 01/12/2009 1445   RBC 4.17 10/27/2013 1455   RBC 4.34 01/12/2009 1445   HGB 11.9 10/27/2013 1455   HGB 12.3 01/12/2009 1445   HCT 35.9 10/27/2013 1455   HCT 35.7* 01/12/2009 1445   PLT 233 10/27/2013 1455   PLT 281 01/12/2009 1445   MCV 86.2 10/27/2013 1455   MCV 82.4 01/12/2009 1445   MCH 28.6 10/27/2013 1455   MCHC 33.1 10/27/2013 1455   MCHC 34.4 01/12/2009 1445   RDW 13.7 10/27/2013 1455   RDW 14.6 01/12/2009 1445   LYMPHSABS 1.7 10/27/2013 1455   LYMPHSABS 2.6 01/12/2009 1445   MONOABS 0.5 10/27/2013 1455   MONOABS 0.5 01/12/2009 1445   EOSABS 0.4 10/27/2013 1455   EOSABS 0.2 01/12/2009 1445   BASOSABS 0.1 10/27/2013 1455   BASOSABS 0.1 01/12/2009 1445    Hgb A1C No results found for: HGBA1C      Assessment & Plan:   Sore throat secondary to post nasal drip:  Try Zyrtec daily at night OTC Ibuprofen as needed for pain OK to continue salt water gargles  RTC as needed or if symptoms persist or worsen

## 2015-02-04 NOTE — Patient Instructions (Signed)

## 2015-02-04 NOTE — Progress Notes (Signed)
Pre visit review using our clinic review tool, if applicable. No additional management support is needed unless otherwise documented below in the visit note. 

## 2015-03-14 ENCOUNTER — Ambulatory Visit (INDEPENDENT_AMBULATORY_CARE_PROVIDER_SITE_OTHER): Payer: 59 | Admitting: Family Medicine

## 2015-03-14 ENCOUNTER — Encounter: Payer: Self-pay | Admitting: Family Medicine

## 2015-03-14 DIAGNOSIS — F410 Panic disorder [episodic paroxysmal anxiety] without agoraphobia: Secondary | ICD-10-CM | POA: Insufficient documentation

## 2015-03-14 MED ORDER — DIAZEPAM 5 MG PO TABS: 5.0000 mg | ORAL_TABLET | Freq: Two times a day (BID) | ORAL | Status: DC | PRN

## 2015-03-14 NOTE — Progress Notes (Addendum)
Subjective:   Patient ID: [name] [name], female    DOB: 01/06/1961, 54 y.o.   MRN: [id number]  [name] [name] is a pleasant 54 y.o. year old female who presents to clinic today with Weight Loss  on 03/14/2015  HPI:  Had been in process of looking into bariatric surgery twice in past but GBM recurrences.   Now that she is in remission and wants to proceed with it again.   In past has tried and failed weight watchers x 3, nutrisystem, Metoff metabolites, slim fast.  Admits to over eat.  Interested in Gastric Sleeve.  Panic attacks- new since she started clonidine for her rosacea but has also been under more stress.  Takes prn ativan for her MRIs.  Cannot take it regularly- causes amnesia.  Has tried valium in past. Current Outpatient Prescriptions on File Prior to Visit  Medication Sig Dispense Refill  . estradiol (ESTRACE) 2 MG tablet Take 2 mg by mouth daily.    Marland Kitchen HYDROcodone-acetaminophen (NORCO/VICODIN) 5-325 MG per tablet Take 1 tablet by mouth every 6 (six) hours as needed.     Marland Kitchen LORazepam (ATIVAN) 1 MG tablet Take 1 mg by mouth every 6 (six) hours as needed (for MRIs). For anxiety    . magnesium oxide (MAG-OX) 400 MG tablet Take 400 mg by mouth 2 (two) times daily.     . metoprolol tartrate (LOPRESSOR) 25 MG tablet TAKE 1 TABLET BY MOUTH TWICE DAILY 180 tablet 0  . ondansetron (ZOFRAN) 8 MG tablet Take 8 mg by mouth as needed.    Marland Kitchen oxyCODONE-acetaminophen (PERCOCET) 5-325 MG per tablet Take 2 tablets by mouth every 4 (four) hours as needed for pain. 20 tablet 0  . pantoprazole (PROTONIX) 40 MG tablet Take 1 tablet (40 mg total) by mouth daily. Take by mouth. Take 40 mg by mouth daily. 90 tablet 0  . promethazine (PHENERGAN) 25 MG tablet 25 mg. Take 1 tablet (25 mg total) by mouth every 4 (four) hours as needed.    . rizatriptan (MAXALT) 10 MG tablet Take 1 tablet (10 mg total) by mouth 2 (two) times daily as needed for migraine. May repeat in 2 hours if needed 10  tablet 5  . Brimonidine Tartrate (MIRVASO) 0.33 % GEL Apply topically.    . Calcium Carb-Ergocalciferol 250-125 MG-UNIT TABS Take 1 tablet by mouth daily.    . metroNIDAZOLE (METROCREAM) 0.75 % cream Apply 1 application topically daily.     No current facility-administered medications on file prior to visit.    Allergies  Allergen Reactions  . Morphine     Other reaction(s): Bladder Spasms (intolerance) bladder spasms  . Ondansetron Other (See Comments)    Severe headache   . Pseudoephedrine     Other reaction(s): Other (See Comments) SUDAFED=PVC'S  . Latex Rash and Swelling    Per patient discolor of skin on face.  . Red Dye Rash  . Silver Rash    Tegaderm  . Tape Rash and Itching    Adhesive silicone    Past Medical History  Diagnosis Date  . Migraine, unspecified, without mention of intractable migraine without mention of status migrainosus   . Overweight(278.02)   . PVC (premature ventricular contraction)   . Tachycardia, unspecified   . Bilateral ovarian cysts   . Viral meningitis   . Glioblastoma multiforme 2007    Followed by Dr. Vedia Coffer at [facility]  . IBS (irritable bowel syndrome)   . Depression     Migraines  .  Brain cancer 10/09/05    glioblastoma right temp/occipital  . DDD (degenerative disc disease), lumbar   . Hypertension     benign  . Microcytic anemia     iron def  2ndary chronic blood loss  . Anemia   . Obesity     Past Surgical History  Procedure Laterality Date  . Abdominal hysterectomy    . Bilateral salpingoophorectomy    . Bladder surgery      tack  . Back surgery      lumbar  . Resection of glioblastoma multiform    . Portacath placement    . Cystocele repair    . Cesarean section      x 2     Family History  Problem Relation Age of Onset  . Cancer Other     tongue  . Heart disease Mother     bypass age 46  . Heart disease Father     heart failure, atrial fib  . Diabetes Father   . Liver disease Brother   . Diabetes  Brother   . Colon cancer Neg Hx   . Diabetes Sister     History   Social History  . Marital Status: Married    Spouse Name: N/A  . Number of Children: N/A  . Years of Education: college   Occupational History  . 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 Smoking status: Never Smoker   . Smokeless tobacco: Never Used  . Alcohol Use: Yes     Comment: Rare-Social   . Drug Use: No  . Sexual Activity: Yes   Other Topics Concern  . Not on file   Social History Narrative   Daily caffeine    The PMH, PSH, Social History, Family History, Medications, and allergies have been reviewed in [facility], and have been updated if relevant.   Review of Systems  Constitutional: Negative.   Respiratory: Negative.   Cardiovascular: Negative.   Genitourinary: Negative.   Musculoskeletal: Negative.   Neurological: Negative.   Hematological: Negative.   Psychiatric/Behavioral: Negative for suicidal ideas, sleep disturbance and self-injury. The patient is nervous/anxious.   All other systems reviewed and are negative.      Objective:    BP 156/92 mmHg  Pulse 91  Temp(Src) 98.3 F (36.8 C) (Oral)  Ht 5\' 3"  (1.6 m)  Wt 291 lb 8 oz (132.224 kg)  BMI 51.65 kg/m2  SpO2 97%   BP Readings from Last 3 Encounters:  03/14/15 156/92  02/04/15 138/82  10/26/14 154/83     Wt Readings from Last 3 Encounters:  03/14/15 291 lb 8 oz (132.224 kg)  02/04/15 283 lb 8 oz (128.595 kg)  10/26/14 282 lb 3.2 oz (128.005 kg)    Physical Exam  Constitutional: She is oriented to person, place, and time. She appears well-developed and well-nourished. No distress.  HENT:  Head: Normocephalic.  Eyes: Conjunctivae are normal.  Cardiovascular: Normal rate.   Pulmonary/Chest: Effort normal.  Neurological: She is alert and oriented to person, place, and time. No cranial nerve deficit.  Skin: Skin is warm and dry.  Psychiatric: She has a normal mood and affect. Her behavior is normal.  Judgment and thought content normal.  Nursing note and vitals reviewed.         Assessment & Plan:   Severe obesity (BMI >= 40) - Plan: Ambulatory referral to General Surgery No Follow-up on file.

## 2015-03-14 NOTE — Addendum Note (Signed)
Addended by: Lucille Passy on: 03/14/2015 04:18 PM   Modules accepted: Orders

## 2015-03-14 NOTE — Patient Instructions (Signed)
Great to see you. Please stop by to see Linda on your way out. 

## 2015-03-14 NOTE — Assessment & Plan Note (Signed)
Deteriorated. >25 minutes spent in face to face time with patient, >50% spent in counselling or coordination of care Refer to [facility] for bariatric surgery- gastric sleeve. Referral placed.

## 2015-03-14 NOTE — Progress Notes (Signed)
Pre visit review using our clinic review tool, if applicable. No additional management support is needed unless otherwise documented below in the visit note. 

## 2015-03-14 NOTE — Assessment & Plan Note (Signed)
New-  >25 minutes spent in face to face time with patient, >50% spent in counselling or coordination of care  She is not interested in daily SSRI.  Rx given for prn valium.  She is aware of sedation and addiction potential. Call or return to clinic prn if these symptoms worsen or fail to improve as anticipated. The patient indicates understanding of these issues and agrees with the plan.

## 2015-04-14 ENCOUNTER — Other Ambulatory Visit: Payer: Self-pay

## 2015-04-15 NOTE — Addendum Note (Signed)
Addended by: Johnathan Hausen on: 04/15/2015 12:44 PM   Modules accepted: Orders

## 2015-04-21 ENCOUNTER — Other Ambulatory Visit: Payer: Self-pay | Admitting: General Surgery

## 2015-04-21 DIAGNOSIS — Z1231 Encounter for screening mammogram for malignant neoplasm of breast: Secondary | ICD-10-CM

## 2015-04-22 ENCOUNTER — Other Ambulatory Visit: Payer: Self-pay

## 2015-04-22 DIAGNOSIS — Z1231 Encounter for screening mammogram for malignant neoplasm of breast: Secondary | ICD-10-CM

## 2015-04-26 ENCOUNTER — Encounter: Payer: Self-pay | Admitting: Adult Health

## 2015-04-26 ENCOUNTER — Ambulatory Visit (INDEPENDENT_AMBULATORY_CARE_PROVIDER_SITE_OTHER): Payer: 59 | Admitting: Adult Health

## 2015-04-26 VITALS — BP 134/77 | HR 87 | Ht 63.0 in | Wt 292.0 lb

## 2015-04-26 DIAGNOSIS — C719 Malignant neoplasm of brain, unspecified: Secondary | ICD-10-CM

## 2015-04-26 DIAGNOSIS — G43009 Migraine without aura, not intractable, without status migrainosus: Secondary | ICD-10-CM

## 2015-04-26 NOTE — Patient Instructions (Signed)
Continue Maxalt.  If your symptoms worsen or you develop new symptoms please let us know.

## 2015-04-26 NOTE — Progress Notes (Signed)
PATIENT: [name] [name] DOB: August 14, 1961  REASON FOR VISIT: follow up- glioblastoma, migraines HISTORY FROM: patient  HISTORY OF PRESENT ILLNESS: [name] [name] is a 54 year old female with a history of glioblastoma multiform and associated migraines. She returns today for follow-up. The patient states that her migraines have been controlled. She's not had a migraine since last month. She states that she can sometimes go several months without having a severe migraine. She states that when she does get a severe migraine is usually located behind one of the eyes. She has photophobia and phonophobia as well as nausea and vomiting. She states that Maxalt works intermittently. If she does not get any relief from Maxalt she will take Vicodin. The patient states that since November she has only had to miss two days of work due to migraine headaches. She is currently not on any treatment for her glioblastoma. She has a follow-up scan in August. She denies any new neurological symptoms. Returns today for an evaluation. HISTORY 10/26/14: [name] [name] is a 54 year old right-handed white female with a history of a glioblastoma multiforme, status post resection. The patient has had chemotherapy that was stopped in June 2015 secondary to development of a peripheral neuropathy with lancinating pains and a Lhermitte's phenomenon. The patient has done well with her headaches. She is reporting some insomnia. She will be having another MRI of the brain in February 2016. She is having on average 2 headaches a month. She is taking Maxalt only for the headache, and doing well. Occasionally, she will take of Vicodin or a Phenergan if there is nausea. She is overall pleased with her headache frequency. The patient continues to work, she rarely misses work. She is able to operate a motor vehicle without difficulty. The patient reports no new numbness or weakness on the face, arms, or legs. There have been no seizure  problems./  REVIEW OF SYSTEMS: Out of a complete 14 system review of symptoms, the patient complains only of the following symptoms, and all other reviewed systems are negative.  Memory loss, headache, back pain, fatigue, heat intolerance  ALLERGIES: Allergies  Allergen Reactions  . Morphine     Other reaction(s): Bladder Spasms (intolerance) bladder spasms  . Ondansetron Other (See Comments)    Severe headache   . Pseudoephedrine     Other reaction(s): Other (See Comments) SUDAFED=PVC'S  . Latex Rash and Swelling    Per patient discolor of skin on face.  . Red Dye Rash  . Silver Rash    Tegaderm  . Tape Rash and Itching    Adhesive silicone    HOME MEDICATIONS: Outpatient Prescriptions Prior to Visit  Medication Sig Dispense Refill  . cloNIDine (CATAPRES) 0.1 MG tablet Take 0.1 mg by mouth 2 (two) times daily.    . diazepam (VALIUM) 5 MG tablet Take 1 tablet (5 mg total) by mouth every 12 (twelve) hours as needed for anxiety. 30 tablet 1  . estradiol (ESTRACE) 2 MG tablet Take 2 mg by mouth daily.    Marland Kitchen HYDROcodone-acetaminophen (NORCO/VICODIN) 5-325 MG per tablet Take 1 tablet by mouth every 6 (six) hours as needed.     Marland Kitchen LORazepam (ATIVAN) 1 MG tablet Take 1 mg by mouth every 6 (six) hours as needed (for MRIs). For anxiety    . magnesium oxide (MAG-OX) 400 MG tablet Take 400 mg by mouth 2 (two) times daily.     . metoprolol tartrate (LOPRESSOR) 25 MG tablet TAKE 1 TABLET BY MOUTH TWICE DAILY 180  tablet 0  . metroNIDAZOLE (METROCREAM) 0.75 % cream Apply 1 application topically daily.    . ondansetron (ZOFRAN) 8 MG tablet Take 8 mg by mouth as needed.    Marland Kitchen oxyCODONE-acetaminophen (PERCOCET) 5-325 MG per tablet Take 2 tablets by mouth every 4 (four) hours as needed for pain. 20 tablet 0  . pantoprazole (PROTONIX) 40 MG tablet Take 1 tablet (40 mg total) by mouth daily. Take by mouth. Take 40 mg by mouth daily. 90 tablet 0  . promethazine (PHENERGAN) 25 MG tablet 25 mg. Take 1  tablet (25 mg total) by mouth every 4 (four) hours as needed.    . rizatriptan (MAXALT) 10 MG tablet Take 1 tablet (10 mg total) by mouth 2 (two) times daily as needed for migraine. May repeat in 2 hours if needed 10 tablet 5  . Brimonidine Tartrate (MIRVASO) 0.33 % GEL Apply topically.    . Calcium Carb-Ergocalciferol 250-125 MG-UNIT TABS Take 1 tablet by mouth daily.     No facility-administered medications prior to visit.    PAST MEDICAL HISTORY: Past Medical History  Diagnosis Date  . Migraine, unspecified, without mention of intractable migraine without mention of status migrainosus   . Overweight(278.02)   . PVC (premature ventricular contraction)   . Tachycardia, unspecified   . Bilateral ovarian cysts   . Viral meningitis   . Glioblastoma multiforme 2007    Followed by Dr. Vedia Coffer at [facility]  . IBS (irritable bowel syndrome)   . Depression     Migraines  . Brain cancer 10/09/05    glioblastoma right temp/occipital  . DDD (degenerative disc disease), lumbar   . Hypertension     benign  . Microcytic anemia     iron def  2ndary chronic blood loss  . Anemia   . Obesity     PAST SURGICAL HISTORY: Past Surgical History  Procedure Laterality Date  . Abdominal hysterectomy    . Bilateral salpingoophorectomy    . Bladder surgery      tack  . Back surgery      lumbar  . Resection of glioblastoma multiform    . Portacath placement    . Cystocele repair    . Cesarean section      x 2     FAMILY HISTORY: Family History  Problem Relation Age of Onset  . Cancer Other     tongue  . Heart disease Mother     bypass age 40  . Heart disease Father     heart failure, atrial fib  . Diabetes Father   . Liver disease Brother   . Diabetes Brother   . Colon cancer Neg Hx   . Diabetes Sister     SOCIAL HISTORY: History   Social History  . Marital Status: Married    Spouse Name: Synetta Shadow   . Number of Children: 2  . Years of Education: college   Occupational History    .  [employer]    RN   Social History Main Topics  . Smoking status: Never Smoker   . Smokeless tobacco: Never Used  . Alcohol Use: 0.0 oz/week    0 Standard drinks or equivalent per week     Comment: Rare-Social   . Drug Use: No  . Sexual Activity: Yes   Other Topics Concern  . Not on file   Social History Narrative   Patient is married and lives at home with her husband Synetta Shadow).   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   Caffeine one mountain dew diet.       PHYSICAL EXAM  Filed Vitals:   04/26/15 1528  BP: 134/77  Pulse: 87  Height: 5\' 3"  (1.6 m)  Weight: 292 lb (132.45 kg)   Body mass index is 51.74 kg/(m^2).  Generalized: Well developed, in no acute distress   Neurological examination  Mentation: Alert oriented to time, place, history taking. Follows all commands speech and language fluent Cranial nerve II-XII: Pupils were equal round reactive to light. Extraocular movements were full, visual field were full on confrontational test. Facial sensation and strength were normal. Uvula tongue midline. Head turning and shoulder shrug  were normal and symmetric. Motor: The motor testing reveals 5 over 5 strength of all 4 extremities. Good symmetric motor tone is noted throughout.  Sensory: Sensory testing is intact to soft touch on all 4 extremities, numbness on the left lateral thigh due to previous back surgery.. No evidence of extinction is noted.  Coordination: Cerebellar testing reveals good finger-nose-finger and heel-to-shin bilaterally.  Gait and station: Gait is normal. Tandem gait is normal. Romberg is negative. No drift is seen.  Reflexes: Deep tendon reflexes are symmetric and normal bilaterally.   DIAGNOSTIC DATA (LABS, IMAGING, TESTING) - I reviewed patient records, labs, notes, testing and imaging myself where available.     ASSESSMENT AND PLAN 54 y.o. year old female  has a past medical history of Migraine,  unspecified, without mention of intractable migraine without mention of status migrainosus; Overweight(278.02); PVC (premature ventricular contraction); Tachycardia, unspecified; Bilateral ovarian cysts; Viral meningitis; Glioblastoma multiforme (2007); IBS (irritable bowel syndrome); Depression; Brain cancer (10/09/05); DDD (degenerative disc disease), lumbar; Hypertension; Microcytic anemia; Anemia; and Obesity. here with:  1. Migraine headaches 2. Glioblastoma multiform  Overall the patient has remained stable. Her headaches have been controlled. She uses Maxalt for acute migraine treatment. The patient has been under treatment for glioblastoma for the last 10 years which is unremarkable. She is currently not undergoing any treatment and has a follow-up scan in August. Patient advised that if her symptoms worsen or she develops new symptoms she should let us know. Otherwise she will follow-up in 6 months or sooner if needed.   Ward Givens, MSN, NP-C 04/26/2015, 3:39 PM [facility] [address] [place], [place] [address] [phone]  Note: This document was prepared with digital dictation and possible smart phrase technology. Any transcriptional errors that result from this process are unintentional.

## 2015-04-26 NOTE — Progress Notes (Signed)
I have read the note, and I agree with the clinical assessment and plan.  Cylas Falzone KEITH   

## 2015-05-18 ENCOUNTER — Other Ambulatory Visit: Payer: Self-pay

## 2015-05-19 ENCOUNTER — Ambulatory Visit (HOSPITAL_COMMUNITY)
Admission: RE | Admit: 2015-05-19 | Discharge: 2015-05-19 | Disposition: A | Discharge status: Home / Self Care | Payer: 59 | Source: Ambulatory Visit | Attending: Surgery | Admitting: Surgery

## 2015-05-19 ENCOUNTER — Other Ambulatory Visit: Payer: Self-pay

## 2015-05-19 DIAGNOSIS — Z1231 Encounter for screening mammogram for malignant neoplasm of breast: Secondary | ICD-10-CM | POA: Insufficient documentation

## 2015-05-30 ENCOUNTER — Ambulatory Visit: Payer: 59

## 2015-05-31 ENCOUNTER — Ambulatory Visit: Payer: 59

## 2015-06-07 ENCOUNTER — Ambulatory Visit: Payer: 59 | Admitting: Dietician

## 2015-06-08 ENCOUNTER — Encounter: Payer: 59 | Attending: Surgery | Admitting: Dietician

## 2015-06-08 ENCOUNTER — Encounter: Payer: Self-pay | Admitting: Dietician

## 2015-06-08 DIAGNOSIS — Z6841 Body Mass Index (BMI) 40.0 and over, adult: Secondary | ICD-10-CM | POA: Diagnosis not present

## 2015-06-08 DIAGNOSIS — Z713 Dietary counseling and surveillance: Secondary | ICD-10-CM | POA: Insufficient documentation

## 2015-06-08 NOTE — Progress Notes (Signed)
  Pre-Op Assessment Visit:  Pre-Operative Sleeve Gastrectomy Surgery  Medical Nutrition Therapy:  Appt start time: 9211   End time:  9417.  Patient was seen on 06/08/2015 for Pre-Operative Nutrition Assessment. Assessment and letter of approval faxed to [facility] Bariatric Surgery Program coordinator on 06/08/2015.   Preferred Learning Style:   No preference indicated   Learning Readiness:   Ready  Handouts given during visit include:  Pre-Op Goals Bariatric Surgery Protein Shakes   During the appointment today the following Pre-Op Goals were reviewed with the patient: Maintain or lose weight as instructed by your surgeon Make healthy food choices Begin to limit portion sizes Limited concentrated sugars and fried foods Keep fat/sugar in the single digits per serving on   food labels Practice CHEWING your food  (aim for 30 chews per bite or until applesauce consistency) Practice not drinking 15 minutes before, during, and 30 minutes after each meal/snack Avoid all carbonated beverages  Avoid/limit caffeinated beverages  Avoid all sugar-sweetened beverages Consume 3 meals per day; eat every 3-5 hours Make a list of non-food related activities Aim for 64-100 ounces of FLUID daily  Aim for at least 60-80 grams of PROTEIN daily Look for a liquid protein source that contain ?15 g protein and ?5 g carbohydrate  (ex: shakes, drinks, shots)  Patient-Centered Goals: Goals: would like to be able to walk/climb stairs without getting short of breath, fly on plane without seatbelt extender, buy clothes in "normal" store 10 level of confidence/10 level of importance   Demonstrated degree of understanding via:  Teach Back  Teaching Method Utilized:  Visual Auditory Hands on  Barriers to learning/adherence to lifestyle change: none  Patient to call the [facility] to enroll in Pre-Op and Post-Op Nutrition Education when surgery date is  scheduled.

## 2015-06-08 NOTE — Patient Instructions (Signed)

## 2015-06-09 ENCOUNTER — Other Ambulatory Visit: Payer: Self-pay | Admitting: Obstetrics and Gynecology

## 2015-06-10 LAB — CYTOLOGY - PAP

## 2015-07-08 ENCOUNTER — Encounter (HOSPITAL_COMMUNITY)
Admission: RE | Admit: 2015-07-08 | Discharge: 2015-07-08 | Disposition: A | Discharge status: Home / Self Care | Payer: 59 | Source: Ambulatory Visit | Attending: Medical Oncology | Admitting: Medical Oncology

## 2015-07-08 ENCOUNTER — Encounter (HOSPITAL_COMMUNITY): Payer: Self-pay

## 2015-07-08 DIAGNOSIS — Z452 Encounter for adjustment and management of vascular access device: Secondary | ICD-10-CM | POA: Insufficient documentation

## 2015-07-08 DIAGNOSIS — C719 Malignant neoplasm of brain, unspecified: Secondary | ICD-10-CM | POA: Diagnosis not present

## 2015-07-08 MED ORDER — HEPARIN SOD (PORK) LOCK FLUSH 100 UNIT/ML IV SOLN
500.0000 [IU] | INTRAVENOUS | Status: DC | PRN
  Administered 2015-07-08: 500 [IU]
  Filled 2015-07-08: qty 5

## 2015-07-08 MED ORDER — SODIUM CHLORIDE 0.9 % IJ SOLN: 10.0000 mL | INTRAMUSCULAR | Status: DC | PRN

## 2015-08-05 ENCOUNTER — Ambulatory Visit: Payer: Self-pay | Admitting: Surgery

## 2015-08-05 NOTE — H&P (Signed)
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 She comes in today to discuss sleeve gastectomy. Carlyon Shadow has watched all of our bariatric patients in the holding area and is somewhat familiar with the procedures. She has been to our seminars and has started this process twice in the last ten years but was interrupted by GBM brain tumor diagnosed in 2007.  Marland Kitchen Her craniotomies have lead to her delays but now she has the go ahead from her neurosurgeon at [facility]. She is interested in having a sleeve gastrectomy. She weighed 150 lbs when she married in 1985. 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 She has a lot of insight in to this. She continues to work very hard in her job in the holding area at [employer].  She has had prior C sections and an open laparotomy by Dr. Hanish Laraia Saras and she may have a hernia in her left lower quadrant. I discussed the procedure to her in detail. Will move forward with process to gain approval for a sleeve gastrectomy. She does have GERD. Will check her for a hiatus hernia.    Other Problems  Back Pain Bladder Problems Cancer Gastroesophageal Reflux Disease High blood pressure Migraine Headache Oophorectomy  Past Surgical History  Cesarean Section - Multiple Hysterectomy (not due to cancer) - Complete Spinal Surgery - Lower Back  Diagnostic Studies History Colonoscopy 1-5 years ago Mammogram within last year Pap Smear 1-5 years ago  Allergies Morphine Sulfate ER *ANALGESICS - OPIOID* Sudafed PE Sinus/Allergy *COUGH/COLD/ALLERGY* Dye FDC Red 40 (Carmine Red) *PHARMACEUTICAL ADJUVANTS*  Hives. Latex  Medication History  Estradiol (2MG  Tablet, Oral) Active. Protonix (40MG  Packet, Oral) Active. Metoprolol Succinate ER (25MG  Tablet ER 24HR, Oral) Active. CloNIDine HCl (0.1MG  Tablet, Oral two times daily) Active. Flexeril (10MG  Tablet, Oral) Active. Mobic (7.5MG  Tablet, Oral) Active. Vitamin D2 (400UNIT Tablet, Oral) Active. Magnesium Oxide (400MG  Tablet, Oral two times daily) Active. Vicodin (5-300MG  Tablet, Oral as needed) Active. Medications Reconciled  Social History  Caffeine use Carbonated beverages, Tea. No alcohol use No drug use Tobacco use Never smoker.  Family History  Bleeding disorder Sister. Breast Cancer Sister. Cancer Family Members In General. Diabetes Mellitus Brother, Father, Sister. Heart Disease Father, Mother. Hypertension Brother, Father, Mother, Sister. Kidney Disease Brother, Mother. Migraine Headache Daughter, Son. Thyroid problems Brother, Sister.  Pregnancy / Birth History  Age at menarche 50 years. Age of menopause <45 Gravida 2 Maternal age 71-25 Para 2    Review of Systems General Present- Fatigue and Weight Gain. Not Present- Appetite Loss, Chills, Fever, Night Sweats and Weight Loss. Skin Present- Dryness. Not Present- Change in Wart/Mole, Hives, Jaundice, New Lesions, Non-Healing Wounds, Rash and Ulcer. HEENT Present- Seasonal Allergies and Wears glasses/contact lenses. Not Present- Earache, Hearing Loss, Hoarseness, Nose Bleed, Oral Ulcers, Ringing in the Ears, Sinus Pain, Sore Throat, Visual Disturbances and Yellow Eyes. Musculoskeletal Present- Back Pain and Joint Pain. Not Present- Joint Stiffness, Muscle Pain, Muscle Weakness and Swelling of Extremities. Neurological Present- Headaches. Not Present- Decreased Memory, Fainting, Numbness, Seizures, Tingling, Tremor, Trouble walking and Weakness. Endocrine Present- Heat Intolerance and Hot flashes. Not Present- Cold  Intolerance, Excessive  Hunger, Hair Changes and New Diabetes.  Vitals 04/14/2015 9:46 AM Weight: 288 lb Height: 63in Body Surface Area: 2.41 m Body Mass Index: 51.02 kg/m  Temp.: 97.57F(Oral)  Pulse: 81 (Regular)  Resp.: 18 (Unlabored)  BP: 134/78 (Sitting, Left Arm, Standard)  Physical Exam  General Note: HEENT prior craniotomy for GBM Neck supple without bruits Chest clear Heart SR without murmurs Abdomen obese may have LLQ hernia Ext no history of DVT, FROM Neuro no obvious neural defects from her prior craniotomy. exam grossly intact   Assessment & Plan  MORBID OBESITY (278.01  E66.01)  Plan laparoscopic sleeve gastrectomy with probable hiatal hernia repair   Matt B. Hassell Done, MD, [facility] [phone] beeper [phone]  08/05/2015 4:57 PM

## 2015-08-15 ENCOUNTER — Encounter: Payer: 59 | Attending: Surgery

## 2015-08-15 DIAGNOSIS — Z6841 Body Mass Index (BMI) 40.0 and over, adult: Secondary | ICD-10-CM | POA: Diagnosis not present

## 2015-08-15 DIAGNOSIS — Z713 Dietary counseling and surveillance: Secondary | ICD-10-CM | POA: Insufficient documentation

## 2015-08-15 NOTE — Progress Notes (Signed)
  Pre-Operative Nutrition Class:  Appt start time: 6203   End time:  1830.  Patient was seen on 08/15/2015 for Pre-Operative Bariatric Surgery Education at the [facility].   Surgery date: 09/06/15 Surgery type: sleeve gastrectomy Start weight at [facility]: 285 lbs on 06/08/15 Weight today: 284 lbs  TANITA  BODY COMP RESULTS  08/15/15   BMI (kg/m^2) 50.3   Fat Mass (lbs) 149.5   Fat Free Mass (lbs) 134.5   Total Body Water (lbs) 98.5   Samples given per MNT protocol. Patient educated on appropriate usage: Premier protein shake (vanilla - qty 1) Lot #: [id number] Exp: 07/2016  Unjury protein powder (unflavored - qty 1) Lot #: [id number] Exp: 04/2016  Celebrate Vitamins Multivitamin (orange - qty 1) Lot #: [id number] Exp: 12/2016  Celebrate Vitamins Calcium Citrate (berry - qty 1) Lot #: [id number] Exp: 12/2016  PB2 (chocolate - qty 1) Lot #: N/A Exp: 08/20/15   The following the learning objectives were met by the patient during this course:  Identify Pre-Op Dietary Goals and will begin 2 weeks pre-operatively  Identify appropriate sources of fluids and proteins   State protein recommendations and appropriate sources pre and post-operatively  Identify Post-Operative Dietary Goals and will follow for 2 weeks post-operatively  Identify appropriate multivitamin and calcium sources  Describe the need for physical activity post-operatively and will follow MD recommendations  State when to call healthcare provider regarding medication questions or post-operative complications  Handouts given during class include:  Pre-Op Bariatric Surgery Diet Handout  Protein Shake Handout  Post-Op Bariatric Surgery Nutrition Handout  BELT Program Information Flyer  Support Group Information Flyer  [facility] Bariatric Supplements Price List  Follow-Up Plan: Patient will follow-up at [facility] 2 weeks post operatively for diet advancement per MD.

## 2015-08-22 ENCOUNTER — Other Ambulatory Visit (HOSPITAL_COMMUNITY): Payer: 59

## 2015-08-24 NOTE — Patient Instructions (Addendum)
YOUR PROCEDURE IS SCHEDULED ON :  09/06/15  REPORT TO [facility] MAIN ENTRANCE FOLLOW SIGNS TO EAST ELEVATOR - GO TO 3rd FLOOR CHECK IN AT 3 EAST NURSES STATION (SHORT STAY) AT:  5:15 AM  CALL THIS NUMBER IF YOU HAVE PROBLEMS THE MORNING OF SURGERY [phone]  REMEMBER:ONLY 1 PER PERSON MAY GO TO SHORT STAY WITH YOU TO GET READY THE MORNING OF YOUR SURGERY  DO NOT EAT FOOD OR DRINK LIQUIDS AFTER MIDNIGHT  TAKE THESE MEDICINES THE MORNING OF SURGERY: CLONIDINE / METOPROLOL / OMEPRAZOLE / FLEXERIL IF NEEDED  YOU MAY NOT HAVE ANY METAL ON YOUR BODY INCLUDING HAIR PINS AND PIERCING'S. DO NOT WEAR JEWELRY, MAKEUP, LOTIONS, POWDERS OR PERFUMES. DO NOT WEAR NAIL POLISH. DO NOT SHAVE 48 HRS PRIOR TO SURGERY. MEN MAY SHAVE FACE AND NECK.  DO NOT [place]. [place] IS NOT RESPONSIBLE FOR VALUABLES.  CONTACTS, DENTURES OR PARTIALS MAY NOT BE WORN TO SURGERY. LEAVE SUITCASE IN CAR. CAN BE BROUGHT TO ROOM AFTER SURGERY.  PATIENTS DISCHARGED THE DAY OF SURGERY WILL NOT BE ALLOWED TO DRIVE HOME.  PLEASE READ OVER THE FOLLOWING INSTRUCTION SHEETS _________________________________________________________________________________                                          [facility] - PREPARING FOR SURGERY  Before surgery, you can play an important role.  Because skin is not sterile, your skin needs to be as free of germs as possible.  You can reduce the number of germs on your skin by washing with CHG (chlorahexidine gluconate) soap before surgery.  CHG is an antiseptic cleaner which kills germs and bonds with the skin to continue killing germs even after washing. Please DO NOT use if you have an allergy to CHG or antibacterial soaps.  If your skin becomes reddened/irritated stop using the CHG and inform your nurse when you arrive at Short Stay. Do not shave (including legs and underarms) for at least 48 hours prior to the first CHG shower.  You may shave your  face. Please follow these instructions carefully:   1.  Shower with CHG Soap the night before surgery and the  morning of Surgery.   2.  If you choose to wash your hair, wash your hair first as usual with your  normal  Shampoo.   3.  After you shampoo, rinse your hair and body thoroughly to remove the  shampoo.                                         4.  Use CHG as you would any other liquid soap.  You can apply chg directly  to the skin and wash . Gently wash with scrungie or clean wascloth    5.  Apply the CHG Soap to your body ONLY FROM THE NECK DOWN.   Do not use on open                           Wound or open sores. Avoid contact with eyes, ears mouth and genitals (private parts).                        Genitals (  private parts) with your normal soap.              6.  Wash thoroughly, paying special attention to the area where your surgery  will be performed.   7.  Thoroughly rinse your body with warm water from the neck down.   8.  DO NOT shower/wash with your normal soap after using and rinsing off  the CHG Soap .                9.  Pat yourself dry with a clean towel.             10.  Wear clean night clothes to bed after shower             11.  Place clean sheets on your bed the night of your first shower and do not  sleep with pets.  Day of Surgery : Do not apply any lotions/deodorants the morning of surgery.  Please wear clean clothes to the hospital/surgery center.  FAILURE TO FOLLOW THESE INSTRUCTIONS MAY RESULT IN THE CANCELLATION OF YOUR SURGERY    PATIENT SIGNATURE_________________________________  ______________________________________________________________________

## 2015-08-25 ENCOUNTER — Encounter (HOSPITAL_COMMUNITY): Payer: Self-pay

## 2015-08-25 ENCOUNTER — Encounter (HOSPITAL_COMMUNITY)
Admission: RE | Admit: 2015-08-25 | Discharge: 2015-08-25 | Disposition: A | Discharge status: Home / Self Care | Payer: 59 | Source: Ambulatory Visit | Attending: Surgery | Admitting: Surgery

## 2015-08-25 DIAGNOSIS — Z01812 Encounter for preprocedural laboratory examination: Secondary | ICD-10-CM | POA: Diagnosis not present

## 2015-08-25 HISTORY — DX: Gastro-esophageal reflux disease without esophagitis: K21.9

## 2015-08-25 HISTORY — DX: Adverse effect of unspecified anesthetic, initial encounter: T41.45XA

## 2015-08-25 HISTORY — DX: Rash and other nonspecific skin eruption: R21

## 2015-08-25 HISTORY — DX: Other specified disorders of bladder: N32.89

## 2015-08-25 HISTORY — DX: Other complications of anesthesia, initial encounter: T88.59XA

## 2015-08-25 LAB — COMPREHENSIVE METABOLIC PANEL
ALK PHOS: 98 U/L (ref 38–126)
ALT: 38 U/L (ref 14–54)
AST: 42 U/L — AB (ref 15–41)
Albumin: 4.2 g/dL (ref 3.5–5.0)
Anion gap: 8 (ref 5–15)
BILIRUBIN TOTAL: 0.8 mg/dL (ref 0.3–1.2)
BUN: 18 mg/dL (ref 6–20)
CALCIUM: 9.4 mg/dL (ref 8.9–10.3)
CO2: 27 mmol/L (ref 22–32)
CREATININE: 0.82 mg/dL (ref 0.44–1.00)
Chloride: 104 mmol/L (ref 101–111)
GFR calc Af Amer: >60 mL/min (ref >60)
GLUCOSE: 87 mg/dL (ref 65–99)
POTASSIUM: 4.4 mmol/L (ref 3.5–5.1)
Sodium: 139 mmol/L (ref 135–145)
TOTAL PROTEIN: 7.6 g/dL (ref 6.5–8.1)

## 2015-08-25 LAB — CBC WITH DIFFERENTIAL/PLATELET
BASOS ABS: 0 10*3/uL (ref 0.0–0.1)
Basophils Relative: 0 %
Eosinophils Absolute: 0.2 10*3/uL (ref 0.0–0.7)
Eosinophils Relative: 2 %
HEMATOCRIT: 43.7 % (ref 36.0–46.0)
HEMOGLOBIN: 14.2 g/dL (ref 12.0–15.0)
LYMPHS PCT: 32 %
Lymphs Abs: 2.9 10*3/uL (ref 0.7–4.0)
MCH: 28.9 pg (ref 26.0–34.0)
MCHC: 32.5 g/dL (ref 30.0–36.0)
MCV: 89 fL (ref 78.0–100.0)
MONO ABS: 0.5 10*3/uL (ref 0.1–1.0)
MONOS PCT: 6 %
NEUTROS ABS: 5.5 10*3/uL (ref 1.7–7.7)
NEUTROS PCT: 60 %
Platelets: 310 10*3/uL (ref 150–400)
RBC: 4.91 MIL/uL (ref 3.87–5.11)
RDW: 12.8 % (ref 11.5–15.5)
WBC: 9.1 10*3/uL (ref 4.0–10.5)

## 2015-09-05 ENCOUNTER — Encounter (HOSPITAL_COMMUNITY): Payer: Self-pay

## 2015-09-05 ENCOUNTER — Encounter (HOSPITAL_COMMUNITY)
Admission: RE | Admit: 2015-09-05 | Discharge: 2015-09-05 | Disposition: A | Discharge status: Home / Self Care | Payer: 59 | Source: Ambulatory Visit | Attending: Medical Oncology | Admitting: Medical Oncology

## 2015-09-05 ENCOUNTER — Encounter (HOSPITAL_COMMUNITY): Payer: Self-pay | Admitting: Anesthesiology

## 2015-09-05 DIAGNOSIS — C719 Malignant neoplasm of brain, unspecified: Secondary | ICD-10-CM | POA: Diagnosis not present

## 2015-09-05 DIAGNOSIS — Z452 Encounter for adjustment and management of vascular access device: Secondary | ICD-10-CM | POA: Diagnosis not present

## 2015-09-05 MED ORDER — HEPARIN SOD (PORK) LOCK FLUSH 100 UNIT/ML IV SOLN
500.0000 [IU] | INTRAVENOUS | Status: DC | PRN
  Administered 2015-09-05: 500 [IU]
  Filled 2015-09-05: qty 5

## 2015-09-05 MED ORDER — SODIUM CHLORIDE 0.9 % IJ SOLN
10.0000 mL | INTRAMUSCULAR | Status: DC | PRN
  Administered 2015-09-05: 10 mL
  Filled 2015-09-05: qty 10

## 2015-09-05 NOTE — Anesthesia Preprocedure Evaluation (Addendum)
Anesthesia Evaluation  Patient identified by MRN, date of birth, ID band Patient awake    Reviewed: Allergy & Precautions, NPO status , Patient's Chart, lab work & pertinent test results  History of Anesthesia Complications (+) history of anesthetic complications  Airway Mallampati: II  TM Distance: >3 FB Neck ROM: Full    Dental no notable dental hx.    Pulmonary neg pulmonary ROS,    Pulmonary exam normal breath sounds clear to auscultation       Cardiovascular Exercise Tolerance: Good hypertension, Pt. on medications and Pt. on home beta blockers Normal cardiovascular exam Rhythm:Regular Rate:Normal     Neuro/Psych  Headaches, PSYCHIATRIC DISORDERS Anxiety Depression  Neuromuscular disease    GI/Hepatic Neg liver ROS, GERD  Medicated,  Endo/Other  Morbid obesity  Renal/GU negative Renal ROS  negative genitourinary   Musculoskeletal  (+) Arthritis ,   Abdominal (+) + obese,   Peds negative pediatric ROS (+)  Hematology  (+) anemia ,   Anesthesia Other Findings   Reproductive/Obstetrics negative OB ROS                           Anesthesia Physical Anesthesia Plan  ASA: III  Anesthesia Plan: General   Post-op Pain Management:    Induction: Intravenous  Airway Management Planned: Oral ETT  Additional Equipment:   Intra-op Plan:   Post-operative Plan: Extubation in OR  Informed Consent: I have reviewed the patients History and Physical, chart, labs and discussed the procedure including the risks, benefits and alternatives for the proposed anesthesia with the patient or authorized representative who has indicated his/her understanding and acceptance.   Dental advisory given  Plan Discussed with: CRNA  Anesthesia Plan Comments:         Anesthesia Quick Evaluation

## 2015-09-06 ENCOUNTER — Encounter (HOSPITAL_COMMUNITY): Payer: Self-pay | Admitting: Registered Nurse

## 2015-09-06 ENCOUNTER — Encounter (HOSPITAL_COMMUNITY)
Admission: RE | Disposition: A | Discharge status: Home / Self Care | Payer: Self-pay | Source: Ambulatory Visit | Attending: Surgery

## 2015-09-06 ENCOUNTER — Inpatient Hospital Stay (HOSPITAL_COMMUNITY): Payer: 59 | Admitting: Anesthesiology

## 2015-09-06 ENCOUNTER — Inpatient Hospital Stay (HOSPITAL_COMMUNITY)
Admission: RE | Admit: 2015-09-06 | Discharge: 2015-09-08 | DRG: 621 | Disposition: A | Discharge status: Home / Self Care | Payer: 59 | Source: Ambulatory Visit | Attending: Surgery | Admitting: Surgery

## 2015-09-06 DIAGNOSIS — Z79899 Other long term (current) drug therapy: Secondary | ICD-10-CM | POA: Diagnosis not present

## 2015-09-06 DIAGNOSIS — T404X5A Adverse effect of other synthetic narcotics, initial encounter: Secondary | ICD-10-CM | POA: Diagnosis not present

## 2015-09-06 DIAGNOSIS — Z833 Family history of diabetes mellitus: Secondary | ICD-10-CM

## 2015-09-06 DIAGNOSIS — Z01812 Encounter for preprocedural laboratory examination: Secondary | ICD-10-CM | POA: Diagnosis not present

## 2015-09-06 DIAGNOSIS — Z803 Family history of malignant neoplasm of breast: Secondary | ICD-10-CM

## 2015-09-06 DIAGNOSIS — K219 Gastro-esophageal reflux disease without esophagitis: Secondary | ICD-10-CM | POA: Diagnosis present

## 2015-09-06 DIAGNOSIS — Z8249 Family history of ischemic heart disease and other diseases of the circulatory system: Secondary | ICD-10-CM

## 2015-09-06 DIAGNOSIS — Z6841 Body Mass Index (BMI) 40.0 and over, adult: Secondary | ICD-10-CM

## 2015-09-06 DIAGNOSIS — K449 Diaphragmatic hernia without obstruction or gangrene: Secondary | ICD-10-CM | POA: Diagnosis present

## 2015-09-06 DIAGNOSIS — Z85841 Personal history of malignant neoplasm of brain: Secondary | ICD-10-CM

## 2015-09-06 DIAGNOSIS — Z9884 Bariatric surgery status: Secondary | ICD-10-CM

## 2015-09-06 DIAGNOSIS — R110 Nausea: Secondary | ICD-10-CM | POA: Diagnosis not present

## 2015-09-06 DIAGNOSIS — Z9889 Other specified postprocedural states: Secondary | ICD-10-CM | POA: Diagnosis not present

## 2015-09-06 HISTORY — PX: LAPAROSCOPIC GASTRIC SLEEVE RESECTION WITH HIATAL HERNIA REPAIR: SHX6512

## 2015-09-06 HISTORY — PX: UPPER GI ENDOSCOPY: SHX6162

## 2015-09-06 LAB — CBC
HCT: 40.3 % (ref 36.0–46.0)
HEMOGLOBIN: 13.2 g/dL (ref 12.0–15.0)
MCH: 28.4 pg (ref 26.0–34.0)
MCHC: 32.8 g/dL (ref 30.0–36.0)
MCV: 86.9 fL (ref 78.0–100.0)
Platelets: 218 10*3/uL (ref 150–400)
RBC: 4.64 MIL/uL (ref 3.87–5.11)
RDW: 12.9 % (ref 11.5–15.5)
WBC: 9.4 10*3/uL (ref 4.0–10.5)

## 2015-09-06 LAB — HEMOGLOBIN AND HEMATOCRIT, BLOOD
HEMATOCRIT: 38.4 % (ref 36.0–46.0)
HEMOGLOBIN: 12.5 g/dL (ref 12.0–15.0)

## 2015-09-06 LAB — CREATININE, SERUM
CREATININE: 0.77 mg/dL (ref 0.44–1.00)
GFR calc Af Amer: >60 mL/min (ref >60)

## 2015-09-06 SURGERY — GASTRECTOMY, SLEEVE, LAPAROSCOPIC, WITH HIATAL HERNIA REPAIR
Anesthesia: General | Site: Esophagus

## 2015-09-06 MED ORDER — PROMETHAZINE HCL 25 MG/ML IJ SOLN
12.5000 mg | Freq: Four times a day (QID) | INTRAMUSCULAR | Status: DC | PRN
  Administered 2015-09-06 – 2015-09-07 (×2): 25 mg via INTRAVENOUS
  Administered 2015-09-07: 12.5 mg via INTRAVENOUS
  Filled 2015-09-06 (×3): qty 1

## 2015-09-06 MED ORDER — SODIUM CHLORIDE 0.9 % IJ SOLN
INTRAMUSCULAR | Status: AC
  Filled 2015-09-06: qty 20

## 2015-09-06 MED ORDER — EPHEDRINE SULFATE 50 MG/ML IJ SOLN
INTRAMUSCULAR | Status: AC
  Filled 2015-09-06: qty 1

## 2015-09-06 MED ORDER — SODIUM CHLORIDE 0.9 % IJ SOLN
INTRAMUSCULAR | Status: AC
  Filled 2015-09-06: qty 10

## 2015-09-06 MED ORDER — LACTATED RINGERS IR SOLN
Status: DC | PRN
  Administered 2015-09-06: 1000 mL

## 2015-09-06 MED ORDER — MIDAZOLAM HCL 2 MG/2ML IJ SOLN
INTRAMUSCULAR | Status: AC
  Filled 2015-09-06: qty 2

## 2015-09-06 MED ORDER — SUGAMMADEX SODIUM 500 MG/5ML IV SOLN
INTRAVENOUS | Status: AC
  Filled 2015-09-06: qty 5

## 2015-09-06 MED ORDER — LIDOCAINE HCL (CARDIAC) 20 MG/ML IV SOLN
INTRAVENOUS | Status: AC
  Filled 2015-09-06: qty 5

## 2015-09-06 MED ORDER — SUCCINYLCHOLINE CHLORIDE 20 MG/ML IJ SOLN
INTRAMUSCULAR | Status: DC | PRN
  Administered 2015-09-06: 100 mg via INTRAVENOUS

## 2015-09-06 MED ORDER — PROPOFOL 10 MG/ML IV BOLUS
INTRAVENOUS | Status: AC
  Filled 2015-09-06: qty 40

## 2015-09-06 MED ORDER — PROMETHAZINE HCL 25 MG/ML IJ SOLN
INTRAMUSCULAR | Status: AC
  Filled 2015-09-06: qty 1

## 2015-09-06 MED ORDER — HYDROMORPHONE HCL 1 MG/ML IJ SOLN
INTRAMUSCULAR | Status: AC
  Filled 2015-09-06: qty 1

## 2015-09-06 MED ORDER — DEXTROSE 5 % IV SOLN
INTRAVENOUS | Status: AC
  Filled 2015-09-06: qty 2

## 2015-09-06 MED ORDER — DEXAMETHASONE SODIUM PHOSPHATE 10 MG/ML IJ SOLN
INTRAMUSCULAR | Status: DC | PRN
  Administered 2015-09-06: 10 mg via INTRAVENOUS

## 2015-09-06 MED ORDER — ACETAMINOPHEN 160 MG/5ML PO SOLN: 650.0000 mg | ORAL | Status: DC | PRN

## 2015-09-06 MED ORDER — MIDAZOLAM HCL 5 MG/5ML IJ SOLN
INTRAMUSCULAR | Status: DC | PRN
  Administered 2015-09-06: 2 mg via INTRAVENOUS

## 2015-09-06 MED ORDER — ATROPINE SULFATE 0.4 MG/ML IJ SOLN
INTRAMUSCULAR | Status: AC
  Filled 2015-09-06: qty 2

## 2015-09-06 MED ORDER — EPHEDRINE SULFATE 50 MG/ML IJ SOLN
INTRAMUSCULAR | Status: DC | PRN
  Administered 2015-09-06: 10 mg via INTRAVENOUS

## 2015-09-06 MED ORDER — SODIUM CHLORIDE 0.9 % IJ SOLN
INTRAMUSCULAR | Status: DC | PRN
  Administered 2015-09-06: 20 mL

## 2015-09-06 MED ORDER — HEPARIN SODIUM (PORCINE) 5000 UNIT/ML IJ SOLN
5000.0000 [IU] | INTRAMUSCULAR | Status: AC
  Administered 2015-09-06: 5000 [IU] via SUBCUTANEOUS
  Filled 2015-09-06: qty 1

## 2015-09-06 MED ORDER — ACETAMINOPHEN 160 MG/5ML PO SOLN: 325.0000 mg | ORAL | Status: DC | PRN

## 2015-09-06 MED ORDER — ONDANSETRON HCL 4 MG/2ML IJ SOLN
INTRAMUSCULAR | Status: AC
  Filled 2015-09-06: qty 2

## 2015-09-06 MED ORDER — LIDOCAINE HCL (CARDIAC) 20 MG/ML IV SOLN
INTRAVENOUS | Status: DC | PRN
  Administered 2015-09-06: 100 mg via INTRAVENOUS

## 2015-09-06 MED ORDER — PROMETHAZINE HCL 25 MG/ML IJ SOLN: 6.2500 mg | INTRAMUSCULAR | Status: DC | PRN

## 2015-09-06 MED ORDER — PROMETHAZINE HCL 25 MG/ML IJ SOLN
6.2500 mg | INTRAMUSCULAR | Status: AC | PRN
  Administered 2015-09-06 (×2): 12.5 mg via INTRAVENOUS

## 2015-09-06 MED ORDER — HEPARIN SODIUM (PORCINE) 5000 UNIT/ML IJ SOLN
5000.0000 [IU] | Freq: Three times a day (TID) | INTRAMUSCULAR | Status: DC
  Administered 2015-09-06 – 2015-09-08 (×5): 5000 [IU] via SUBCUTANEOUS
  Filled 2015-09-06 (×8): qty 1

## 2015-09-06 MED ORDER — LACTATED RINGERS IV SOLN
INTRAVENOUS | Status: DC | PRN
  Administered 2015-09-06: 07:00:00 via INTRAVENOUS

## 2015-09-06 MED ORDER — SUGAMMADEX SODIUM 500 MG/5ML IV SOLN
INTRAVENOUS | Status: DC | PRN
  Administered 2015-09-06: 246 mg via INTRAVENOUS

## 2015-09-06 MED ORDER — ONDANSETRON HCL 4 MG/2ML IJ SOLN
4.0000 mg | INTRAMUSCULAR | Status: DC | PRN
Start: 2015-09-06 — End: 2015-09-08
  Administered 2015-09-06 – 2015-09-07 (×4): 4 mg via INTRAVENOUS
  Filled 2015-09-06 (×3): qty 2

## 2015-09-06 MED ORDER — FENTANYL CITRATE (PF) 100 MCG/2ML IJ SOLN
INTRAMUSCULAR | Status: DC | PRN
  Administered 2015-09-06 (×2): 50 ug via INTRAVENOUS
  Administered 2015-09-06 (×2): 25 ug via INTRAVENOUS
  Administered 2015-09-06 (×2): 50 ug via INTRAVENOUS

## 2015-09-06 MED ORDER — ROCURONIUM BROMIDE 100 MG/10ML IV SOLN
INTRAVENOUS | Status: DC | PRN
  Administered 2015-09-06 (×2): 10 mg via INTRAVENOUS
  Administered 2015-09-06: 40 mg via INTRAVENOUS

## 2015-09-06 MED ORDER — PROPOFOL 10 MG/ML IV BOLUS
INTRAVENOUS | Status: DC | PRN
  Administered 2015-09-06: 220 mg via INTRAVENOUS

## 2015-09-06 MED ORDER — ATROPINE SULFATE 0.4 MG/ML IJ SOLN
INTRAMUSCULAR | Status: DC | PRN
  Administered 2015-09-06: .8 mg via INTRAVENOUS

## 2015-09-06 MED ORDER — ROCURONIUM BROMIDE 100 MG/10ML IV SOLN
INTRAVENOUS | Status: AC
  Filled 2015-09-06: qty 1

## 2015-09-06 MED ORDER — FENTANYL CITRATE (PF) 250 MCG/5ML IJ SOLN
INTRAMUSCULAR | Status: AC
  Filled 2015-09-06: qty 5

## 2015-09-06 MED ORDER — SODIUM CHLORIDE 0.9 % IJ SOLN: 10.0000 mL | Freq: Two times a day (BID) | INTRAMUSCULAR | Status: DC

## 2015-09-06 MED ORDER — PREMIER PROTEIN SHAKE
2.0000 [oz_av] | Freq: Four times a day (QID) | ORAL | Status: DC
  Administered 2015-09-08: 2 [oz_av] via ORAL

## 2015-09-06 MED ORDER — LACTATED RINGERS IV SOLN
INTRAVENOUS | Status: DC | PRN
  Administered 2015-09-06: 08:00:00 via INTRAVENOUS

## 2015-09-06 MED ORDER — DEXAMETHASONE SODIUM PHOSPHATE 10 MG/ML IJ SOLN
INTRAMUSCULAR | Status: AC
  Filled 2015-09-06: qty 1

## 2015-09-06 MED ORDER — SODIUM CHLORIDE 0.9 % IJ SOLN
10.0000 mL | INTRAMUSCULAR | Status: DC | PRN
  Administered 2015-09-07: 10 mL
  Filled 2015-09-06: qty 40

## 2015-09-06 MED ORDER — PANTOPRAZOLE SODIUM 40 MG IV SOLR
40.0000 mg | Freq: Every day | INTRAVENOUS | Status: DC
  Administered 2015-09-06 – 2015-09-07 (×2): 40 mg via INTRAVENOUS
  Filled 2015-09-06 (×3): qty 40

## 2015-09-06 MED ORDER — DEXTROSE 5 % IV SOLN
2.0000 g | INTRAVENOUS | Status: AC
  Administered 2015-09-06 (×2): 2 g via INTRAVENOUS

## 2015-09-06 MED ORDER — 0.9 % SODIUM CHLORIDE (POUR BTL) OPTIME
TOPICAL | Status: DC | PRN
  Administered 2015-09-06: 1000 mL

## 2015-09-06 MED ORDER — OXYCODONE HCL 5 MG/5ML PO SOLN: 5.0000 mg | ORAL | Status: DC | PRN

## 2015-09-06 MED ORDER — BUPIVACAINE LIPOSOME 1.3 % IJ SUSP
20.0000 mL | Freq: Once | INTRAMUSCULAR | Status: AC
  Administered 2015-09-06: 20 mL
  Filled 2015-09-06: qty 20

## 2015-09-06 MED ORDER — FENTANYL CITRATE (PF) 100 MCG/2ML IJ SOLN
12.5000 ug | INTRAMUSCULAR | Status: DC | PRN
  Administered 2015-09-06 – 2015-09-07 (×7): 12.5 ug via INTRAVENOUS
  Filled 2015-09-06 (×6): qty 2

## 2015-09-06 MED ORDER — HYDROMORPHONE HCL 1 MG/ML IJ SOLN
0.2500 mg | INTRAMUSCULAR | Status: DC | PRN
  Administered 2015-09-06 (×4): 0.5 mg via INTRAVENOUS

## 2015-09-06 MED ORDER — KCL IN DEXTROSE-NACL 20-5-0.45 MEQ/L-%-% IV SOLN
INTRAVENOUS | Status: DC
  Administered 2015-09-06 – 2015-09-07 (×3): via INTRAVENOUS
  Administered 2015-09-07 – 2015-09-08 (×2): 100 mL/h via INTRAVENOUS
  Filled 2015-09-06 (×6): qty 1000

## 2015-09-06 SURGICAL SUPPLY — 64 items
APL SRG 32X5 SNPLK LF DISP (MISCELLANEOUS)
APPLICATOR COTTON TIP 6IN STRL (MISCELLANEOUS) IMPLANT
APPLIER CLIP ROT 10 11.4 M/L (STAPLE)
APPLIER CLIP ROT 13.4 12 LRG (CLIP)
APR CLP LRG 13.4X12 ROT 20 MLT (CLIP)
APR CLP MED LRG 11.4X10 (STAPLE)
BLADE HEX COATED 2.75 (ELECTRODE) IMPLANT
BLADE SURG 15 STRL LF DISP TIS (BLADE) ×2 IMPLANT
BLADE SURG 15 STRL SS (BLADE) ×3
CABLE HIGH FREQUENCY MONO STRZ (ELECTRODE) ×1 IMPLANT
CLIP APPLIE ROT 10 11.4 M/L (STAPLE) IMPLANT
CLIP APPLIE ROT 13.4 12 LRG (CLIP) IMPLANT
COVER SURGICAL LIGHT HANDLE (MISCELLANEOUS) ×3 IMPLANT
DEVICE SUT QUICK LOAD TK 5 (STAPLE) ×1 IMPLANT
DEVICE SUT TI-KNOT TK 5X26 (MISCELLANEOUS) ×1 IMPLANT
DEVICE SUTURE ENDOST 10MM (ENDOMECHANICALS) ×1 IMPLANT
DEVICE TROCAR PUNCTURE CLOSURE (ENDOMECHANICALS) ×3 IMPLANT
DISSECTOR BLUNT TIP ENDO 5MM (MISCELLANEOUS) ×3 IMPLANT
DRAPE CAMERA CLOSED 9X96 (DRAPES) ×3 IMPLANT
ELECT REM PT RETURN 9FT ADLT (ELECTROSURGICAL) ×3
ELECTRODE REM PT RTRN 9FT ADLT (ELECTROSURGICAL) ×2 IMPLANT
GAUZE SPONGE 4X4 12PLY STRL (GAUZE/BANDAGES/DRESSINGS) IMPLANT
GLOVE BIOGEL M 8.0 STRL (GLOVE) ×4 IMPLANT
GOWN STRL REUS W/TWL XL LVL3 (GOWN DISPOSABLE) ×10 IMPLANT
HANDLE STAPLE EGIA 4 XL (STAPLE) ×3 IMPLANT
HOVERMATT SINGLE USE (MISCELLANEOUS) ×3 IMPLANT
KIT BASIN OR (CUSTOM PROCEDURE TRAY) ×3 IMPLANT
LIQUID BAND (GAUZE/BANDAGES/DRESSINGS) ×1 IMPLANT
NDL SPNL 22GX3.5 QUINCKE BK (NEEDLE) ×2 IMPLANT
NEEDLE SPNL 22GX3.5 QUINCKE BK (NEEDLE) ×3 IMPLANT
PACK UNIVERSAL I (CUSTOM PROCEDURE TRAY) ×3 IMPLANT
PEN SKIN MARKING BROAD (MISCELLANEOUS) ×3 IMPLANT
RELOAD ENDO STITCH (ENDOMECHANICALS) ×3 IMPLANT
RELOAD STAPLE 45 PURP MED/THCK (STAPLE) IMPLANT
RELOAD SUT TRIPLE-STITCH 2-0 (ENDOMECHANICALS) IMPLANT
RELOAD TRI 45 ART MED THCK BLK (STAPLE) ×3 IMPLANT
RELOAD TRI 45 ART MED THCK PUR (STAPLE) ×6 IMPLANT
RELOAD TRI 60 ART MED THCK BLK (STAPLE) ×5 IMPLANT
RELOAD TRI 60 ART MED THCK PUR (STAPLE) ×4 IMPLANT
SCISSORS LAP 5X45 EPIX DISP (ENDOMECHANICALS) ×1 IMPLANT
SCRUB PCMX 4 OZ (MISCELLANEOUS) ×6 IMPLANT
SEALANT SURGICAL APPL DUAL CAN (MISCELLANEOUS) IMPLANT
SET IRRIG TUBING LAPAROSCOPIC (IRRIGATION / IRRIGATOR) ×3 IMPLANT
SHEARS HARMONIC ACE PLUS 45CM (MISCELLANEOUS) ×3 IMPLANT
SLEEVE ADV FIXATION 5X100MM (TROCAR) ×6 IMPLANT
SLEEVE GASTRECTOMY 36FR VISIGI (MISCELLANEOUS) ×3 IMPLANT
SOLUTION ANTI FOG 6CC (MISCELLANEOUS) ×3 IMPLANT
SPONGE LAP 18X18 X RAY DECT (DISPOSABLE) ×3 IMPLANT
STAPLER VISISTAT 35W (STAPLE) ×3 IMPLANT
SUT VIC AB 4-0 SH 18 (SUTURE) ×3 IMPLANT
SYR 20CC LL (SYRINGE) ×3 IMPLANT
SYR 50ML LL SCALE MARK (SYRINGE) ×3 IMPLANT
TOWEL OR 17X26 10 PK STRL BLUE (TOWEL DISPOSABLE) ×6 IMPLANT
TOWEL OR NON WOVEN STRL DISP B (DISPOSABLE) ×3 IMPLANT
TRAY FOLEY W/METER SILVER 14FR (SET/KITS/TRAYS/PACK) ×2 IMPLANT
TRAY FOLEY W/METER SILVER 16FR (SET/KITS/TRAYS/PACK) ×2 IMPLANT
TROCAR ADV FIXATION 12X100MM (TROCAR) ×3 IMPLANT
TROCAR ADV FIXATION 5X100MM (TROCAR) ×3 IMPLANT
TROCAR BLADELESS 15MM (ENDOMECHANICALS) ×3 IMPLANT
TROCAR BLADELESS OPT 5 100 (ENDOMECHANICALS) ×3 IMPLANT
TUBE CALIBRATION LAPBAND (TUBING) ×1 IMPLANT
TUBING CONNECTING 10 (TUBING) ×3 IMPLANT
TUBING ENDO SMARTCAP (MISCELLANEOUS) ×3 IMPLANT
TUBING FILTER THERMOFLATOR (ELECTROSURGICAL) ×3 IMPLANT

## 2015-09-06 NOTE — Anesthesia Procedure Notes (Signed)
Procedure Name: Intubation Date/Time: 09/06/2015 7:30 AM Performed by: Carleene Cooper A Pre-anesthesia Checklist: Patient identified, Emergency Drugs available, Suction available, Patient being monitored and Timeout performed Patient Re-evaluated:Patient Re-evaluated prior to inductionOxygen Delivery Method: Circle system utilized Intubation Type: IV induction Ventilation: Mask ventilation without difficulty Laryngoscope Size: Mac and 4 Grade View: Grade I Tube type: Oral Tube size: 7.5 mm Number of attempts: 1 Airway Equipment and Method: Stylet Placement Confirmation: ETT inserted through vocal cords under direct vision,  positive ETCO2 and breath sounds checked- equal and bilateral Secured at: 21 cm Tube secured with: Tape Dental Injury: Teeth and Oropharynx as per pre-operative assessment

## 2015-09-06 NOTE — Op Note (Signed)
Surgeon: Kaylyn Lim, MD, FACS  Asst:  Greer Pickerel, MD, FACS  Anes:  General endotracheal  Procedure: Laparoscopic sleeve gastrectomy with posterior hiatal hernia repair (1 suture) and upper endoscopy by Dr. Redmond Pulling  Diagnosis: Morbid obesity  Complications: None noted  EBL:   8 cc  Description of Procedure:  The patient was take to OR 4 and given general anesthesia.  The abdomen was prepped with PCMX and draped sterilely.  A timeout was performed.  Access to the abdomen was achieved with a 5 mm Optiview through the left upper quadrant.  Standard trocar placement included a 15 to the right of the midline and the rest of the total 6 trocars were 5 mm.  Nathanson in upper midline.  Following insufflation, the state of the abdomen was found to be free of adhesions.  The gallbladder looked robin's egg blue.  With her history of GER, we checked her with the balloon calibration tube with 10 cc of air and this was positive.  A posterior dissection was performed revealing the right and left crura and they were approximated with a single suture of Surgidek secured with a TyKnot.    The ViSiGi 36Fr tube was inserted to deflate the stomach and was pulled back into the esophagus.    The pylorus was identified and we measured 5 cm back and marked the antrum.  At that point we began dissection to take down the greater curvature of the stomach using the Harmonic scalpel.  This dissection was taken all the way up to the left crus.  Posterior attachments of the stomach were also taken down.    The ViSiGi tube was then passed into the antrum and suction applied so that it was snug along the lessor curvature.  The "crow's foot" or incisura was identified.  The sleeve gastrectomy was begun using the [employer] stapler beginning with a 4.5 black load with TRS.  Two more black loads with TRS were applied followed by the remainder with purple and TRS.  When the sleeve was complete the tube was taken off  suction and insufflated briefly.  The tube was withdrawn.  Upper endoscopy was then performed by Dr. Redmond Pulling.     The specimen was extracted through the 15 trocar site with a bag technique and the fascial wound approximated with a single 0 vicryl.  Wounds were infiltrated with Exparel and closed with 4-0 Vicryl and Liquiban.    Matt B. Hassell Done, [place], [facility], [place]

## 2015-09-06 NOTE — Progress Notes (Signed)
Patient alert and oriented, op day.  Provided support and encouragement.  Encouraged pulmonary toilet and ambulation.  All questions answered.  Will continue to monitor.

## 2015-09-06 NOTE — Interval H&P Note (Signed)
History and Physical Interval Note:  09/06/2015 7:12 AM  [name] [name]  has presented today for surgery, with the diagnosis of Morbid Obesity  The various methods of treatment have been discussed with the patient and family. After consideration of risks, benefits and other options for treatment, the patient has consented to  Procedure(s): LAPAROSCOPIC GASTRIC SLEEVE RESECTION WITH HIATAL HERNIA REPAIR (N/A) as a surgical intervention .  The patient's history has been reviewed, patient examined, no change in status, stable for surgery.  I have reviewed the patient's chart and labs.  Questions were answered to the patient's satisfaction.     Littie Chiem B

## 2015-09-06 NOTE — Op Note (Signed)
ALLANTE SLIWA CI:[id number] 07-Jun-1961 09/06/2015  Preoperative diagnosis: morbid obesity  Postoperative diagnosis: Same   Procedure: upper endoscopy   Surgeon: Leighton Ruff. Angeliki Mates M.D., FACS   Anesthesia: Gen.   Indications for procedure: 54 year old female undergoing Laparoscopic Gastric Sleeve Resection and an EGD was requested to evaluate the new gastric sleeve.   Description of procedure: After we have completed the sleeve resection, I scrubbed out and obtained the Olympus endoscope. I gently placed endoscope in the patient's oropharynx and gently glided it down the esophagus without any difficulty under direct visualization. Once I was in the gastric sleeve, I insufflated the stomach with air. I was able to cannulate and advanced the scope through the gastric sleeve. I was able to cannulate the duodenum with ease. Dr. Hassell Done had placed saline in the upper abdomen. Upon further insufflation of the gastric sleeve there was no evidence of bubbles. GE junction located at 35 cm.  Upon further inspection of the gastric sleeve, the mucosa appeared normal. There is no evidence of any mucosal abnormality. The sleeve was widely patent at the angularis. There was no evidence of bleeding. The gastric sleeve was decompressed. The scope was withdrawn. The patient tolerated this portion of the procedure well. Please see Dr Earlie Server operative note for details regarding the laparoscopic gastric sleeve resection.   Leighton Ruff. Redmond Pulling, MD, FACS  General, Bariatric, & Minimally Invasive Surgery  [facility], [place]

## 2015-09-06 NOTE — Progress Notes (Signed)
Utilization review completed.  L. J. Shalamar Plourde RN, BSN, CM 

## 2015-09-06 NOTE — Anesthesia Postprocedure Evaluation (Signed)
Anesthesia Post Note  Patient: [name] [name]  Procedure(s) Performed: Procedure(s) (LRB): LAPAROSCOPIC GASTRIC SLEEVE RESECTION WITH HIATAL HERNIA REPAIR (N/A) UPPER GI ENDOSCOPY  Patient location during evaluation: PACU Anesthesia Type: General Level of consciousness: awake and alert Pain management: pain level controlled Vital Signs Assessment: post-procedure vital signs reviewed and stable Respiratory status: spontaneous breathing, nonlabored ventilation, respiratory function stable and patient connected to nasal cannula oxygen Cardiovascular status: blood pressure returned to baseline and stable Postop Assessment: no signs of nausea or vomiting Anesthetic complications: no    Last Vitals:  Filed Vitals:   09/06/15 1100 09/06/15 1110  BP: 141/88   Pulse: 68 69  Temp:    Resp: 12 11    Last Pain:  Filed Vitals:   09/06/15 1111  PainSc: 4                  Bettyjane Shenoy J

## 2015-09-06 NOTE — H&P (Signed)
[name] [name] DOB: Feb 11, 1961 Married / Language: English / Race: White Female  History of Present Illness  Patient words: desire to lose weight  The patient is a 54 year old female who presents for a bariatric surgery evaluation. She comes in today to discuss sleeve gastectomy. Carlyon Shadow has watched all of our bariatric patients in the holding area and is somewhat familiar with the procedures. She has been to our seminars and has started this process twice in the last ten years. Her craniotomies have lead to her delays but now she has the go ahead from her neurosurgeon at [facility]. She is interested in having a sleeve gastrectomy. She weighed 150 lbs when she married in 1985. 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 She has a lot of insight in to this. She continues to work very hard in her job in the holding area at [employer].  She has had prior C sections and an open laparotomy by Dr. Ebbie Sorenson Saras and she may have a hernia in her left lower quadrant. I discussed the procedure to her in detail. Will move forward with process to gain approval for a sleeve gastrectomy. She does have GERD. UGI showed a small hiatal hernia and an ultrasound did not show any gallstones.     Other Problems  Back Pain Bladder Problems Cancer Gastroesophageal Reflux Disease High blood pressure Migraine Headache Oophorectomy  Past Surgical History  Cesarean Section - Multiple Hysterectomy (not due to cancer) - Complete Spinal Surgery - Lower Back  Diagnostic Studies History  Colonoscopy 1-5 years ago Mammogram within last year Pap Smear 1-5 years ago Craniotomies for GBM.   Allergies  Morphine Sulfate ER *ANALGESICS - OPIOID* Sudafed PE Sinus/Allergy *COUGH/COLD/ALLERGY* Dye FDC Red 40 (Carmine Red) *PHARMACEUTICAL ADJUVANTS* Hives. Latex  Medication  History Estradiol (2MG  Tablet, Oral) Active. Protonix (40MG  Packet, Oral) Active. Metoprolol Succinate ER (25MG  Tablet ER 24HR, Oral) Active. CloNIDine HCl (0.1MG  Tablet, Oral two times daily) Active. Flexeril (10MG  Tablet, Oral) Active. Mobic (7.5MG  Tablet, Oral) Active. Vitamin D2 (400UNIT Tablet, Oral) Active. Magnesium Oxide (400MG  Tablet, Oral two times daily) Active. Vicodin (5-300MG  Tablet, Oral as needed) Active. Medications Reconciled  Social History  Caffeine use Carbonated beverages, Tea. No alcohol use No drug use Tobacco use Never smoker.  Family History  Bleeding disorder Sister. Breast Cancer Sister. Cancer Family Members In General. Diabetes Mellitus Brother, Father, Sister. Heart Disease Father, Mother. Hypertension Brother, Father, Mother, Sister. Kidney Disease Brother, Mother. Migraine Headache Daughter, Son. Thyroid problems Brother, Sister.  Pregnancy / Birth History  Age at menarche 24 years. Age of menopause <45 Gravida 2 Maternal age 53-25 Para 2    Review of Systems  General Present- Fatigue and Weight Gain. Not Present- Appetite Loss, Chills, Fever, Night Sweats and Weight Loss. Skin Present- Dryness. Not Present- Change in Wart/Mole, Hives, Jaundice, New Lesions, Non-Healing Wounds, Rash and Ulcer. HEENT Present- Seasonal Allergies and Wears glasses/contact lenses. Not Present- Earache, Hearing Loss, Hoarseness, Nose Bleed, Oral Ulcers, Ringing in the Ears, Sinus Pain, Sore Throat, Visual Disturbances and Yellow Eyes. Musculoskeletal Present- Back Pain and Joint Pain. Not Present- Joint Stiffness, Muscle Pain, Muscle Weakness and Swelling of Extremities. Neurological Present- Headaches. Not Present- Decreased Memory, Fainting, Numbness, Seizures, Tingling, Tremor, Trouble walking and Weakness. Endocrine Present- Heat Intolerance and Hot flashes. Not Present- Cold Intolerance, Excessive Hunger, Hair Changes and New  Diabetes.  Vitals [name] [name] CMA;  04/14/2015 9:46 AM Weight: 288 lb Height: 63in Body Surface Area: 2.41 m Body Mass Index: 51.02 kg/m      Physical Exam  General Note: HEENT prior craniotomy for GBM Neck supple without bruits Chest clear Heart SR without murmurs Abdomen obese may have LLQ hernia Ext no history of DVT, FROM Neuro no obvious neural defects from her prior craniotomy. exam grossly intact     Assessment & Plan  MORBID OBESITY (278.01  E66.01)-Plan sleeve gastrectomy.  She has the go ahead from her neurosurgeon at [facility] to pursue bariatric surgery.    Matt B. Hassell Done, MD, FACS

## 2015-09-06 NOTE — Transfer of Care (Signed)
Immediate Anesthesia Transfer of Care Note  Patient: [name] [name]  Procedure(s) Performed: Procedure(s): LAPAROSCOPIC GASTRIC SLEEVE RESECTION WITH HIATAL HERNIA REPAIR (N/A) UPPER GI ENDOSCOPY  Patient Location: PACU  Anesthesia Type:General  Level of Consciousness: awake, alert , oriented and patient cooperative  Airway & Oxygen Therapy: Patient Spontanous Breathing and Patient connected to face mask oxygen  Post-op Assessment: Report given to RN, Post -op Vital signs reviewed and stable and Patient moving all extremities  Post vital signs: Reviewed and stable  Last Vitals:  Filed Vitals:   09/06/15 0524  BP: 114/78  Pulse: 71  Temp: 36.3 C  Resp: 78    Complications: No apparent anesthesia complications

## 2015-09-07 ENCOUNTER — Ambulatory Visit (HOSPITAL_COMMUNITY): Payer: 59

## 2015-09-07 DIAGNOSIS — Z9889 Other specified postprocedural states: Secondary | ICD-10-CM

## 2015-09-07 LAB — CBC WITH DIFFERENTIAL/PLATELET
Basophils Absolute: 0 10*3/uL (ref 0.0–0.1)
Basophils Relative: 0 %
EOS ABS: 0 10*3/uL (ref 0.0–0.7)
Eosinophils Relative: 0 %
HEMATOCRIT: 39.5 % (ref 36.0–46.0)
HEMOGLOBIN: 13.1 g/dL (ref 12.0–15.0)
LYMPHS ABS: 1.8 10*3/uL (ref 0.7–4.0)
LYMPHS PCT: 20 %
MCH: 29.2 pg (ref 26.0–34.0)
MCHC: 33.2 g/dL (ref 30.0–36.0)
MCV: 88.2 fL (ref 78.0–100.0)
MONOS PCT: 8 %
Monocytes Absolute: 0.7 10*3/uL (ref 0.1–1.0)
NEUTROS PCT: 72 %
Neutro Abs: 6.5 10*3/uL (ref 1.7–7.7)
Platelets: 244 10*3/uL (ref 150–400)
RBC: 4.48 MIL/uL (ref 3.87–5.11)
RDW: 13.1 % (ref 11.5–15.5)
WBC: 8.9 10*3/uL (ref 4.0–10.5)

## 2015-09-07 LAB — HEMOGLOBIN AND HEMATOCRIT, BLOOD
HCT: 41.4 % (ref 36.0–46.0)
HEMOGLOBIN: 13.4 g/dL (ref 12.0–15.0)

## 2015-09-07 MED ORDER — HYDROMORPHONE HCL 1 MG/ML IJ SOLN
0.5000 mg | INTRAMUSCULAR | Status: DC | PRN
  Administered 2015-09-07 – 2015-09-08 (×3): 1 mg via INTRAVENOUS
  Filled 2015-09-07 (×3): qty 1

## 2015-09-07 MED ORDER — PROMETHAZINE HCL 25 MG RE SUPP
25.0000 mg | Freq: Four times a day (QID) | RECTAL | Status: DC | PRN
  Administered 2015-09-07: 25 mg via RECTAL
  Filled 2015-09-07: qty 1

## 2015-09-07 NOTE — Progress Notes (Addendum)
Dr Hassell Done paged after patient continues to be nauseated and has had several episodes of emesis with IV Zofran and IV Phenergan.  Await response.  Orders received

## 2015-09-07 NOTE — Progress Notes (Signed)
VASCULAR LAB PRELIMINARY  PRELIMINARY  PRELIMINARY  PRELIMINARY  Bilateral lower extremity venous duplex  completed.    Preliminary report:  Bilateral:  No evidence of DVT, superficial thrombosis, or Baker's Cyst.    Candida Vetter, RVT 09/07/2015, 9:13 AM

## 2015-09-07 NOTE — Progress Notes (Signed)
Patient alert and oriented, Post op day 1.  Provided support and encouragement.  Encouraged pulmonary toilet, ambulation and small sips of liquids.  All questions answered.  Will continue to monitor. 

## 2015-09-07 NOTE — Plan of Care (Signed)
Problem: Food- and Nutrition-Related Knowledge Deficit (NB-1.1) Goal: Nutrition education Formal process to instruct or train a patient/client in a skill or to impart knowledge to help patients/clients voluntarily manage or modify food choices and eating behavior to maintain or improve health. Outcome: Completed/Met Date Met:  09/07/15 Nutrition Education Note  Received consult for diet education per DROP protocol.   Discussed 2 week post op diet with pt. Emphasized that liquids must be non carbonated, non caffeinated, and sugar free. Fluid goals discussed. Pt to follow up with outpatient bariatric RD for further diet progression after 2 weeks. Multivitamins and minerals also reviewed. Teach back method used, pt expressed understanding, expect good compliance.   Diet: First 2 Weeks  You will see the nutritionist about two (2) weeks after your surgery. The nutritionist will increase the types of foods you can eat if you are handling liquids well:  If you have severe vomiting or nausea and cannot handle clear liquids lasting longer than 1 day, call your surgeon  Protein Shake  Drink at least 2 ounces of shake 5-6 times per day  Each serving of protein shakes (usually 8 - 12 ounces) should have a minimum of:  15 grams of protein  And no more than 5 grams of carbohydrate  Goal for protein each day:  Men = 80 grams per day  Women = 60 grams per day  Protein powder may be added to fluids such as non-fat milk or Lactaid milk or Soy milk (limit to 35 grams added protein powder per serving)   Hydration  Slowly increase the amount of water and other clear liquids as tolerated (See Acceptable Fluids)  Slowly increase the amount of protein shake as tolerated  Sip fluids slowly and throughout the day  May use sugar substitutes in small amounts (no more than 6 - 8 packets per day; i.e. Splenda)   Fluid Goal  The first goal is to drink at least 8 ounces of protein shake/drink per day (or as directed  by the nutritionist); some examples of protein shakes are Syntrax Nectar, Adkins Advantage, EAS Edge HP, and Unjury. See handout from pre-op Bariatric Education Class:  Slowly increase the amount of protein shake you drink as tolerated  You may find it easier to slowly sip shakes throughout the day  It is important to get your proteins in first  Your fluid goal is to drink 64 - 100 ounces of fluid daily  It may take a few weeks to build up to this  32 oz (or more) should be clear liquids  And  32 oz (or more) should be full liquids (see below for examples)  Liquids should not contain sugar, caffeine, or carbonation   Clear Liquids:  Water or Sugar-free flavored water (i.e. Fruit H2O, Propel)  Decaffeinated coffee or tea (sugar-free)  Crystal Lite, Wyler's Lite, Minute Maid Lite  Sugar-free Jell-O  Bouillon or broth  Sugar-free Popsicle: *Less than 20 calories each; Limit 1 per day   Full Liquids:  Protein Shakes/Drinks + 2 choices per day of other full liquids  Full liquids must be:  No More Than 12 grams of Carbs per serving  No More Than 3 grams of Fat per serving  Strained low-fat cream soup  Non-Fat milk  Fat-free Lactaid Milk  Sugar-free yogurt (Dannon Lite & Fit, Greek yogurt)     [name] Decicco, MS, RD, LDN Pager: [phone] After Hours Pager: [phone]        

## 2015-09-08 LAB — CBC WITH DIFFERENTIAL/PLATELET
BASOS PCT: 0 %
Basophils Absolute: 0 10*3/uL (ref 0.0–0.1)
EOS ABS: 0.1 10*3/uL (ref 0.0–0.7)
Eosinophils Relative: 1 %
HCT: 41.8 % (ref 36.0–46.0)
HEMOGLOBIN: 13.5 g/dL (ref 12.0–15.0)
LYMPHS ABS: 1.7 10*3/uL (ref 0.7–4.0)
Lymphocytes Relative: 22 %
MCH: 28.7 pg (ref 26.0–34.0)
MCHC: 32.3 g/dL (ref 30.0–36.0)
MCV: 88.9 fL (ref 78.0–100.0)
MONO ABS: 0.7 10*3/uL (ref 0.1–1.0)
MONOS PCT: 8 %
Neutro Abs: 5.4 10*3/uL (ref 1.7–7.7)
Neutrophils Relative %: 69 %
Platelets: 243 10*3/uL (ref 150–400)
RBC: 4.7 MIL/uL (ref 3.87–5.11)
RDW: 13.1 % (ref 11.5–15.5)
WBC: 7.9 10*3/uL (ref 4.0–10.5)

## 2015-09-08 MED ORDER — HEPARIN SOD (PORK) LOCK FLUSH 100 UNIT/ML IV SOLN
500.0000 [IU] | INTRAVENOUS | Status: AC | PRN
  Administered 2015-09-08: 500 [IU]

## 2015-09-08 MED ORDER — PROMETHAZINE HCL 25 MG RE SUPP: 25.0000 mg | Freq: Four times a day (QID) | RECTAL | Status: DC | PRN

## 2015-09-08 NOTE — Discharge Instructions (Signed)

## 2015-09-08 NOTE — Discharge Summary (Signed)
Physician Discharge Summary  Patient ID: [name] [name] MRN: [id number] DOB/AGE: 04-08-1961 54 y.o.  Admit date: 09/06/2015 Discharge date: 09/08/2015  Admission Diagnoses:  Morbid obesity  Discharge Diagnoses:  same  Active Problems:   S/P laparoscopic sleeve gastrectomy Nov 2016   Surgery:  Laparoscopic sleeve gastrectomy  Discharged Condition: improved  Hospital Course:   Had surgery.  Nauseated on PD 1 which turned out to be Fentanyl.  She tolerated Dilaudid much better.  Taking protein shakes and ready for discharge on PD 2  Consults: none  Significant Diagnostic Studies: none    Discharge Exam: Blood pressure 151/56, pulse 84, temperature 98.5 F (36.9 C), temperature source Oral, resp. rate 18, height 5\' 3"  (1.6 m), weight 123.038 kg (271 lb 4 oz), SpO2 99 %. Incisions OK  Disposition: 01-Home or Self Care  Discharge Instructions    Ambulate hourly while awake    Complete by:  As directed      Call MD for:  difficulty breathing, headache or visual disturbances    Complete by:  As directed      Call MD for:  persistant dizziness or light-headedness    Complete by:  As directed      Call MD for:  persistant nausea and vomiting    Complete by:  As directed      Call MD for:  redness, tenderness, or signs of infection (pain, swelling, redness, odor or green/yellow discharge around incision site)    Complete by:  As directed      Call MD for:  severe uncontrolled pain    Complete by:  As directed      Call MD for:  temperature >101 F    Complete by:  As directed      Diet bariatric full liquid    Complete by:  As directed      Incentive spirometry    Complete by:  As directed   Perform hourly while awake            Medication List    TAKE these medications        Calcium-Vitamin D 600-200 MG-UNIT tablet  Take 1 tablet by mouth daily.     CHEWABLE MULTIPLE VITAMINS PO  Take 1 tablet by mouth daily.     cloNIDine 0.1 MG tablet  Commonly known as:   CATAPRES  Take 0.1 mg by mouth 2 (two) times daily.  Notes to Patient:  Monitor Blood Pressure Daily and keep a log for primary care physician.  You may need to make changes to your medications with rapid weight loss.        cyclobenzaprine 10 MG tablet  Commonly known as:  FLEXERIL  Take 10 mg by mouth 3 (three) times daily as needed for muscle spasms.     diazepam 5 MG tablet  Commonly known as:  VALIUM  Take 1 tablet (5 mg total) by mouth every 12 (twelve) hours as needed for anxiety.     estradiol 2 MG tablet  Commonly known as:  ESTRACE  Take 2 mg by mouth daily.     HYDROcodone-acetaminophen 5-325 MG tablet  Commonly known as:  NORCO/VICODIN  Take 1 tablet by mouth every 6 (six) hours as needed.     LORazepam 1 MG tablet  Commonly known as:  ATIVAN  Take 1 mg by mouth once as needed (for MRIs). For anxiety     magnesium oxide 400 MG tablet  Commonly known as:  MAG-OX  Take 400  mg by mouth 2 (two) times daily.     meloxicam 7.5 MG tablet  Commonly known as:  MOBIC  Take 7.5 mg by mouth daily.  Notes to Patient:  Avoid NSAIDs for 6-8 weeks after surgery      metoprolol tartrate 25 MG tablet  Commonly known as:  LOPRESSOR  TAKE 1 TABLET BY MOUTH TWICE DAILY     metroNIDAZOLE 0.75 % cream  Commonly known as:  METROCREAM  Apply 1 application topically 2 (two) times daily as needed (for rosacea with papular breakouts).     MIRVASO 0.33 % Gel  Generic drug:  Brimonidine Tartrate  Apply 1 application topically daily as needed (in addition to clonidine for bad rosacea flares).     ondansetron 4 MG disintegrating tablet  Commonly known as:  ZOFRAN-ODT  Take 1 tablet by mouth every 6 (six) hours as needed for nausea, vomiting or refractory nausea / vomiting.     oxyCODONE-acetaminophen 5-325 MG tablet  Commonly known as:  PERCOCET/ROXICET  Take 1 tablet by mouth daily as needed (migraine refractory to triptans and Vicodin).     pantoprazole 40 MG tablet  Commonly  known as:  PROTONIX  Take 1 tablet (40 mg total) by mouth daily. Take by mouth. Take 40 mg by mouth daily.     promethazine 25 MG tablet  Commonly known as:  PHENERGAN  Take 25 mg by mouth daily as needed (migraine).     promethazine 25 MG suppository  Commonly known as:  PHENERGAN  Place 1 suppository (25 mg total) rectally every 6 (six) hours as needed for nausea or vomiting.     rizatriptan 10 MG tablet  Commonly known as:  MAXALT  Take 1 tablet (10 mg total) by mouth 2 (two) times daily as needed for migraine. May repeat in 2 hours if needed     vitamin B-12 250 MCG tablet  Commonly known as:  CYANOCOBALAMIN  Take 250 mcg by mouth daily.           Follow-up Information    Follow up with Pedro Earls, MD. Go on 09/22/2015.   Specialty:  General Surgery   Why:  For Post-Op Check at 9:00   Contact information:   [place] [id number] [phone]       Signed: Pedro Earls 09/08/2015, 8:38 AM

## 2015-09-08 NOTE — Progress Notes (Signed)
Patient alert and oriented, pain is controlled. Patient is tolerating fluids,  advanced to protein shake today, patient tolerated well.  Reviewed Gastric sleeve discharge instructions with patient and patient is able to articulate understanding.  Provided information on BELT program, Support Group and [facility] outpatient pharmacy. All questions answered, will continue to monitor.  

## 2015-09-08 NOTE — Progress Notes (Signed)
Discharge instructions given to patient questions answere

## 2015-09-14 ENCOUNTER — Telehealth (HOSPITAL_COMMUNITY): Payer: Self-pay

## 2015-09-20 ENCOUNTER — Encounter: Payer: 59 | Attending: Surgery

## 2015-09-20 DIAGNOSIS — Z6841 Body Mass Index (BMI) 40.0 and over, adult: Secondary | ICD-10-CM | POA: Insufficient documentation

## 2015-09-20 DIAGNOSIS — Z713 Dietary counseling and surveillance: Secondary | ICD-10-CM | POA: Diagnosis not present

## 2015-09-20 NOTE — Progress Notes (Signed)
Bariatric Class:  Appt start time: 1530 end time:  1630.  2 Week Post-Operative Nutrition Class  Patient was seen on 09/20/15 for Post-Operative Nutrition education at [facility].   Surgery date: 09/06/15 Surgery type: sleeve gastrectomy Start weight at [facility]: 285 lbs on 06/08/15 Weight today: 260.0 lbs  Weight change: 24 lbs  TANITA  BODY COMP RESULTS  08/15/15 09/20/15   BMI (kg/m^2) 50.3 46.1   Fat Mass (lbs) 149.5 146.5   Fat Free Mass (lbs) 134.5 113.5   Total Body Water (lbs) 98.5 83.0    The following the learning objectives were met by the patient during this course:  Identifies Phase 3A (Soft, High Proteins) Dietary Goals and will begin from 2 weeks post-operatively to 2 months post-operatively  Identifies appropriate sources of fluids and proteins   States protein recommendations and appropriate sources post-operatively  Identifies the need for appropriate texture modifications, mastication, and bite sizes when consuming solids  Identifies appropriate multivitamin and calcium sources post-operatively  Describes the need for physical activity post-operatively and will follow MD recommendations  States when to call healthcare provider regarding medication questions or post-operative complications  Handouts given during class include:  Phase 3A: Soft, High Protein Diet Handout  Follow-Up Plan: Patient will follow-up at [facility] in 6 weeks for 2 month post-op nutrition visit for diet advancement per MD.

## 2015-10-18 ENCOUNTER — Other Ambulatory Visit: Payer: Self-pay | Admitting: Family Medicine

## 2015-10-18 MED FILL — PANTOPRAZOLE SOD DR 40 MG T: 40 | 90 days supply | Qty: 90 | Fill #0

## 2015-10-18 MED FILL — CYCLOBENZAPRINE 10 MG TAB: 10 | 20 days supply | Qty: 60 | Fill #2

## 2015-10-27 ENCOUNTER — Ambulatory Visit (INDEPENDENT_AMBULATORY_CARE_PROVIDER_SITE_OTHER): Payer: 59 | Admitting: Adult Health

## 2015-10-27 ENCOUNTER — Encounter: Payer: Self-pay | Admitting: Adult Health

## 2015-10-27 VITALS — BP 113/82 | HR 84 | Ht 63.0 in | Wt 251.0 lb

## 2015-10-27 DIAGNOSIS — C719 Malignant neoplasm of brain, unspecified: Secondary | ICD-10-CM

## 2015-10-27 DIAGNOSIS — G43009 Migraine without aura, not intractable, without status migrainosus: Secondary | ICD-10-CM

## 2015-10-27 NOTE — Progress Notes (Signed)
I have read the note, and I agree with the clinical assessment and plan.  Tkai Large KEITH   

## 2015-10-27 NOTE — Progress Notes (Signed)
PATIENT: [name] [name] DOB: [birth year]  REASON FOR VISIT: follow up- glioblastoma, migraines HISTORY FROM: patient  HISTORY OF PRESENT ILLNESS: [name] [name] is a 55 year old female with a history of glioblastoma and migraines. She returns today for follow-up visit. The patient states that she's had very few migraines. She states that in the last 6 months she is only had 1 migraine headache and she relates that to a upper respiratory infection. She reports that her most recent MRI of the brain did not show any growth for her glioblastoma. She has a repeat scan in April. Patient states that she is doing well. She continues to work full-time. She denies any new neurological symptoms. She states that she did have gastric sleeve surgery and has lost approximately 40 pounds. She returns today for an evaluation.  HISTORY 04/26/15: [name] [name] is a 55 year old female with a history of glioblastoma multiform and associated migraines. She returns today for follow-up. The patient states that her migraines have been controlled. She's not had a migraine since last month. She states that she can sometimes go several months without having a severe migraine. She states that when she does get a severe migraine is usually located behind one of the eyes. She has photophobia and phonophobia as well as nausea and vomiting. She states that Maxalt works intermittently. If she does not get any relief from Maxalt she will take Vicodin. The patient states that since November she has only had to [name] two days of work due to migraine headaches. She is currently not on any treatment for her glioblastoma. She has a follow-up scan in August. She denies any new neurological symptoms. Returns today for an evaluation.  HISTORY 10/26/14: [name] [name] is a 55 year old right-handed white female with a history of a glioblastoma multiforme, status post resection. The patient has had chemotherapy that was stopped in June 2015 secondary to  development of a peripheral neuropathy with lancinating pains and a Lhermitte's phenomenon. The patient has done well with her headaches. She is reporting some insomnia. She will be having another MRI of the brain in February 2016. She is having on average 2 headaches a month. She is taking Maxalt only for the headache, and doing well. Occasionally, she will take of Vicodin or a Phenergan if there is nausea. She is overall pleased with her headache frequency. The patient continues to work, she rarely misses work. She is able to operate a motor vehicle without difficulty. The patient reports no new numbness or weakness on the face, arms, or legs. There have been no seizure problems./  REVIEW OF SYSTEMS: Out of a complete 14 system review of symptoms, the patient complains only of the following symptoms, and all other reviewed systems are negative.  Memory loss, headache, numbness, back pain, aching muscles  ALLERGIES: Allergies  Allergen Reactions  . Shrimp [Shellfish Allergy] Nausea And Vomiting  . Morphine Other (See Comments)    Other reaction(s): Bladder Spasms (intolerance) bladder spasms Tolerates dilaudid, fentanyl, oxycodone, hydrocodone  . Latex Rash    Blistering of skin  . Pseudoephedrine Palpitations    Tolerates XR formulation  . Red Dye Hives  . Silver Rash    Tegaderm - if left on for > 24 hr    HOME MEDICATIONS: Outpatient Prescriptions Prior to Visit  Medication Sig Dispense Refill  . Brimonidine Tartrate (MIRVASO) 0.33 % GEL Apply 1 application topically daily as needed (in addition to clonidine for bad rosacea flares).     . cloNIDine (CATAPRES)  0.1 MG tablet Take 0.1 mg by mouth 2 (two) times daily.    . cyclobenzaprine (FLEXERIL) 10 MG tablet Take 10 mg by mouth 3 (three) times daily as needed for muscle spasms.    . diazepam (VALIUM) 5 MG tablet Take 1 tablet (5 mg total) by mouth every 12 (twelve) hours as needed for anxiety. (Patient taking differently: Take 5 mg  by mouth every 12 (twelve) hours as needed for anxiety (for anxiety associated with clonidine). ) 30 tablet 1  . estradiol (ESTRACE) 2 MG tablet Take 2 mg by mouth daily.    Marland Kitchen HYDROcodone-acetaminophen (NORCO/VICODIN) 5-325 MG per tablet Take 1 tablet by mouth every 6 (six) hours as needed.     Marland Kitchen LORazepam (ATIVAN) 1 MG tablet Take 1 mg by mouth once as needed (for MRIs). For anxiety    . magnesium oxide (MAG-OX) 400 MG tablet Take 400 mg by mouth 2 (two) times daily.     . meloxicam (MOBIC) 7.5 MG tablet Take 7.5 mg by mouth daily.    . metoprolol tartrate (LOPRESSOR) 25 MG tablet TAKE 1 TABLET BY MOUTH TWICE DAILY (Patient taking differently: Take 25 mg by mouth 2 (two) times daily. TAKE 1 TABLET BY MOUTH TWICE DAILY) 180 tablet 0  . metroNIDAZOLE (METROCREAM) 0.75 % cream Apply 1 application topically 2 (two) times daily as needed (for rosacea with papular breakouts).     . ondansetron (ZOFRAN-ODT) 4 MG disintegrating tablet Take 1 tablet by mouth every 6 (six) hours as needed for nausea, vomiting or refractory nausea / vomiting.   0  . oxyCODONE-acetaminophen (PERCOCET/ROXICET) 5-325 MG tablet Take 1 tablet by mouth daily as needed (migraine refractory to triptans and Vicodin).     . pantoprazole (PROTONIX) 40 MG tablet TAKE 1 TABLET BY MOUTH ONCE DAILY 90 tablet 0  . Pediatric Multiple Vitamins (CHEWABLE MULTIPLE VITAMINS PO) Take 1 tablet by mouth daily.    . promethazine (PHENERGAN) 25 MG suppository Place 1 suppository (25 mg total) rectally every 6 (six) hours as needed for nausea or vomiting. 12 each 0  . promethazine (PHENERGAN) 25 MG tablet Take 25 mg by mouth daily as needed (migraine).     . rizatriptan (MAXALT) 10 MG tablet Take 1 tablet (10 mg total) by mouth 2 (two) times daily as needed for migraine. May repeat in 2 hours if needed 10 tablet 5  . vitamin B-12 (CYANOCOBALAMIN) 250 MCG tablet Take 250 mcg by mouth daily.    . Calcium-Vitamin D 600-200 MG-UNIT tablet Take 1 tablet by  mouth daily.     No facility-administered medications prior to visit.    PAST MEDICAL HISTORY: Past Medical History  Diagnosis Date  . Migraine, unspecified, without mention of intractable migraine without mention of status migrainosus   . Overweight(278.02)   . PVC (premature ventricular contraction)   . Tachycardia, unspecified   . Viral meningitis   . Glioblastoma multiforme ([place]) 2007    Follow[facility] by Dr. Vedia Coffer at [facility]  . Depression     Migraines  . Brain cancer ([place]) 10/09/05    glioblastoma right temp/occipital  . DDD (degenerative disc disease), lumbar   . Hypertension     benign  . Microcytic anemia     iron def  2ndary chronic blood loss  . Anemia   . Obesity   . Complication of anesthesia   . Rash     occasional recurrent  . GERD (gastroesophageal reflux disease)   . Bladder spasms  PAST SURGICAL HISTORY: Past Surgical History  Procedure Laterality Date  . Abdominal hysterectomy    . Bilateral salpingoophorectomy    . Bladder surgery      tack  . Back surgery      lumbar  . Portacath placement    . Cystocele repair    . Cesarean section      x 2   . Gamma knife      tx of brain tumor  . Brain surgery  2014    resection of glioblastoma multiform  . Laparoscopic gastric sleeve resection with hiatal hernia repair N/A 09/06/2015    Procedure: LAPAROSCOPIC GASTRIC SLEEVE RESECTION WITH HIATAL HERNIA REPAIR;  Surgeon: Johnathan Hausen, MD;  Location: [facility];  Service: General;  Laterality: N/A;  . Upper gi endoscopy  09/06/2015    Procedure: UPPER GI ENDOSCOPY;  Surgeon: Johnathan Hausen, MD;  Location: [facility];  Service: General;;    FAMILY HISTORY: Family History  Problem Relation Age of Onset  . Cancer Other     tongue  . Heart disease Mother     bypass age 47  . Heart disease Father     heart failure, atrial fib  . Diabetes Father   . Liver disease Brother   . Diabetes Brother   . Colon cancer Neg Hx   . Diabetes Sister     SOCIAL  HISTORY: Social History   Social History  . Marital Status: Married    Spouse Name: Synetta Shadow   . Number of Children: 2  . Years of Education: college   Occupational History  .  [employer]    RN   Social History Main Topics  . Smoking status: Never Smoker   . Smokeless tobacco: Never Used  . Alcohol Use: No  . Drug Use: No  . Sexual Activity: Yes   Other Topics Concern  . Not on file   Social History Narrative   Patient is married and lives at home with her husband Synetta Shadow).   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   Right handed.   Caffeine one mountain dew diet.       PHYSICAL EXAM  Filed Vitals:   10/27/15 1517  BP: 113/82  Pulse: 84  Height: 5\' 3"  (1.6 m)  Weight: 251 lb (113.853 kg)   Body mass index is 44.47 kg/(m^2).  Generalized: Well developed, in no acute distress   Neurological examination  Mentation: Alert oriented to time, place, history taking. Follows all commands speech and language fluent Cranial nerve II-XII: Pupils were equal round reactive to light. Extraocular movements were full, visual field were full on confrontational test. Facial sensation and strength were normal. Uvula tongue midline. Head turning and shoulder shrug  were normal and symmetric. Motor: The motor testing reveals 5 over 5 strength of all 4 extremities. Good symmetric motor tone is noted throughout.  Sensory: Sensory testing is intact to soft touch on all 4 extremities. No evidence of extinction is noted.  Coordination: Cerebellar testing reveals good finger-nose-finger and heel-to-shin bilaterally.  Gait and station: Gait is normal. Tandem gait is normal. Romberg is negative. No drift is seen.  Reflexes: Deep tendon reflexes are symmetric and normal bilaterally.   DIAGNOSTIC DATA (LABS, IMAGING, TESTING) - I reviewed patient records, labs, notes, testing and imaging myself where available.  Lab Results  Component Value Date   WBC 7.9 09/08/2015   HGB  13.5 09/08/2015   HCT 41.8 09/08/2015   MCV 88.9  09/08/2015   PLT 243 09/08/2015      Component Value Date/Time   NA 139 08/25/2015 1030   NA 143 01/05/2014 0823   K 4.4 08/25/2015 1030   K 4.5 01/05/2014 0823   CL 104 08/25/2015 1030   CL 105 01/12/2013 0854   CO2 27 08/25/2015 1030   CO2 24 01/05/2014 0823   GLUCOSE 87 08/25/2015 1030   GLUCOSE 144* 01/05/2014 0823   GLUCOSE 91 01/12/2013 0854   BUN 18 08/25/2015 1030   BUN 9.6 01/05/2014 0823   CREATININE 0.77 09/06/2015 1519   CREATININE 0.9 01/05/2014 0823   CALCIUM 9.4 08/25/2015 1030   CALCIUM 10.0 01/05/2014 0823   PROT 7.6 08/25/2015 1030   PROT 6.9 01/05/2014 0823   ALBUMIN 4.2 08/25/2015 1030   ALBUMIN 3.1* 01/05/2014 0823   AST 42* 08/25/2015 1030   AST 32 01/05/2014 0823   ALT 38 08/25/2015 1030   ALT 25 01/05/2014 0823   ALKPHOS 98 08/25/2015 1030   ALKPHOS 108 01/05/2014 0823   BILITOT 0.8 08/25/2015 1030   BILITOT 0.35 01/05/2014 0823   GFRNONAA >60 09/06/2015 1519   GFRAA >60 09/06/2015 1519      ASSESSMENT AND PLAN 55 y.o. year old female  has a past medical history of Migraine, unspecified, without mention of intractable migraine without mention of status migrainosus; Overweight(278.02); PVC (premature ventricular contraction); Tachycardia, unspecified; Viral meningitis; Glioblastoma multiforme ([place]) (2007); Depression; Brain cancer ([place]) (10/09/05); DDD (degenerative disc disease), lumbar; Hypertension; Microcytic anemia; Anemia; Obesity; Complication of anesthesia; Rash; GERD (gastroesophageal reflux disease); and Bladder spasms. here with:  1. Migraines 2. Glioblastoma  Overall the patient is doing well. She is only had 1 migraine in the last 6 months. She will continue using Maxalt as needed. Patient advised that if her symptoms worsen or she develops new symptoms she should let me know. She will follow-up in 6 months or sooner if needed.     Ward Givens, MSN, NP-C 10/27/2015, 4:29  PM [facility] [address] [place], [place] [address] [phone]

## 2015-10-27 NOTE — Patient Instructions (Signed)
Glad you are doing so WELL!!!  If your headache frequency increases let us know.  Continue maxalt as needed.

## 2015-11-07 ENCOUNTER — Encounter: Payer: 59 | Attending: Surgery | Admitting: Dietician

## 2015-11-07 ENCOUNTER — Encounter: Payer: Self-pay | Admitting: Dietician

## 2015-11-07 DIAGNOSIS — Z6841 Body Mass Index (BMI) 40.0 and over, adult: Secondary | ICD-10-CM | POA: Diagnosis not present

## 2015-11-07 DIAGNOSIS — Z713 Dietary counseling and surveillance: Secondary | ICD-10-CM | POA: Insufficient documentation

## 2015-11-07 NOTE — Patient Instructions (Addendum)
Goals:  Follow Phase 3B: High Protein + Non-Starchy Vegetables  Eat 3-6 small meals/snacks, every 3-5 hrs  Increase lean protein foods to meet 60g goal  Increase fluid intake to 64oz +  Avoid drinking 15 minutes before, during and 30 minutes after eating  Aim for >30 min of physical activity daily  Keep practicing listening to your body, eating slowly, chewing thoroughly, and taking tiny bites  Try Celebrate MVI chew + a tablet for of iron   Surgery date: 09/06/15 Surgery type: sleeve gastrectomy Start weight at [facility]: 285 lbs on 06/08/15 (highest weight 297 lbs) Weight today: 242.5 lbs Weight change: 17.5 lbs Total weight lost: 54.5 lbs   TANITA  BODY COMP RESULTS  08/15/15 09/20/15 11/07/15   BMI (kg/m^2) 50.3 46.1 43   Fat Mass (lbs) 149.5 146.5 129.5   Fat Free Mass (lbs) 134.5 113.5 113   Total Body Water (lbs) 98.5 83.0 82.5

## 2015-11-07 NOTE — Progress Notes (Signed)
  Follow-up visit:  8 Weeks Post-Operative Sleeve gastrectomy Surgery  Medical Nutrition Therapy:  Appt start time: 210 end time:  245  Primary concerns today: Post-operative Bariatric Surgery Nutrition Management. [name] [name] returns today having lost another 17.5 lbs. She reports still struggling with fullness cues, having some vomiting. 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 Having trouble meeting fluid needs. Has reduced blood pressure on her own (informed doctor's office). Has noticed that taking the stairs is easier and clothes are much looser.   Samples provided and patient instructed on proper use: Celebrate multivitamin chew (orange - qty 2) Lot#: [id number] Exp: 12/2016  Surgery date: 09/06/15 Surgery type: sleeve gastrectomy Start weight at [facility]: 285 lbs on 06/08/15 (highest weight 297 lbs) Weight today: 242.5 lbs Weight change: 17.5 lbs Total weight lost: 54.5 lbs   TANITA  BODY COMP RESULTS  08/15/15 09/20/15 11/07/15   BMI (kg/m^2) 50.3 46.1 43   Fat Mass (lbs) 149.5 146.5 129.5   Fat Free Mass (lbs) 134.5 113.5 113   Total Body Water (lbs) 98.5 83.0 82.5    Preferred Learning Style:   No preference indicated   Learning Readiness:   Ready  24-hr recall: B (AM): Premier protein shake (30g) Snk (AM):   L (PM): lean cuisine or 2 oz meat + vegetable (14g) Snk (PM): jello or popsicle  D (PM): see list (14g) Snk (PM): sometimes sugar free jello or popsicle or Greek yogurt  Fluid intake: at least 32 oz Crystal Light, Nature's Twist, 11 oz protein shake  Estimated total protein intake: 58g  Medications: see list Supplementation: taking  Using straws: no Drinking while eating: sometimes, "depending on what I'm eating" Hair loss: maybe some thinning on the sides Carbonated beverages: has had 2 diet mountain dews N/V/D/C: vomiting if she eats too much/fast about every other day; constipation Dumping syndrome: none  Recent  physical activity:  Trying to walk more, parking farther away and taking the stairs  Progress Towards Goal(s):  In progress.  Handouts given during visit include:  Phase 3B lean protein + non starchy vegetables   Nutritional Diagnosis:  [place]-3.3 Overweight/obesity related to past poor dietary habits and physical inactivity as evidenced by patient w/ recent sleeve gatsrectomy surgery following dietary guidelines for continued weight loss.     Intervention:  Nutrition counseling provided.  Teaching Method Utilized:  Visual Auditory Hands on  Barriers to learning/adherence to lifestyle change: none  Demonstrated degree of understanding via:  Teach Back   Monitoring/Evaluation:  Dietary intake, exercise, and body weight. Follow up in 3 months for 5 month post-op visit.

## 2015-11-14 ENCOUNTER — Encounter (HOSPITAL_COMMUNITY)
Admission: RE | Admit: 2015-11-14 | Payer: 59 | Source: Ambulatory Visit | Attending: Medical Oncology | Admitting: Medical Oncology

## 2015-11-18 ENCOUNTER — Encounter (HOSPITAL_COMMUNITY): Payer: Self-pay

## 2015-11-18 ENCOUNTER — Encounter (HOSPITAL_COMMUNITY)
Admission: RE | Admit: 2015-11-18 | Discharge: 2015-11-18 | Disposition: A | Discharge status: Home / Self Care | Payer: 59 | Source: Ambulatory Visit | Attending: Medical Oncology | Admitting: Medical Oncology

## 2015-11-18 DIAGNOSIS — C719 Malignant neoplasm of brain, unspecified: Secondary | ICD-10-CM | POA: Diagnosis not present

## 2015-11-18 DIAGNOSIS — Z452 Encounter for adjustment and management of vascular access device: Secondary | ICD-10-CM | POA: Insufficient documentation

## 2015-11-18 MED ORDER — HEPARIN SOD (PORK) LOCK FLUSH 100 UNIT/ML IV SOLN
250.0000 [IU] | INTRAVENOUS | Status: DC | PRN
  Administered 2015-11-18: 250 [IU] via INTRAVENOUS
  Filled 2015-11-18: qty 5

## 2015-11-18 MED ORDER — SODIUM CHLORIDE 0.9% FLUSH
20.0000 mL | INTRAVENOUS | Status: DC | PRN
  Administered 2015-11-18: 20 mL
  Filled 2015-11-18: qty 20

## 2015-12-01 MED FILL — cloNIDine HCL 0.1 MG TABS: 0.1 | 30 days supply | Qty: 60 | Fill #5

## 2015-12-01 MED FILL — CYCLOBENZAPRINE 10 MG TAB: 10 | 20 days supply | Qty: 60 | Fill #3

## 2015-12-01 MED FILL — ESTRADIOL 2 MG TABLET: 2 | 90 days supply | Qty: 90 | Fill #1

## 2015-12-01 MED FILL — METOPROLOL TARTRATE 25 MG T: 25 | 90 days supply | Qty: 180 | Fill #1

## 2015-12-06 DIAGNOSIS — L719 Rosacea, unspecified: Secondary | ICD-10-CM | POA: Diagnosis not present

## 2016-01-13 ENCOUNTER — Encounter (HOSPITAL_COMMUNITY): Payer: Self-pay

## 2016-01-13 ENCOUNTER — Encounter (HOSPITAL_COMMUNITY)
Admission: RE | Admit: 2016-01-13 | Discharge: 2016-01-13 | Disposition: A | Discharge status: Home / Self Care | Payer: 59 | Source: Ambulatory Visit | Attending: Medical Oncology | Admitting: Medical Oncology

## 2016-01-13 DIAGNOSIS — C719 Malignant neoplasm of brain, unspecified: Secondary | ICD-10-CM | POA: Insufficient documentation

## 2016-01-13 DIAGNOSIS — Z452 Encounter for adjustment and management of vascular access device: Secondary | ICD-10-CM | POA: Diagnosis not present

## 2016-01-13 MED ORDER — SODIUM CHLORIDE 0.9% FLUSH
20.0000 mL | Freq: Once | INTRAVENOUS | Status: AC
  Administered 2016-01-13: 10 mL via INTRAVENOUS

## 2016-01-13 MED ORDER — HEPARIN SOD (PORK) LOCK FLUSH 100 UNIT/ML IV SOLN
250.0000 [IU] | Freq: Once | INTRAVENOUS | Status: AC
  Administered 2016-01-13: 250 [IU] via INTRAVENOUS
  Filled 2016-01-13: qty 5

## 2016-01-13 NOTE — Progress Notes (Signed)
Pt arrived for her every 8 week PAC flush with 250 units heparin flush ( per orders and protocol from her Dr Vedia Coffer at [facility] in [place]) [facility] accessed per protocol barely any blood tinge returned with vigorous flushing of saline x3. PAC deaccessed and reaccessed per protocol again flushing with saline x3 blood. At this time tinged blood  return was noted and was then flushed with Heparin per order. Pt states she has an appoint with Dr Maylon Peppers next week and will discuss the sluggish blood return with today's visit.

## 2016-01-16 DIAGNOSIS — R1110 Vomiting, unspecified: Secondary | ICD-10-CM | POA: Diagnosis not present

## 2016-01-16 DIAGNOSIS — D500 Iron deficiency anemia secondary to blood loss (chronic): Secondary | ICD-10-CM | POA: Diagnosis not present

## 2016-01-16 DIAGNOSIS — I10 Essential (primary) hypertension: Secondary | ICD-10-CM | POA: Diagnosis not present

## 2016-01-16 DIAGNOSIS — M519 Unspecified thoracic, thoracolumbar and lumbosacral intervertebral disc disorder: Secondary | ICD-10-CM | POA: Diagnosis not present

## 2016-01-16 DIAGNOSIS — C712 Malignant neoplasm of temporal lobe: Secondary | ICD-10-CM | POA: Diagnosis not present

## 2016-01-16 DIAGNOSIS — K219 Gastro-esophageal reflux disease without esophagitis: Secondary | ICD-10-CM | POA: Diagnosis not present

## 2016-01-30 MED FILL — PANTOPRAZOLE SOD DR 40 MG T: 40 | 90 days supply | Qty: 90 | Fill #1

## 2016-01-30 MED FILL — MELOXICAM 7.5 MG TABLET: 7.5 | 30 days supply | Qty: 30 | Fill #1

## 2016-01-30 MED FILL — CYCLOBENZAPRINE 10 MG TAB: 10 | 20 days supply | Qty: 60 | Fill #0

## 2016-01-30 MED FILL — metroNIDAZOLE 1 % GEL: 1 | 20 days supply | Qty: 55 | Fill #0

## 2016-02-01 MED FILL — HYDROCODON-APAP 5-325: 5-325 | 12 days supply | Qty: 50 | Fill #0

## 2016-02-01 MED FILL — LORazepam 1 MG TABS: 1 | 7 days supply | Qty: 30 | Fill #0

## 2016-02-02 ENCOUNTER — Encounter: Payer: Self-pay | Admitting: Dietician

## 2016-02-02 ENCOUNTER — Encounter: Payer: 59 | Attending: Surgery | Admitting: Dietician

## 2016-02-02 DIAGNOSIS — Z713 Dietary counseling and surveillance: Secondary | ICD-10-CM | POA: Insufficient documentation

## 2016-02-02 MED FILL — cloNIDine HCL 0.1 MG TABS: 0.1 | 30 days supply | Qty: 60 | Fill #0

## 2016-02-02 NOTE — Patient Instructions (Addendum)
Goals:  Follow Phase 3B: High Protein + Non-Starchy Vegetables  Eat 3-6 small meals/snacks, every 3-5 hrs  Increase lean protein foods to meet 60g goal  Increase fluid intake to 64oz +  Avoid drinking 15 minutes before, during and 30 minutes after eating  Aim for >30 min of physical activity daily  Keep practicing listening to your body, eating slowly, chewing thoroughly, and taking tiny bites  Include a protein food with fruit   Surgery date: 09/06/15 Surgery type: sleeve gastrectomy Start weight at [facility]: 285 lbs on 06/08/15 (highest weight 297 lbs) Weight today: 223 lbs Weight change: 20 lbs Total weight lost: 74 lbs   TANITA  BODY COMP RESULTS  08/15/15 09/20/15 11/07/15 02/02/16   BMI (kg/m^2) 50.3 46.1 43 39.6   Fat Mass (lbs) 149.5 146.5 129.5 97.5   Fat Free Mass (lbs) 134.5 113.5 113 126   Total Body Water (lbs) 98.5 83.0 82.5 92

## 2016-02-02 NOTE — Progress Notes (Signed)
  Follow-up visit: 5 months Post-Operative Sleeve gastrectomy Surgery  Medical Nutrition Therapy:  Appt start time: 205 end time:  240  Primary concerns today: Post-operative Bariatric Surgery Nutrition Management. Jeimmy returns today having lost another 20 lbs. She reports vomiting less often and doing better about eating more slowly. Drinking more fluids. Having more salad and tolerating. Does not tolerate soup as well.  Non scale victories: regular store clothing sizes, feeling better overall, one seat on a plane/helicopter, no seatbelt extender on planes   Surgery date: 09/06/15 Surgery type: sleeve gastrectomy Start weight at [facility]: 285 lbs on 06/08/15 (highest weight 297 lbs) Weight today: 223 lbs Weight change: 20 lbs Total weight lost: 74 lbs Weight loss goal: 150 lbs   TANITA  BODY COMP RESULTS  08/15/15 09/20/15 11/07/15 02/02/16   BMI (kg/m^2) 50.3 46.1 43 39.6   Fat Mass (lbs) 149.5 146.5 129.5 97.5   Fat Free Mass (lbs) 134.5 113.5 113 126   Total Body Water (lbs) 98.5 83.0 82.5 92    Preferred Learning Style:   No preference indicated   Learning Readiness:   Ready  24-hr recall: B (AM): Premier protein shake or bacon and egg (10-30g) Snk (AM):  Fruit or yogurt (0-15g) L (PM): 2 oz meat and vegetable or 1/2 [place] sandwich  (14g) Snk (PM):see morning snack (0-15g) D (PM): 2 oz meat and vegetable or salad (14g) Snk (PM): not usually  Fluid intake: about 60 oz Crystal Light, Nature's Twist, 11 oz protein shake  Estimated total protein intake: 53-73g  Medications: see list Supplementation: taking pill form  Using straws: no Drinking while eating: sometimes sips Hair loss: yes Carbonated beverages: has diet mountain dew "extremely rarely" N/V/D/C: some regurgitation; constipation resolving Dumping syndrome: none  Recent physical activity:  Trying to walk more, parking farther away and taking the stairs (moving more overall)  Progress Towards Goal(s):  In  progress.  Handouts given during visit include:  none   Nutritional Diagnosis:  [place]-3.3 Overweight/obesity related to past poor dietary habits and physical inactivity as evidenced by patient w/ recent sleeve gatsrectomy surgery following dietary guidelines for continued weight loss.     Intervention:  Nutrition counseling provided.  Teaching Method Utilized:  Visual Auditory Hands on  Barriers to learning/adherence to lifestyle change: none  Demonstrated degree of understanding via:  Teach Back   Monitoring/Evaluation:  Dietary intake, exercise, and body weight. Follow up in 3 months for 8 month post-op visit.

## 2016-02-29 MED FILL — ESTRADIOL 2 MG TABLET: 2 | 90 days supply | Qty: 90 | Fill #2

## 2016-03-09 ENCOUNTER — Encounter (HOSPITAL_COMMUNITY): Payer: 59

## 2016-03-14 ENCOUNTER — Encounter (HOSPITAL_COMMUNITY)
Admission: RE | Admit: 2016-03-14 | Discharge: 2016-03-14 | Disposition: A | Discharge status: Home / Self Care | Payer: 59 | Source: Ambulatory Visit | Attending: Medical Oncology | Admitting: Medical Oncology

## 2016-03-14 DIAGNOSIS — C719 Malignant neoplasm of brain, unspecified: Secondary | ICD-10-CM | POA: Insufficient documentation

## 2016-03-14 DIAGNOSIS — Z452 Encounter for adjustment and management of vascular access device: Secondary | ICD-10-CM | POA: Insufficient documentation

## 2016-03-28 ENCOUNTER — Encounter (HOSPITAL_COMMUNITY): Payer: Self-pay

## 2016-03-28 ENCOUNTER — Encounter (HOSPITAL_COMMUNITY)
Admission: RE | Admit: 2016-03-28 | Discharge: 2016-03-28 | Disposition: A | Discharge status: Home / Self Care | Payer: 59 | Source: Ambulatory Visit | Attending: Medical Oncology | Admitting: Medical Oncology

## 2016-03-28 DIAGNOSIS — Z452 Encounter for adjustment and management of vascular access device: Secondary | ICD-10-CM | POA: Diagnosis not present

## 2016-03-28 DIAGNOSIS — C719 Malignant neoplasm of brain, unspecified: Secondary | ICD-10-CM | POA: Diagnosis not present

## 2016-03-28 MED ORDER — SODIUM CHLORIDE 0.9% FLUSH
20.0000 mL | INTRAVENOUS | Status: DC | PRN
  Administered 2016-03-28: 20 mL via INTRAVENOUS
  Filled 2016-03-28: qty 20

## 2016-03-28 MED ORDER — HEPARIN SOD (PORK) LOCK FLUSH 100 UNIT/ML IV SOLN
500.0000 [IU] | INTRAVENOUS | Status: DC | PRN
  Filled 2016-03-28: qty 5

## 2016-03-28 MED ORDER — HEPARIN SOD (PORK) LOCK FLUSH 100 UNIT/ML IV SOLN
500.0000 [IU] | INTRAVENOUS | Status: DC | PRN
  Administered 2016-03-28: 500 [IU]

## 2016-04-13 MED FILL — METOPROLOL TARTRATE 25 MG T: 25 | 90 days supply | Qty: 180 | Fill #2

## 2016-04-13 MED FILL — CYCLOBENZAPRINE 10 MG TAB: 10 | 20 days supply | Qty: 60 | Fill #1

## 2016-04-13 MED FILL — MELOXICAM 7.5 MG TABLET: 7.5 | 30 days supply | Qty: 30 | Fill #2

## 2016-04-13 MED FILL — LORazepam 1 MG TABS: 1 | 7 days supply | Qty: 30 | Fill #1

## 2016-04-13 MED FILL — metroNIDAZOLE 1 % GEL: 1 | 20 days supply | Qty: 55 | Fill #1

## 2016-04-16 MED FILL — cloNIDine HCL 0.1 MG TABS: 0.1 | 30 days supply | Qty: 60 | Fill #0

## 2016-05-03 ENCOUNTER — Encounter: Payer: 59 | Attending: Surgery | Admitting: Dietician

## 2016-05-03 DIAGNOSIS — Z713 Dietary counseling and surveillance: Secondary | ICD-10-CM | POA: Insufficient documentation

## 2016-05-03 NOTE — Progress Notes (Signed)
  Follow-up visit: 8 months Post-Operative Sleeve gastrectomy Surgery  Medical Nutrition Therapy:  Appt start time: 205 end time:  235  Primary concerns today: Post-operative Bariatric Surgery Nutrition Management. Briar returns today having lost another 10.4 lbs. Feels like her rate of weight loss has plateaued but she is content and proud of her weight loss. Constipated currently because she has not been drinking enough fluid. Her husband and son have been keeping trigger foods in the house. "Feeling satisfied."  Non scale victories: regular store clothing sizes, feeling better overall, one seat on a plane/helicopter, no seatbelt extender on planes   Surgery date: 09/06/15 Surgery type: sleeve gastrectomy Start weight at [facility]: 285 lbs on 06/08/15 (highest weight 297 lbs) Weight today: 212.6 lbs Weight change: 10.4 lbs Total weight lost: 84.4 lbs   TANITA  BODY COMP RESULTS  08/15/15 09/20/15 11/07/15 02/02/16 05/03/16   BMI (kg/m^2) 50.3 46.1 43 39.6 37.7   Fat Mass (lbs) 149.5 146.5 129.5 97.5 89   Fat Free Mass (lbs) 134.5 113.5 113 126 123.6   Total Body Water (lbs) 98.5 83.0 82.5 92 88.8    Preferred Learning Style:   No preference indicated   Learning Readiness:   Ready  24-hr recall: B (AM): Eggs and toast or pack of crackers (7g) Snk (AM): fruit L (PM): 3 oz meat and vegetable (21g) Snk (PM): popcorn D (PM): 3 oz meat and vegetable or salad (21g) Snk (PM): not usually  Fluid intake: about 60 oz Crystal Light, Nature's Twist Estimated total protein intake: 53-73g  Medications: see list Supplementation: taking pill form  Using straws: no Drinking while eating: not usually; sometimes sips Hair loss: slowing down Carbonated beverages: has diet mountain dew "extremely rarely" N/V/D/C: some regurgitation if she eats too much/fast; irregular bowel movements and some constipation  Dumping syndrome: none  Recent physical activity:  Not walking as much due to  weather  Progress Towards Goal(s):  In progress.  Handouts given during visit include:  none   Nutritional Diagnosis:  [place]-3.3 Overweight/obesity related to past poor dietary habits and physical inactivity as evidenced by patient w/ recent sleeve gatsrectomy surgery following dietary guidelines for continued weight loss.     Intervention:  Nutrition counseling provided.  Teaching Method Utilized:  Visual Auditory Hands on  Barriers to learning/adherence to lifestyle change: none  Demonstrated degree of understanding via:  Teach Back   Monitoring/Evaluation:  Dietary intake, exercise, and body weight. Follow up in 4 months for 12 month post-op visit.

## 2016-05-03 NOTE — Patient Instructions (Addendum)
Goals:  Follow Phase 3B: High Protein + Non-Starchy Vegetables  Eat 3-6 small meals/snacks, every 3-5 hrs  Increase lean protein foods to meet 60g goal  Increase fluid intake to 64oz +  Avoid drinking 15 minutes before, during and 30 minutes after eating  Increase physical activity  Keep practicing listening to your body, eating slowly, chewing thoroughly, and taking tiny bites  Include a protein food with fruit or popcorn  Avoid starches like toast and crackers  Keep contributing to the grocery list and keep high protein snacks on hand (yogurt, cheese stick, P3 packs, [place] Balanced Break)  Surgery date: 09/06/15 Surgery type: sleeve gastrectomy Start weight at [facility]: 285 lbs on 06/08/15 (highest weight 297 lbs) Weight today: 212.6 lbs Weight change: 10.4 lbs Total weight lost: 84.4 lbs   TANITA  BODY COMP RESULTS  08/15/15 09/20/15 11/07/15 02/02/16 05/03/16   BMI (kg/m^2) 50.3 46.1 43 39.6 37.7   Fat Mass (lbs) 149.5 146.5 129.5 97.5 89   Fat Free Mass (lbs) 134.5 113.5 113 126 123.6   Total Body Water (lbs) 98.5 83.0 82.5 92 88.8

## 2016-05-07 ENCOUNTER — Encounter: Payer: Self-pay | Admitting: Dietician

## 2016-05-07 ENCOUNTER — Other Ambulatory Visit: Payer: Self-pay | Admitting: Obstetrics and Gynecology

## 2016-05-07 DIAGNOSIS — Z1231 Encounter for screening mammogram for malignant neoplasm of breast: Secondary | ICD-10-CM

## 2016-05-07 MED FILL — PANTOPRAZOLE SOD DR 40 MG T: 40 | 90 days supply | Qty: 90 | Fill #2

## 2016-05-10 ENCOUNTER — Encounter (HOSPITAL_COMMUNITY): Admission: RE | Admit: 2016-05-10 | Payer: 59 | Source: Ambulatory Visit

## 2016-05-14 DIAGNOSIS — M519 Unspecified thoracic, thoracolumbar and lumbosacral intervertebral disc disorder: Secondary | ICD-10-CM | POA: Diagnosis not present

## 2016-05-14 DIAGNOSIS — K219 Gastro-esophageal reflux disease without esophagitis: Secondary | ICD-10-CM | POA: Diagnosis not present

## 2016-05-14 DIAGNOSIS — C712 Malignant neoplasm of temporal lobe: Secondary | ICD-10-CM | POA: Diagnosis not present

## 2016-05-14 DIAGNOSIS — R1110 Vomiting, unspecified: Secondary | ICD-10-CM | POA: Diagnosis not present

## 2016-05-14 DIAGNOSIS — I10 Essential (primary) hypertension: Secondary | ICD-10-CM | POA: Diagnosis not present

## 2016-05-14 DIAGNOSIS — G43909 Migraine, unspecified, not intractable, without status migrainosus: Secondary | ICD-10-CM | POA: Diagnosis not present

## 2016-05-14 DIAGNOSIS — D500 Iron deficiency anemia secondary to blood loss (chronic): Secondary | ICD-10-CM | POA: Diagnosis not present

## 2016-05-15 ENCOUNTER — Encounter (HOSPITAL_COMMUNITY): Payer: Self-pay

## 2016-05-15 ENCOUNTER — Emergency Department (HOSPITAL_COMMUNITY)
Admission: EM | Admit: 2016-05-15 | Discharge: 2016-05-15 | Disposition: A | Discharge status: Home / Self Care | Payer: 59 | Attending: Emergency Medicine | Admitting: Emergency Medicine

## 2016-05-15 ENCOUNTER — Emergency Department (HOSPITAL_COMMUNITY): Payer: 59

## 2016-05-15 DIAGNOSIS — Z79899 Other long term (current) drug therapy: Secondary | ICD-10-CM | POA: Insufficient documentation

## 2016-05-15 DIAGNOSIS — Z85841 Personal history of malignant neoplasm of brain: Secondary | ICD-10-CM | POA: Diagnosis not present

## 2016-05-15 DIAGNOSIS — I10 Essential (primary) hypertension: Secondary | ICD-10-CM | POA: Insufficient documentation

## 2016-05-15 DIAGNOSIS — Y999 Unspecified external cause status: Secondary | ICD-10-CM | POA: Insufficient documentation

## 2016-05-15 DIAGNOSIS — Y9389 Activity, other specified: Secondary | ICD-10-CM | POA: Insufficient documentation

## 2016-05-15 DIAGNOSIS — M79631 Pain in right forearm: Secondary | ICD-10-CM | POA: Diagnosis not present

## 2016-05-15 DIAGNOSIS — S60811A Abrasion of right wrist, initial encounter: Secondary | ICD-10-CM | POA: Diagnosis not present

## 2016-05-15 DIAGNOSIS — S50811A Abrasion of right forearm, initial encounter: Secondary | ICD-10-CM | POA: Diagnosis not present

## 2016-05-15 DIAGNOSIS — Z91040 Latex allergy status: Secondary | ICD-10-CM | POA: Diagnosis not present

## 2016-05-15 DIAGNOSIS — R2231 Localized swelling, mass and lump, right upper limb: Secondary | ICD-10-CM | POA: Diagnosis present

## 2016-05-15 DIAGNOSIS — S3992XA Unspecified injury of lower back, initial encounter: Secondary | ICD-10-CM | POA: Diagnosis not present

## 2016-05-15 DIAGNOSIS — S20312A Abrasion of left front wall of thorax, initial encounter: Secondary | ICD-10-CM | POA: Diagnosis not present

## 2016-05-15 DIAGNOSIS — M545 Low back pain: Secondary | ICD-10-CM | POA: Diagnosis not present

## 2016-05-15 DIAGNOSIS — Y92410 Unspecified street and highway as the place of occurrence of the external cause: Secondary | ICD-10-CM | POA: Insufficient documentation

## 2016-05-15 DIAGNOSIS — S199XXA Unspecified injury of neck, initial encounter: Secondary | ICD-10-CM | POA: Diagnosis not present

## 2016-05-15 DIAGNOSIS — S59911A Unspecified injury of right forearm, initial encounter: Secondary | ICD-10-CM | POA: Diagnosis not present

## 2016-05-15 DIAGNOSIS — S299XXA Unspecified injury of thorax, initial encounter: Secondary | ICD-10-CM | POA: Diagnosis not present

## 2016-05-15 MED ORDER — HYDROCODONE-ACETAMINOPHEN 5-325 MG PO TABS: 1.0000 | ORAL_TABLET | Freq: Four times a day (QID) | ORAL | 0 refills | Status: DC | PRN
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
## 2016-05-15 NOTE — ED Notes (Signed)
Patient transported to X-ray 

## 2016-05-21 ENCOUNTER — Ambulatory Visit
Admission: RE | Admit: 2016-05-21 | Discharge: 2016-05-21 | Disposition: A | Discharge status: Home / Self Care | Payer: 59 | Source: Ambulatory Visit | Attending: Obstetrics and Gynecology | Admitting: Obstetrics and Gynecology

## 2016-05-21 DIAGNOSIS — Z1231 Encounter for screening mammogram for malignant neoplasm of breast: Secondary | ICD-10-CM

## 2016-05-25 ENCOUNTER — Encounter (HOSPITAL_COMMUNITY)
Admission: RE | Admit: 2016-05-25 | Discharge: 2016-05-25 | Disposition: A | Discharge status: Home / Self Care | Payer: 59 | Source: Ambulatory Visit | Attending: Medical Oncology | Admitting: Medical Oncology

## 2016-05-25 ENCOUNTER — Encounter (HOSPITAL_COMMUNITY): Payer: Self-pay

## 2016-05-25 DIAGNOSIS — Z452 Encounter for adjustment and management of vascular access device: Secondary | ICD-10-CM | POA: Diagnosis not present

## 2016-05-25 DIAGNOSIS — C719 Malignant neoplasm of brain, unspecified: Secondary | ICD-10-CM | POA: Insufficient documentation

## 2016-05-25 MED ORDER — SODIUM CHLORIDE 0.9% FLUSH
20.0000 mL | INTRAVENOUS | Status: DC | PRN
  Administered 2016-05-25: 20 mL via INTRAVENOUS
  Filled 2016-05-25: qty 20

## 2016-05-25 MED ORDER — HEPARIN SOD (PORK) LOCK FLUSH 100 UNIT/ML IV SOLN: 500.0000 [IU] | INTRAVENOUS | Status: DC | PRN

## 2016-05-25 MED ORDER — HEPARIN SOD (PORK) LOCK FLUSH 100 UNIT/ML IV SOLN
500.0000 [IU] | INTRAVENOUS | Status: DC | PRN
  Administered 2016-05-25: 500 [IU]
  Filled 2016-05-25: qty 5

## 2016-06-12 MED FILL — MELOXICAM 7.5 MG TABLET: 7.5 | 30 days supply | Qty: 30 | Fill #3

## 2016-06-12 MED FILL — cloNIDine HCL 0.1 MG TABS: 0.1 | 30 days supply | Qty: 60 | Fill #1

## 2016-06-12 MED FILL — HYDROCODON-APAP 5-325: 5-325 | 13 days supply | Qty: 50 | Fill #0

## 2016-06-12 MED FILL — ESTRADIOL 2 MG TABLET: 2 | 90 days supply | Qty: 90 | Fill #0

## 2016-06-14 DIAGNOSIS — H524 Presbyopia: Secondary | ICD-10-CM | POA: Diagnosis not present

## 2016-06-14 DIAGNOSIS — H5203 Hypermetropia, bilateral: Secondary | ICD-10-CM | POA: Diagnosis not present

## 2016-06-21 ENCOUNTER — Encounter (HOSPITAL_COMMUNITY): Payer: 59

## 2016-07-11 ENCOUNTER — Encounter (HOSPITAL_COMMUNITY): Payer: Self-pay

## 2016-07-11 DIAGNOSIS — Z6838 Body mass index (BMI) 38.0-38.9, adult: Secondary | ICD-10-CM | POA: Diagnosis not present

## 2016-07-11 DIAGNOSIS — Z01419 Encounter for gynecological examination (general) (routine) without abnormal findings: Secondary | ICD-10-CM | POA: Diagnosis not present

## 2016-07-20 ENCOUNTER — Encounter (HOSPITAL_COMMUNITY): Admission: RE | Admit: 2016-07-20 | Payer: 59 | Source: Ambulatory Visit

## 2016-07-20 ENCOUNTER — Other Ambulatory Visit (HOSPITAL_COMMUNITY): Payer: Self-pay | Admitting: Medical Oncology

## 2016-07-27 MED FILL — cloNIDine HCL 0.1 MG TABS: 0.1 | 30 days supply | Qty: 60 | Fill #2

## 2016-07-27 MED FILL — PANTOPRAZOLE SOD DR 40 MG T: 40 | 90 days supply | Qty: 90 | Fill #3

## 2016-07-27 MED FILL — CYCLOBENZAPRINE 10 MG TAB: 10 | 20 days supply | Qty: 60 | Fill #2

## 2016-08-02 ENCOUNTER — Encounter (HOSPITAL_COMMUNITY): Payer: 59

## 2016-08-17 MED FILL — MELOXICAM 7.5 MG TABLET: 7.5 | 30 days supply | Qty: 30 | Fill #0

## 2016-08-21 ENCOUNTER — Ambulatory Visit (HOSPITAL_COMMUNITY)
Admission: RE | Admit: 2016-08-21 | Discharge: 2016-08-21 | Disposition: A | Discharge status: Home / Self Care | Payer: 59 | Source: Ambulatory Visit | Attending: Medical Oncology | Admitting: Medical Oncology

## 2016-08-21 ENCOUNTER — Encounter (HOSPITAL_COMMUNITY): Payer: Self-pay

## 2016-08-21 DIAGNOSIS — C719 Malignant neoplasm of brain, unspecified: Secondary | ICD-10-CM | POA: Diagnosis not present

## 2016-08-21 MED ORDER — HEPARIN SOD (PORK) LOCK FLUSH 100 UNIT/ML IV SOLN
500.0000 [IU] | INTRAVENOUS | Status: DC | PRN
  Administered 2016-08-21: 500 [IU]
  Filled 2016-08-21: qty 5

## 2016-08-21 MED ORDER — SODIUM CHLORIDE 0.9% FLUSH
10.0000 mL | INTRAVENOUS | Status: DC
  Administered 2016-08-21: 10 mL via INTRAVENOUS

## 2016-09-06 ENCOUNTER — Encounter: Payer: 59 | Attending: Surgery | Admitting: Dietician

## 2016-09-06 DIAGNOSIS — Z713 Dietary counseling and surveillance: Secondary | ICD-10-CM | POA: Diagnosis not present

## 2016-09-06 NOTE — Progress Notes (Signed)
  Follow-up visit: 1 year Post-Operative Sleeve gastrectomy Surgery  Medical Nutrition Therapy:  Appt start time: 325 end time:  405  Primary concerns today: Post-operative Bariatric Surgery Nutrition Management. [name] [name] returns today having lost another 6.4 lbs. Struggling with constipation, especially when she drinks more protein shakes. Takes stool softeners as needed. Working on drinking more water. Proud that she was able to enjoy thanksgiving without getting sick. Still struggles to predict portion sizes. Working on not feeling guilty throwing away extra food.   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    Surgery date: 09/06/15 Surgery type: sleeve gastrectomy Start weight at [facility]: 285 lbs on 06/08/15 (highest weight 297 lbs) Weight today: 206.2 lbs Weight change: 6.4 lbs Total weight lost: 91 lbs   TANITA  BODY COMP RESULTS  08/15/15 09/20/15 11/07/15 02/02/16 05/03/16 09/06/16   BMI (kg/m^2) 50.3 46.1 43 39.6 37.7 36.5   Fat Mass (lbs) 149.5 146.5 129.5 97.5 89 81.4   Fat Free Mass (lbs) 134.5 113.5 113 126 123.6 124.8   Total Body Water (lbs) 98.5 83.0 82.5 92 88.8 89.4    Preferred Learning Style:   No preference indicated   Learning Readiness:   Ready  24-hr recall: B (AM): Eggs and toast or pack of crackers (7g) Snk (AM): fruit L (PM): 3 oz meat and vegetable (21g) Snk (PM): popcorn D (PM): 3 oz meat and vegetable or salad (21g) Snk (PM): not usually  Fluid intake: about 60 oz Crystal Light, Nature's Twist Estimated total protein intake: 53-73g  Medications: see list Supplementation: taking pill form  Using straws: no Drinking while eating: not usually; sometimes sips Hair loss: slowing down Carbonated beverages: has diet mountain dew "extremely rarely" N/V/D/C: irregular bowel movements, 1 every 2-3 days; sometimes regurgitation if she eats too  much Dumping syndrome: none  Recent physical activity:  "lacking," on her feet a lot at work  Progress Towards Goal(s):  In progress.  Handouts given during visit include:  none   Nutritional Diagnosis:  [place]-3.3 Overweight/obesity related to past poor dietary habits and physical inactivity as evidenced by patient w/ recent sleeve gatsrectomy surgery following dietary guidelines for continued weight loss.     Intervention:  Nutrition counseling provided.  Teaching Method Utilized:  Visual Auditory Hands on  Barriers to learning/adherence to lifestyle change: none  Demonstrated degree of understanding via:  Teach Back   Monitoring/Evaluation:  Dietary intake, exercise, and body weight. Follow up in 4 months for 15 month post-op visit.

## 2016-09-06 NOTE — Patient Instructions (Addendum)
Goals:  Follow Phase 3B: High Protein + Non-Starchy Vegetables  Eat 3-6 small meals/snacks, every 3-5 hrs  Increase lean protein foods to meet 60g goal  Increase fluid intake to 64oz +  Avoid drinking 15 minutes before, during and 30 minutes after eating  Increase physical activity  Keep practicing listening to your body, eating slowly, chewing thoroughly, and taking tiny bites  Include a protein food with fruit or popcorn  Avoid starches like toast and crackers  Keep contributing to the grocery list and keep high protein snacks on hand (yogurt, cheese stick, P3 packs, [place] Balanced Breaks)  Surgery date: 09/06/15 Surgery type: sleeve gastrectomy Start weight at [facility]: 285 lbs on 06/08/15 (highest weight 297 lbs) Weight today: 206.2 lbs Weight change: 6.4 lbs Total weight lost: 91 lbs   TANITA  BODY COMP RESULTS  08/15/15 09/20/15 11/07/15 02/02/16 05/03/16 09/06/16   BMI (kg/m^2) 50.3 46.1 43 39.6 37.7 36.5   Fat Mass (lbs) 149.5 146.5 129.5 97.5 89 81.4   Fat Free Mass (lbs) 134.5 113.5 113 126 123.6 124.8   Total Body Water (lbs) 98.5 83.0 82.5 92 88.8 89.4

## 2016-09-11 MED FILL — ESTRADIOL 1 MG TABLET: 1 | 30 days supply | Qty: 30 | Fill #0

## 2016-09-13 ENCOUNTER — Encounter (HOSPITAL_COMMUNITY): Payer: 59

## 2016-09-14 ENCOUNTER — Encounter (HOSPITAL_COMMUNITY): Payer: 59

## 2016-09-15 MED FILL — cloNIDine HCL 0.1 MG TABS: 0.1 | 30 days supply | Qty: 60 | Fill #3

## 2016-09-17 MED FILL — CYCLOBENZAPRINE 10 MG TAB: 10 | 20 days supply | Qty: 60 | Fill #3

## 2016-09-17 MED FILL — MELOXICAM 7.5 MG TABLET: 7.5 | 30 days supply | Qty: 30 | Fill #1

## 2016-10-16 ENCOUNTER — Ambulatory Visit (HOSPITAL_COMMUNITY)
Admission: RE | Admit: 2016-10-16 | Discharge: 2016-10-16 | Disposition: A | Discharge status: Home / Self Care | Payer: 59 | Source: Ambulatory Visit | Attending: Medical Oncology | Admitting: Medical Oncology

## 2016-10-16 DIAGNOSIS — C719 Malignant neoplasm of brain, unspecified: Secondary | ICD-10-CM | POA: Insufficient documentation

## 2016-10-16 MED ORDER — SODIUM CHLORIDE 0.9% FLUSH
20.0000 mL | INTRAVENOUS | Status: DC
  Administered 2016-10-16: 20 mL

## 2016-10-16 MED ORDER — HEPARIN SOD (PORK) LOCK FLUSH 100 UNIT/ML IV SOLN
500.0000 [IU] | INTRAVENOUS | Status: DC
  Administered 2016-10-16: 500 [IU] via INTRAVENOUS
  Filled 2016-10-16: qty 5

## 2016-10-22 MED FILL — metroNIDAZOLE 1 % GEL: 1 | 20 days supply | Qty: 55 | Fill #2

## 2016-10-22 MED FILL — ESTRADIOL 1 MG TABLET: 1 | 30 days supply | Qty: 30 | Fill #0

## 2016-10-22 MED FILL — MELOXICAM 7.5 MG TABLET: 7.5 | 30 days supply | Qty: 30 | Fill #2

## 2016-10-29 ENCOUNTER — Encounter: Payer: Self-pay | Admitting: Neurology

## 2016-10-29 ENCOUNTER — Ambulatory Visit (INDEPENDENT_AMBULATORY_CARE_PROVIDER_SITE_OTHER): Payer: 59 | Admitting: Neurology

## 2016-10-29 VITALS — BP 119/73 | HR 84 | Ht 63.5 in | Wt 202.0 lb

## 2016-10-29 DIAGNOSIS — G43709 Chronic migraine without aura, not intractable, without status migrainosus: Secondary | ICD-10-CM | POA: Diagnosis not present

## 2016-10-29 MED ORDER — OXYCODONE-ACETAMINOPHEN 5-325 MG PO TABS: 1.0000 | ORAL_TABLET | Freq: Every day | ORAL | 0 refills | Status: DC | PRN

## 2016-10-29 MED ORDER — HYDROCODONE-ACETAMINOPHEN 5-325 MG PO TABS: 1.0000 | ORAL_TABLET | Freq: Four times a day (QID) | ORAL | 0 refills | Status: DC | PRN

## 2016-10-29 MED ORDER — RIZATRIPTAN BENZOATE 10 MG PO TABS: 10.0000 mg | ORAL_TABLET | Freq: Two times a day (BID) | ORAL | 5 refills | Status: DC | PRN

## 2016-10-29 NOTE — Progress Notes (Signed)
Reason for visit: Migraine headache  [name] [name] is an 56 y.o. female  History of present illness:  [name] [name] is a 56 year old right-handed white female with a history of a glioblastoma and a history of migraine headache. The patient was diagnosed with her brain tumor 11 years ago, she has done relatively well with this. She recently has noted some issues with memory. She is followed through [facility] for the brain tumor, MRI of the brain has been planned for February 2018. The patient has noted some increase in her headaches over the last several months, she is now having 2 or 3 headaches a month. Maxalt usually works for the headache, but occasionally it does not help and she is to use hydrocodone or oxycodone if the hydrocodone does not work. She may take Phenergan for the headache. She has photophobia and phonophobia with the headache and nausea. She has noted that in the spring of the year, exposure to pollen will also increase her headache. Weather changes also worsen the headache. The patient returns to this office for an evaluation.  Past Medical History:  Diagnosis Date  . Anemia   . Bladder spasms   . Brain cancer ([place]) 10/09/05   glioblastoma right temp/occipital  . Complication of anesthesia   . DDD (degenerative disc disease), lumbar   . Depression    Migraines  . GERD (gastroesophageal reflux disease)   . Glioblastoma multiforme ([place]) 2007   Followed by Dr. Vedia Coffer at [facility]  . Hypertension    benign  . Microcytic anemia    iron def  2ndary chronic blood loss  . Migraine, unspecified, without mention of intractable migraine without mention of status migrainosus   . Obesity   . Overweight(278.02)   . PVC (premature ventricular contraction)   . Rash    occasional recurrent  . Tachycardia, unspecified   . Viral meningitis     Past Surgical History:  Procedure Laterality Date  . ABDOMINAL HYSTERECTOMY    . BACK SURGERY     lumbar  . BILATERAL  SALPINGOOPHORECTOMY    . BLADDER SURGERY     tack  . BRAIN SURGERY  2014   resection of glioblastoma multiform  . CESAREAN SECTION     x 2   . CYSTOCELE REPAIR    . gamma knife     tx of brain tumor  . LAPAROSCOPIC GASTRIC SLEEVE RESECTION WITH HIATAL HERNIA REPAIR N/A 09/06/2015   Procedure: LAPAROSCOPIC GASTRIC SLEEVE RESECTION WITH HIATAL HERNIA REPAIR;  Surgeon: Johnathan Hausen, MD;  Location: [facility];  Service: General;  Laterality: N/A;  . PORTACATH PLACEMENT    . UPPER GI ENDOSCOPY  09/06/2015   Procedure: UPPER GI ENDOSCOPY;  Surgeon: Johnathan Hausen, MD;  Location: [facility];  Service: General;;    Family History  Problem Relation Age of Onset  . Cancer Other     tongue  . Heart disease Mother     bypass age 7  . Heart disease Father     heart failure, atrial fib  . Diabetes Father   . Liver disease Brother   . Diabetes Brother   . Diabetes Sister   . Motor neuron disease Brother   . Colon cancer Neg Hx     Social history:  reports that she has never smoked. She has never used smokeless tobacco. She reports that she does not drink alcohol or use drugs.    Allergies  Allergen Reactions  . Shrimp [Shellfish Allergy] Nausea And  Vomiting  . Morphine Other (See Comments)    Bladder Spasms (intolerance) Tolerates dilaudid, fentanyl, oxycodone, hydrocodone  . Latex Rash and Other (See Comments)    Blistering of skin  . Pseudoephedrine Palpitations and Other (See Comments)    Tolerates XR formulation  . Red Dye Hives  . Silver Rash and Other (See Comments)    Tegaderm - if left on for > 24 hr    Medications:  Prior to Admission medications   Medication Sig Start Date End Date Taking? Authorizing Provider  Calcium Citrate-Vitamin D (CALCIUM CITRATE + PO) Take 1 tablet by mouth 3 (three) times daily.   Yes Historical Provider, MD  cloNIDine (CATAPRES) 0.1 MG tablet Take 0.1 mg by mouth daily.    Yes Historical Provider, MD  cyclobenzaprine (FLEXERIL) 10 MG tablet  Take 10 mg by mouth 3 (three) times daily as needed for muscle spasms.   Yes Historical Provider, MD  diazepam (VALIUM) 5 MG tablet Take 1 tablet (5 mg total) by mouth every 12 (twelve) hours as needed for anxiety. Patient taking differently: Take 5 mg by mouth every 12 (twelve) hours as needed (For anxiety associated with Clonidine.).  03/14/15  Yes Lucille Passy, MD  estradiol (ESTRACE) 1 MG tablet  10/22/16  Yes Historical Provider, MD  HYDROcodone-acetaminophen (NORCO/VICODIN) 5-325 MG tablet Take 1 tablet by mouth every 6 (six) hours as needed for moderate pain. 10/29/16  Yes Kathrynn Ducking, MD  LORazepam (ATIVAN) 1 MG tablet Take 1 mg by mouth once as needed (When getting MRIs.).    Yes Historical Provider, MD  magnesium oxide (MAG-OX) 400 MG tablet Take 400 mg by mouth 2 (two) times daily.    Yes Historical Provider, MD  meloxicam (MOBIC) 7.5 MG tablet Take 7.5 mg by mouth daily.   Yes Historical Provider, MD  metoprolol tartrate (LOPRESSOR) 25 MG tablet TAKE 1 TABLET BY MOUTH TWICE DAILY Patient taking differently: Take 25 mg by mouth daily.  05/28/14  Yes Lucille Passy, MD  metroNIDAZOLE (METROGEL) 1 % gel Apply 1 application topically daily. For rosacea with papular breakouts. 04/13/16  Yes Historical Provider, MD  ondansetron (ZOFRAN) 8 MG tablet Take 8 mg by mouth every 8 (eight) hours as needed for nausea or vomiting.  11/02/13  Yes Historical Provider, MD  ondansetron (ZOFRAN-ODT) 4 MG disintegrating tablet Take 1 tablet by mouth every 6 (six) hours as needed for nausea or vomiting.  08/04/15  Yes Historical Provider, MD  oxyCODONE-acetaminophen (PERCOCET/ROXICET) 5-325 MG tablet Take 1 tablet by mouth daily as needed (migraine refractory to triptans and Vicodin). 10/29/16  Yes Kathrynn Ducking, MD  pantoprazole (PROTONIX) 40 MG tablet TAKE 1 TABLET BY MOUTH ONCE DAILY 10/18/15  Yes Lucille Passy, MD  Pediatric Multiple Vitamins (CHEWABLE MULTIPLE VITAMINS PO) Take 1 tablet by mouth 2 (two) times  daily.    Yes Historical Provider, MD  promethazine (PHENERGAN) 25 MG suppository Place 1 suppository (25 mg total) rectally every 6 (six) hours as needed for nausea or vomiting. 09/08/15  Yes Johnathan Hausen, MD  promethazine (PHENERGAN) 25 MG tablet Take 25 mg by mouth every 4 (four) hours as needed for nausea or vomiting.  10/12/13  Yes Historical Provider, MD  rizatriptan (MAXALT) 10 MG tablet Take 1 tablet (10 mg total) by mouth 2 (two) times daily as needed for migraine. May repeat in 2 hours if needed 10/29/16  Yes Kathrynn Ducking, MD  vitamin B-12 (CYANOCOBALAMIN) 250 MCG tablet Take 250 mcg by  mouth daily.   Yes Historical Provider, MD    ROS:  Out of a complete 14 system review of symptoms, the patient complains only of the following symptoms, and all other reviewed systems are negative.  Light sensitivity Memory loss, headache, speech difficulty Rosacea Back pain  Blood pressure 119/73, pulse 84, height 5' 3.5" (1.613 m), weight 202 lb (91.6 kg).  Physical Exam  General: The patient is alert and cooperative at the time of the examination. The patient is moderately obese.   Skin: No significant peripheral edema is noted.   Neurologic Exam  Mental status: The patient is alert and oriented x 3 at the time of the examination. The patient has apparent normal recent and remote memory, with an apparently normal attention span and concentration ability.   Cranial nerves: Facial symmetry is present. Speech is normal, no aphasia or dysarthria is noted. Extraocular movements are full. Visual fields are full.  Motor: The patient has good strength in all 4 extremities.  Sensory examination: Soft touch sensation is symmetric on the face, arms, and legs.  Coordination: The patient has good finger-nose-finger and heel-to-shin bilaterally.  Gait and station: The patient has a normal gait. Tandem gait is normal. Romberg is negative. No drift is seen.  Reflexes: Deep tendon reflexes are  symmetric.   Assessment/Plan:  1. Migraine headache   2. Glioblastoma   The patient has done relatively well with her headaches until recently. The headaches become more frequent. The patient does not have a headache frequency that would justify a daily headache medication at this time, but if the headaches continue to increase in frequency, adding a medication such as Topamax or Zonegran may be indicated. The patient was given a prescription for her Maxalt, hydrocodone, and as well as for a small amount of Percocet. The patient will follow-up in one year, but she will be seen sooner if the headaches continue to increase in frequency. She will call if needed.    Jill Alexanders MD 10/29/2016 4:04 PM  [facility] [address] [place], [place] [address]  Phone [phone] Fax [phone]

## 2016-11-12 DIAGNOSIS — D509 Iron deficiency anemia, unspecified: Secondary | ICD-10-CM | POA: Diagnosis not present

## 2016-11-12 DIAGNOSIS — C712 Malignant neoplasm of temporal lobe: Secondary | ICD-10-CM | POA: Diagnosis not present

## 2016-11-12 DIAGNOSIS — I10 Essential (primary) hypertension: Secondary | ICD-10-CM | POA: Diagnosis not present

## 2016-11-12 DIAGNOSIS — D320 Benign neoplasm of cerebral meninges: Secondary | ICD-10-CM | POA: Diagnosis not present

## 2016-11-12 DIAGNOSIS — G43909 Migraine, unspecified, not intractable, without status migrainosus: Secondary | ICD-10-CM | POA: Diagnosis not present

## 2016-11-15 MED FILL — OXYCODONE W/APAP 5/325 TAB: 5-325 | 15 days supply | Qty: 15 | Fill #0

## 2016-11-15 MED FILL — HYDROCODON-APAP 5-325: 5-325 | 7 days supply | Qty: 30 | Fill #0

## 2016-11-15 MED FILL — PANTOPRAZOLE SOD DR 40 MG T: 40 | 90 days supply | Qty: 90 | Fill #0

## 2016-11-20 MED FILL — MELOXICAM 7.5 MG TABLET: 7.5 | 30 days supply | Qty: 30 | Fill #3

## 2016-11-20 MED FILL — CloNIDine HCL 0.1 MG TAB: 0.1 | 30 days supply | Qty: 60 | Fill #0

## 2016-11-21 MED FILL — CYCLOBENZAPRINE 10 MG TAB: 10 | 20 days supply | Qty: 60 | Fill #0

## 2016-11-30 ENCOUNTER — Encounter (HOSPITAL_COMMUNITY): Payer: Self-pay

## 2016-11-30 ENCOUNTER — Ambulatory Visit (HOSPITAL_COMMUNITY)
Admission: RE | Admit: 2016-11-30 | Discharge: 2016-11-30 | Disposition: A | Discharge status: Home / Self Care | Payer: 59 | Source: Ambulatory Visit | Attending: Medical Oncology | Admitting: Medical Oncology

## 2016-11-30 DIAGNOSIS — C719 Malignant neoplasm of brain, unspecified: Secondary | ICD-10-CM | POA: Insufficient documentation

## 2016-11-30 MED ORDER — HEPARIN SOD (PORK) LOCK FLUSH 100 UNIT/ML IV SOLN
500.0000 [IU] | INTRAVENOUS | Status: AC
  Administered 2016-11-30: 500 [IU] via INTRAVENOUS
  Filled 2016-11-30: qty 5

## 2016-11-30 MED ORDER — SODIUM CHLORIDE 0.9% FLUSH
20.0000 mL | INTRAVENOUS | Status: AC
  Administered 2016-11-30: 20 mL

## 2016-12-04 ENCOUNTER — Ambulatory Visit: Payer: 59 | Admitting: Skilled Nursing Facility1

## 2016-12-14 ENCOUNTER — Ambulatory Visit (HOSPITAL_COMMUNITY): Payer: 59

## 2016-12-18 ENCOUNTER — Telehealth: Payer: Self-pay | Admitting: *Deleted

## 2016-12-18 NOTE — Telephone Encounter (Signed)
Called and spoke to pt. She is requesting intermittent FMLA. 3-4 episodes per month.  Her oncologist was filling out FMLA but she is currently showing no active cancer.  She is just having more frequent migraines. Advised I will have CW,MD review paperwork. She will have to come a sign record release form for [place] to send paperwork to [employer]. There is also 50 dollar fee with paperwork. She verbalized understanding.

## 2016-12-19 NOTE — Telephone Encounter (Signed)
Gave completed/signed FMLA to medical records to process.

## 2016-12-24 DIAGNOSIS — Z0289 Encounter for other administrative examinations: Secondary | ICD-10-CM

## 2016-12-26 MED FILL — METOPROLOL TARTRATE 25 MG T: 25 | 90 days supply | Qty: 180 | Fill #0

## 2016-12-26 MED FILL — MELOXICAM 7.5 MG TABLET: 7.5 | 30 days supply | Qty: 30 | Fill #0

## 2017-01-08 ENCOUNTER — Encounter: Payer: 59 | Attending: Surgery | Admitting: Skilled Nursing Facility1

## 2017-01-08 ENCOUNTER — Encounter: Payer: Self-pay | Admitting: Skilled Nursing Facility1

## 2017-01-08 DIAGNOSIS — Z713 Dietary counseling and surveillance: Secondary | ICD-10-CM | POA: Diagnosis not present

## 2017-01-08 DIAGNOSIS — Z6835 Body mass index (BMI) 35.0-35.9, adult: Secondary | ICD-10-CM | POA: Insufficient documentation

## 2017-01-08 NOTE — Patient Instructions (Signed)
-  Kefir: in the yogurt aisle  -vegetables: celery, cucumber, tomato, carrots  -sugar free popsicle  -Nuts and granola  -[place] yogurt with the flips  -Ranch powder, plain non-fat greek yogurt, a twist of lemon  -Keep up the amazing work!!

## 2017-01-08 NOTE — Progress Notes (Signed)
  Follow-up visit: 1 year Post-Operative Sleeve gastrectomy Surgery  Medical Nutrition Therapy:  Appt start time: 325 end time:  405  Primary concerns today: Post-operative Bariatric Surgery Nutrition Management.  Pt states for the last 3 months she has been under tremendous stress. Pt states she used to eat out on the weekends but now that she has her mother with autism she does not have that time to spend with her husband. Pt states her son lives with her and her husband. Pt states she turns to calorically dense foods when she is stressed and her mother eats fried chicken and sweets so she has those temptations in the house.   Surgery date: 09/06/15 Surgery type: sleeve gastrectomy Start weight at [facility]: 285 lbs on 06/08/15 (highest weight 297 lbs) Weight today: 203.6 lbs Weight change: 3 lbs Total weight lost: 94 lbs   TANITA  BODY COMP RESULTS  08/15/15 09/20/15 11/07/15 02/02/16 05/03/16 09/06/16 01/08/2017   BMI (kg/m^2) 50.3 46.1 43 39.6 37.7 36.5 35.5   Fat Mass (lbs) 149.5 146.5 129.5 97.5 89 81.4 82.2   Fat Free Mass (lbs) 134.5 113.5 113 126 123.6 124.8 121.4   Total Body Water (lbs) 98.5 83.0 82.5 92 88.8 89.4 87    Preferred Learning Style:   No preference indicated   Learning Readiness:   Ready  24-hr recall: B (AM): Eggs and toast or pack of crackers and sausage (14g) Or protein shake (30g) Snk (AM): meat and nuts L (PM): 3 oz meat and vegetable (21g) Snk (PM): popcorn D (PM): 3 oz meat and vegetable or salad (21g) OR pasta or fried fish Snk (PM): not usually  Fluid intake: about 60 oz Crystal Light, Nature's Twist Estimated total protein intake: 53-73g  Medications: see list Supplementation: taking pill form  Using straws: no Drinking while eating: not usually; sometimes sips Hair loss: slowing down Carbonated beverages: has diet mountain dew "extremely rarely" N/V/D/C: irregular bowel movements, 1 every 2-3 days; sometimes regurgitation if she eats too  much Dumping syndrome: none  Recent physical activity:  "lacking," on her feet a lot at work  Progress Towards Goal(s):  In progress.  Handouts given during visit include:  none   Nutritional Diagnosis:  [place]-3.3 Overweight/obesity related to past poor dietary habits and physical inactivity as evidenced by patient w/ recent sleeve gatsrectomy surgery following dietary guidelines for continued weight loss.  Intervention:  Nutrition counseling provided. Goals: -Kefir: in the yogurt aisle -vegetables: celery, cucumber, tomato, carrots -sugar free popsicle -Nuts and granola -[place] yogurt with the flips -Ranch powder, plain non-fat greek yogurt, a twist of lemon -Keep up the amazing work!!  -Look into an alzheimer's support group Teaching Method Utilized:  Ship broker Hands on  Barriers to learning/adherence to lifestyle change: none  Demonstrated degree of understanding via:  Teach Back   Monitoring/Evaluation:  Dietary intake, exercise, and body weight.

## 2017-01-11 ENCOUNTER — Ambulatory Visit (HOSPITAL_COMMUNITY)
Admission: RE | Admit: 2017-01-11 | Discharge: 2017-01-11 | Disposition: A | Discharge status: Home / Self Care | Payer: 59 | Source: Ambulatory Visit | Attending: Medical Oncology | Admitting: Medical Oncology

## 2017-01-25 MED FILL — MELOXICAM 7.5 MG TABLET: 7.5 | 30 days supply | Qty: 30 | Fill #1

## 2017-01-25 MED FILL — CYCLOBENZAPRINE 10 MG TAB: 10 | 20 days supply | Qty: 60 | Fill #1

## 2017-01-25 MED FILL — CloNIDine HCL 0.1 MG TAB: 0.1 | 30 days supply | Qty: 60 | Fill #1

## 2017-02-18 MED FILL — MELOXICAM 7.5 MG TABLET: 7.5 | 30 days supply | Qty: 30 | Fill #2

## 2017-02-18 MED FILL — PANTOPRAZOLE SOD DR 40 MG T: 40 | 90 days supply | Qty: 90 | Fill #0

## 2017-03-22 MED FILL — CloNIDine HCL 0.1 MG TAB: 0.1 | 30 days supply | Qty: 60 | Fill #2

## 2017-03-22 MED FILL — MELOXICAM 7.5 MG TABLET: 7.5 | 30 days supply | Qty: 30 | Fill #3

## 2017-03-22 MED FILL — CYCLOBENZAPRINE 10 MG TABLE: 10 | 20 days supply | Qty: 60 | Fill #2

## 2017-04-11 ENCOUNTER — Ambulatory Visit: Payer: 59 | Admitting: Skilled Nursing Facility1

## 2017-04-12 ENCOUNTER — Ambulatory Visit (HOSPITAL_COMMUNITY)
Admission: RE | Admit: 2017-04-12 | Discharge: 2017-04-12 | Disposition: A | Discharge status: Home / Self Care | Payer: 59 | Source: Ambulatory Visit | Attending: Medical Oncology | Admitting: Medical Oncology

## 2017-04-29 ENCOUNTER — Encounter: Payer: 59 | Attending: Surgery | Admitting: Skilled Nursing Facility1

## 2017-04-29 ENCOUNTER — Encounter: Payer: Self-pay | Admitting: Skilled Nursing Facility1

## 2017-04-29 DIAGNOSIS — E669 Obesity, unspecified: Secondary | ICD-10-CM | POA: Diagnosis not present

## 2017-04-29 DIAGNOSIS — Z713 Dietary counseling and surveillance: Secondary | ICD-10-CM | POA: Diagnosis not present

## 2017-04-29 NOTE — Patient Instructions (Addendum)
-  Get bariatric advantage or Celebrate for your multivitamin   -Have a small piece of fruit or vegetable with your protein shale sin the morning   -Fast breakfast:  [place] yogurt  Fiberone cereal with fat free cows milk  Chicken wrapped in cheese  Ricotta bake done the day before and just reheat in the morning  Microwaveable bacon with cheese    -Lunch:  Whole wheat bread with bacon, spinach and light mayo with carrot sticks on the side  Tortilla with low fat lunch meat and low fat cheese  With cucumbers dipped in greek yogurt with ranch powder and a twist of lemon  Pack of flavored tuna  -Chew your food until applesauce consistency   -Keep up the great movement!!

## 2017-04-29 NOTE — Progress Notes (Signed)
Follow-up visit: 1 year Post-Operative Sleeve gastrectomy Surgery  Medical Nutrition Therapy:  Appt start time: 325 end time:  405  Primary concerns today: Post-operative Bariatric Surgery Nutrition Management.  Pt states she saw her surgeon recently. Pt states she has been stress eating. Work Change from 5am to 1:30 for work getting up at [employer], [place] her appetite has changed since then, 3 weekends out of a month she has her mother which has alzeimers. Eating fast food and quick not healthy food. 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 Pt states she had her first migraine in a long while. And more lately possibly due to stress and weather. stating she has an mri august 6th. Pt states if she drinks a premier protein shakes every day she will get constipated. Pt states the protein shakes help curve cravings. Pt states her goal is to take the steps without being short of breath.    Surgery date: 09/06/15 Surgery type: sleeve gastrectomy [facility] weight at [facility]: 285 lbs on 06/08/15 (highest weight 297 lbs) Weight today: 211 lbs  TANITA  BODY COMP RESULTS  08/15/15 09/20/15 11/07/15 02/02/16 05/03/16 09/06/16 01/08/2017 0723/2018   BMI (kg/m^2) 50.3 46.1 43 39.6 37.7 36.5 35.5 36.8   Fat Mass (lbs) 149.5 146.5 129.5 97.5 89 81.4 82.2 93.6   Fat Free Mass (lbs) 134.5 113.5 113 126 123.6 124.8 121.4 117.4   Total Body Water (lbs) 98.5 83.0 82.5 92 88.8 89.4 87 84.2    Preferred Learning Style:   No preference indicated   Learning Readiness:   Ready  24-hr recall: B (AM): Eggs and toast or pack of crackers and sausage (14g) Or protein shake (30g) or nut and fruit bar Snk (AM): meat and nuts L (PM): 3 oz meat and vegetable (21g) Snk (PM): popcorn D (PM): 3 oz meat and vegetable or salad (21g) OR pasta or fried fish Snk (PM): not usually  Fluid intake: about 64+ oz Crystal Light, Nature's  Twist, diet mountain dew (increased from before), unsweet tea Estimated total protein intake: 53-73g  Medications: see list Supplementation: multivitamin, calcium, magnesium, vitamin d,   Using straws: no Drinking while eating: not usually; sometimes sips Hair loss: slowing down Carbonated beverages: has diet mountain dew N/V/D/C: irregular bowel movements, 1 every 2-3 days; sometimes regurgitation if she eats too much Dumping syndrome: none  Recent physical activity:  30 minutes 3-4 days a week  Progress Towards Goal(s):  In progress.  Handouts given during visit include:  none   Nutritional Diagnosis:  [place]-3.3 Overweight/obesity related to past poor dietary habits and physical inactivity as evidenced by patient w/ recent sleeve gatsrectomy surgery following dietary guidelines for continued weight loss.  Intervention:  Nutrition counseling provided. Goals: -Get bariatric advantage or Celebrate for your multivitamin  -Have a small piece of fruit or vegetable with your protein shale sin the morning  -Fast breakfast:  [place] yogurt  Fiberone cereal with fat free cows milk  Chicken wrapped in cheese  Ricotta bake done the day before and just reheat in the morning  Microwaveable bacon with cheese  -Lunch:  Whole wheat bread with bacon, spinach and light mayo with carrot sticks on the side  Tortilla with low fat lunch meat and low fat cheese  With cucumbers dipped in greek yogurt with ranch powder and a twist of lemon  Pack of flavored tuna -Chew your food until applesauce consistency  -  Keep up the great movement!!  Teaching Method Utilized:  Visual Auditory Hands on  Barriers to learning/adherence to lifestyle change: none  Demonstrated degree of understanding via:  Teach Back   Monitoring/Evaluation:  Dietary intake, exercise, and body weight.

## 2017-04-30 ENCOUNTER — Other Ambulatory Visit: Payer: Self-pay | Admitting: Obstetrics and Gynecology

## 2017-04-30 DIAGNOSIS — Z1231 Encounter for screening mammogram for malignant neoplasm of breast: Secondary | ICD-10-CM

## 2017-05-08 DIAGNOSIS — L719 Rosacea, unspecified: Secondary | ICD-10-CM | POA: Diagnosis not present

## 2017-05-13 DIAGNOSIS — Z8739 Personal history of other diseases of the musculoskeletal system and connective tissue: Secondary | ICD-10-CM | POA: Diagnosis not present

## 2017-05-13 DIAGNOSIS — Z85841 Personal history of malignant neoplasm of brain: Secondary | ICD-10-CM | POA: Diagnosis not present

## 2017-05-13 DIAGNOSIS — R000 Tachycardia, unspecified: Secondary | ICD-10-CM | POA: Diagnosis not present

## 2017-05-13 DIAGNOSIS — Z872 Personal history of diseases of the skin and subcutaneous tissue: Secondary | ICD-10-CM | POA: Diagnosis not present

## 2017-05-13 DIAGNOSIS — M519 Unspecified thoracic, thoracolumbar and lumbosacral intervertebral disc disorder: Secondary | ICD-10-CM | POA: Diagnosis not present

## 2017-05-13 DIAGNOSIS — I10 Essential (primary) hypertension: Secondary | ICD-10-CM | POA: Diagnosis not present

## 2017-05-13 DIAGNOSIS — G9389 Other specified disorders of brain: Secondary | ICD-10-CM | POA: Diagnosis not present

## 2017-05-13 DIAGNOSIS — Z8669 Personal history of other diseases of the nervous system and sense organs: Secondary | ICD-10-CM | POA: Diagnosis not present

## 2017-05-13 DIAGNOSIS — Z9889 Other specified postprocedural states: Secondary | ICD-10-CM | POA: Diagnosis not present

## 2017-05-13 DIAGNOSIS — Z95828 Presence of other vascular implants and grafts: Secondary | ICD-10-CM | POA: Diagnosis not present

## 2017-05-13 DIAGNOSIS — Z08 Encounter for follow-up examination after completed treatment for malignant neoplasm: Secondary | ICD-10-CM | POA: Diagnosis not present

## 2017-05-13 DIAGNOSIS — D320 Benign neoplasm of cerebral meninges: Secondary | ICD-10-CM | POA: Diagnosis not present

## 2017-05-13 DIAGNOSIS — C712 Malignant neoplasm of temporal lobe: Secondary | ICD-10-CM | POA: Diagnosis not present

## 2017-05-22 ENCOUNTER — Ambulatory Visit
Admission: RE | Admit: 2017-05-22 | Discharge: 2017-05-22 | Disposition: A | Discharge status: Home / Self Care | Payer: 59 | Source: Ambulatory Visit | Attending: Obstetrics and Gynecology | Admitting: Obstetrics and Gynecology

## 2017-05-22 DIAGNOSIS — Z1231 Encounter for screening mammogram for malignant neoplasm of breast: Secondary | ICD-10-CM | POA: Diagnosis not present

## 2017-05-22 MED FILL — PANTOPRAZOLE SOD DR 40 MG T: 40 | 90 days supply | Qty: 90 | Fill #0

## 2017-05-22 MED FILL — CYCLOBENZAPRINE 10 MG TABLE: 10 | 20 days supply | Qty: 60 | Fill #3

## 2017-05-22 MED FILL — MELOXICAM 7.5 MG TABLET: 7.5 | 30 days supply | Qty: 30 | Fill #0

## 2017-05-22 MED FILL — metroNIDAZOLE 1 % GEL: 1 | 20 days supply | Qty: 60 | Fill #0

## 2017-05-22 MED FILL — CloNIDine HCL 0.1 MG TAB: 0.1 | 30 days supply | Qty: 60 | Fill #0

## 2017-05-30 ENCOUNTER — Encounter (HOSPITAL_COMMUNITY): Payer: Self-pay

## 2017-05-30 ENCOUNTER — Ambulatory Visit (HOSPITAL_COMMUNITY)
Admission: RE | Admit: 2017-05-30 | Discharge: 2017-05-30 | Disposition: A | Discharge status: Home / Self Care | Payer: 59 | Source: Ambulatory Visit | Attending: Medical Oncology | Admitting: Medical Oncology

## 2017-05-30 DIAGNOSIS — C719 Malignant neoplasm of brain, unspecified: Secondary | ICD-10-CM | POA: Insufficient documentation

## 2017-05-30 MED ORDER — SODIUM CHLORIDE 0.9% FLUSH
20.0000 mL | INTRAVENOUS | Status: DC
  Administered 2017-05-30: 20 mL

## 2017-05-30 MED ORDER — HEPARIN SOD (PORK) LOCK FLUSH 100 UNIT/ML IV SOLN
500.0000 [IU] | INTRAVENOUS | Status: AC | PRN
  Administered 2017-05-30: 500 [IU]
  Filled 2017-05-30: qty 5

## 2017-06-06 MED FILL — RHOFADE 1% CREAM: 1 | 30 days supply | Qty: 30 | Fill #0

## 2017-06-20 DIAGNOSIS — H5203 Hypermetropia, bilateral: Secondary | ICD-10-CM | POA: Diagnosis not present

## 2017-06-25 ENCOUNTER — Telehealth: Payer: Self-pay | Admitting: Neurology

## 2017-06-25 MED ORDER — RIZATRIPTAN BENZOATE 10 MG PO TABS: 10.0000 mg | ORAL_TABLET | Freq: Two times a day (BID) | ORAL | 5 refills | Status: DC | PRN

## 2017-06-25 MED ORDER — OXYCODONE-ACETAMINOPHEN 5-325 MG PO TABS: 1.0000 | ORAL_TABLET | Freq: Every day | ORAL | 0 refills | Status: DC | PRN

## 2017-06-25 MED ORDER — HYDROCODONE-ACETAMINOPHEN 5-325 MG PO TABS: 1.0000 | ORAL_TABLET | Freq: Four times a day (QID) | ORAL | 0 refills | Status: DC | PRN

## 2017-06-25 MED FILL — METOPROLOL TARTRATE 25 MG T: 25 | 90 days supply | Qty: 180 | Fill #1

## 2017-06-25 MED FILL — CYCLOBENZAPRINE 10 MG TABLE: 10 | 20 days supply | Qty: 60 | Fill #0

## 2017-06-25 MED FILL — CloNIDine HCL 0.1 MG TAB: 0.1 | 30 days supply | Qty: 60 | Fill #1

## 2017-06-25 MED FILL — RIZATRIPTAN 10 MG TABLET: 10 | 17 days supply | Qty: 10 | Fill #0

## 2017-06-25 MED FILL — MELOXICAM 7.5 MG TABLET: 7.5 | 30 days supply | Qty: 30 | Fill #1

## 2017-06-25 NOTE — Addendum Note (Signed)
Addended by: Kathrynn Ducking on: 06/25/2017 10:17 AM   Modules accepted: Orders

## 2017-06-25 NOTE — Telephone Encounter (Signed)
Prescription for Maxalt, oxycodone, and hydrocodone were given

## 2017-06-25 NOTE — Telephone Encounter (Signed)
Placed printed/signed rx's up front for patient pick up.

## 2017-06-25 NOTE — Telephone Encounter (Signed)
Pt calling for refill of  rizatriptan (MAXALT) 10 MG tablet oxyCODONE-acetaminophen (PERCOCET/ROXICET) 5-325 MG tablet HYDROcodone-acetaminophen (NORCO/VICODIN) 5-325 MG tablet   Pt still using  [place] [place] - [place] [phone] (Phone) [phone] (Fax)

## 2017-07-01 MED FILL — OXYCOD/ACETAMINOPHEN 5-325M: 5-325 | 15 days supply | Qty: 15 | Fill #0

## 2017-07-01 MED FILL — HYDROCODON-APAP 5-325: 5-325 | 8 days supply | Qty: 30 | Fill #0

## 2017-07-29 ENCOUNTER — Ambulatory Visit: Payer: 59 | Admitting: Skilled Nursing Facility1

## 2017-08-13 DIAGNOSIS — Z01419 Encounter for gynecological examination (general) (routine) without abnormal findings: Secondary | ICD-10-CM | POA: Diagnosis not present

## 2017-08-13 DIAGNOSIS — Z6838 Body mass index (BMI) 38.0-38.9, adult: Secondary | ICD-10-CM | POA: Diagnosis not present

## 2017-08-13 MED FILL — MELOXICAM 7.5 MG TABLET: 7.5 | 30 days supply | Qty: 30 | Fill #2

## 2017-08-13 MED FILL — RHOFADE 1% CREAM: 1 | 30 days supply | Qty: 30 | Fill #1

## 2017-08-26 MED FILL — valACYclovir HCL 1 GM TABS: 1 | 30 days supply | Qty: 30 | Fill #0

## 2017-09-04 MED FILL — MELOXICAM 7.5 MG TABLET: 7.5 | 30 days supply | Qty: 30 | Fill #3

## 2017-09-04 MED FILL — PANTOPRAZOLE SOD DR 40 MG T: 40 | 90 days supply | Qty: 90 | Fill #1

## 2017-09-04 MED FILL — CYCLOBENZAPRINE 10 MG TAB: 10 | 20 days supply | Qty: 60 | Fill #1

## 2017-09-18 ENCOUNTER — Ambulatory Visit: Payer: 59 | Admitting: Skilled Nursing Facility1

## 2017-10-24 MED FILL — CloNIDine HCL 0.1 MG TAB: 0.1 | 30 days supply | Qty: 60 | Fill #2

## 2017-10-24 MED FILL — MELOXICAM 7.5 MG TABLET: 7.5 | 30 days supply | Qty: 30 | Fill #0

## 2017-10-29 ENCOUNTER — Encounter: Payer: Self-pay | Admitting: Neurology

## 2017-10-29 ENCOUNTER — Ambulatory Visit: Payer: 59 | Admitting: Neurology

## 2017-10-29 VITALS — BP 137/97 | HR 104 | Ht 63.5 in | Wt 218.5 lb

## 2017-10-29 DIAGNOSIS — C719 Malignant neoplasm of brain, unspecified: Secondary | ICD-10-CM | POA: Diagnosis not present

## 2017-10-29 DIAGNOSIS — G43709 Chronic migraine without aura, not intractable, without status migrainosus: Secondary | ICD-10-CM

## 2017-10-29 MED ORDER — HYDROCODONE-ACETAMINOPHEN 5-325 MG PO TABS: 1.0000 | ORAL_TABLET | Freq: Four times a day (QID) | ORAL | 0 refills | Status: DC | PRN

## 2017-10-29 MED ORDER — OXYCODONE-ACETAMINOPHEN 5-325 MG PO TABS: 1.0000 | ORAL_TABLET | Freq: Every day | ORAL | 0 refills | Status: DC | PRN

## 2017-10-29 MED FILL — HYDROCODON-APAP 5-325: 5-325 | 7 days supply | Qty: 30 | Fill #0

## 2017-10-29 MED FILL — OXYCOD/ACETAMINOPHEN 5-325M: 5-325 | 15 days supply | Qty: 15 | Fill #0

## 2017-10-29 NOTE — Progress Notes (Signed)
Reason for visit: Migraine headache  [name] [name] is an 57 y.o. female  History of present illness:  [name] [name] is a 57 year old right-handed white female with a history of a glioblastoma involving the right temporal and occipital area.  The patient has been followed for over 12 years with the tumor, she continues to do very well.  The patient does have migraine headaches, she indicates that weather changes are the biggest activator for her headache.  The headache may be associated with nausea and vomiting, she may have photophobia with the headache.  The patient will have 3 or 4 headaches a month.  She will start with Maxalt if this is not effective hydrocodone will be used and then oxycodone or Phenergan.  The patient overall is satisfied with her current treatment regimen.  She returns to this office for an evaluation.  Past Medical History:  Diagnosis Date  . Anemia   . Bladder spasms   . Brain cancer ([facility] [facility]) 10/09/05   glioblastoma right temp/occipital  . Complication of anesthesia   . DDD (degenerative disc disease), lumbar   . Depression    Migraines  . GERD (gastroesophageal reflux disease)   . Glioblastoma multiforme ([place]) 2007   Followed by Dr. Vedia Coffer at [facility]  . Hypertension    benign  . Microcytic anemia    iron def  2ndary chronic blood loss  . Migraine, unspecified, without mention of intractable migraine without mention of status migrainosus   . Obesity   . Overweight(278.02)   . PVC (premature ventricular contraction)   . Rash    occasional recurrent  . Tachycardia, unspecified   . Viral meningitis     Past Surgical History:  Procedure Laterality Date  . ABDOMINAL HYSTERECTOMY    . BACK SURGERY     lumbar  . BILATERAL SALPINGOOPHORECTOMY    . BLADDER SURGERY     tack  . BRAIN SURGERY  2014   resection of glioblastoma multiform  . CESAREAN SECTION     x 2   . CYSTOCELE REPAIR    . gamma knife     tx of brain tumor  . LAPAROSCOPIC GASTRIC  SLEEVE RESECTION WITH HIATAL HERNIA REPAIR N/A 09/06/2015   Procedure: LAPAROSCOPIC GASTRIC SLEEVE RESECTION WITH HIATAL HERNIA REPAIR;  Surgeon: Johnathan Hausen, MD;  Location: [facility];  Service: General;  Laterality: N/A;  . PORTACATH PLACEMENT    . UPPER GI ENDOSCOPY  09/06/2015   Procedure: UPPER GI ENDOSCOPY;  Surgeon: Johnathan Hausen, MD;  Location: [facility];  Service: General;;    Family History  Problem Relation Age of Onset  . Cancer Other        tongue  . Heart disease Mother        bypass age 33  . Heart disease Father        heart failure, atrial fib  . Diabetes Father   . Liver disease Brother   . Diabetes Brother   . Diabetes Sister   . Motor neuron disease Brother   . Colon cancer Neg Hx   . Breast cancer Neg Hx     Social history:  reports that  has never smoked. she has never used smokeless tobacco. She reports that she does not drink alcohol or use drugs.    Allergies  Allergen Reactions  . Shrimp [Shellfish Allergy] Nausea And Vomiting  . Morphine Other (See Comments)    Bladder Spasms (intolerance) Tolerates dilaudid, fentanyl, oxycodone, hydrocodone  . Latex Rash  and Other (See Comments)    Blistering of skin  . Pseudoephedrine Palpitations and Other (See Comments)    Tolerates XR formulation  . Red Dye Hives  . Silver Rash and Other (See Comments)    Tegaderm - if left on for > 24 hr    Medications:  Prior to Admission medications   Medication Sig Start Date End Date Taking? Authorizing Provider  Calcium Citrate-Vitamin D (CALCIUM CITRATE + PO) Take 1 tablet by mouth 3 (three) times daily.   Yes [provider]  cloNIDine (CATAPRES) 0.1 MG tablet Take 0.1 mg by mouth daily.    Yes [provider]  cyclobenzaprine (FLEXERIL) 10 MG tablet Take 10 mg by mouth 3 (three) times daily as needed for muscle spasms.   Yes [provider]  diazepam (VALIUM) 5 MG tablet Take 1 tablet (5 mg total) by mouth every 12 (twelve) hours as  needed for anxiety. Patient taking differently: Take 5 mg by mouth every 12 (twelve) hours as needed (For anxiety associated with Clonidine.).  03/14/15  Yes Lucille Passy, MD  estradiol (ESTRACE) 1 MG tablet  10/22/16  Yes [provider]  HYDROcodone-acetaminophen (NORCO/VICODIN) 5-325 MG tablet Take 1 tablet by mouth every 6 (six) hours as needed for moderate pain. 06/25/17  Yes Kathrynn Ducking, MD  LORazepam (ATIVAN) 1 MG tablet Take 1 mg by mouth once as needed (When getting MRIs.).    Yes [provider]  magnesium oxide (MAG-OX) 400 MG tablet Take 400 mg by mouth 2 (two) times daily.    Yes [provider]  meloxicam (MOBIC) 7.5 MG tablet Take 7.5 mg by mouth daily.   Yes [provider]  metoprolol tartrate (LOPRESSOR) 25 MG tablet TAKE 1 TABLET BY MOUTH TWICE DAILY Patient taking differently: Take 25 mg by mouth daily.  05/28/14  Yes Lucille Passy, MD  metroNIDAZOLE (METROGEL) 1 % gel Apply 1 application topically daily. For rosacea with papular breakouts. 04/13/16  Yes [provider]  ondansetron (ZOFRAN) 8 MG tablet Take 8 mg by mouth every 8 (eight) hours as needed for nausea or vomiting.  11/02/13  Yes [provider]  ondansetron (ZOFRAN-ODT) 4 MG disintegrating tablet Take 1 tablet by mouth every 6 (six) hours as needed for nausea or vomiting.  08/04/15  Yes [provider]  oxyCODONE-acetaminophen (PERCOCET/ROXICET) 5-325 MG tablet Take 1 tablet by mouth daily as needed (migraine refractory to triptans and Vicodin). 06/25/17  Yes Kathrynn Ducking, MD  Oxymetazoline HCl (RHOFADE) 1 % CREA Apply 1 application topically as needed.   Yes [provider]  pantoprazole (PROTONIX) 40 MG tablet TAKE 1 TABLET BY MOUTH ONCE DAILY 10/18/15  Yes Lucille Passy, MD  Pediatric Multiple Vitamins (CHEWABLE MULTIPLE VITAMINS PO) Take 1 tablet by mouth 2 (two) times daily.    Yes [provider]  promethazine (PHENERGAN) 25 MG  suppository Place 1 suppository (25 mg total) rectally every 6 (six) hours as needed for nausea or vomiting. 09/08/15  Yes Johnathan Hausen, MD  promethazine (PHENERGAN) 25 MG tablet Take 25 mg by mouth every 4 (four) hours as needed for nausea or vomiting.  10/12/13  Yes [provider]  rizatriptan (MAXALT) 10 MG tablet Take 1 tablet (10 mg total) by mouth 2 (two) times daily as needed for migraine. May repeat in 2 hours if needed 06/25/17  Yes Kathrynn Ducking, MD  vitamin B-12 (CYANOCOBALAMIN) 250 MCG tablet Take 250 mcg by mouth daily.  Yes [provider]    ROS:  Out of a complete 14 system review of symptoms, the patient complains only of the following symptoms, and all other reviewed systems are negative.  Balance problems Headache  Blood pressure (!) 137/97, pulse (!) 104, height 5' 3.5" (1.613 m), weight 218 lb 8 oz (99.1 kg).  Physical Exam  General: The patient is alert and cooperative at the time of the examination.  The patient is markedly obese.  Skin: No significant peripheral edema is noted.   Neurologic Exam  Mental status: The patient is alert and oriented x 3 at the time of the examination. The patient has apparent normal recent and remote memory, with an apparently normal attention span and concentration ability.   Cranial nerves: Facial symmetry is present. Speech is normal, no aphasia or dysarthria is noted. Extraocular movements are full. Visual fields are full.  Motor: The patient has good strength in all 4 extremities.  Sensory examination: Soft touch sensation is symmetric on the face, arms, and legs.  Coordination: The patient has good finger-nose-finger and heel-to-shin bilaterally.  Gait and station: The patient has a normal gait. Tandem gait is normal. Romberg is negative. No drift is seen.  Reflexes: Deep tendon reflexes are symmetric.   Assessment/Plan:  1.  Migraine headache  2.  Glioblastoma  The patient is being  followed for her glioblastoma, she will have a MRI of the brain in February 2019.  The patient will continue the Maxalt if needed, a prescription was given for hydrocodone and oxycodone.  She will follow-up in 1 year.  Jill Alexanders MD 10/29/2017 4:33 PM  [facility] [address] [place], [place] [address]  Phone [phone] Fax [phone]

## 2017-11-11 DIAGNOSIS — M519 Unspecified thoracic, thoracolumbar and lumbosacral intervertebral disc disorder: Secondary | ICD-10-CM | POA: Diagnosis not present

## 2017-11-11 DIAGNOSIS — C712 Malignant neoplasm of temporal lobe: Secondary | ICD-10-CM | POA: Diagnosis not present

## 2017-11-11 DIAGNOSIS — C719 Malignant neoplasm of brain, unspecified: Secondary | ICD-10-CM | POA: Diagnosis not present

## 2017-11-11 DIAGNOSIS — M545 Low back pain: Secondary | ICD-10-CM | POA: Diagnosis not present

## 2017-11-11 DIAGNOSIS — D500 Iron deficiency anemia secondary to blood loss (chronic): Secondary | ICD-10-CM | POA: Diagnosis not present

## 2017-12-03 MED FILL — CYCLOBENZAPRINE 10 MG TAB: 10 | 20 days supply | Qty: 60 | Fill #2

## 2017-12-03 MED FILL — RHOFADE 1% CREAM: 1 | 30 days supply | Qty: 30 | Fill #2

## 2017-12-03 MED FILL — METOPROLOL TARTRATE 25 MG T: 25 | 90 days supply | Qty: 180 | Fill #2

## 2017-12-03 MED FILL — PANTOPRAZOLE SOD DR 40 MG T: 40 | 90 days supply | Qty: 90 | Fill #2

## 2017-12-03 MED FILL — MELOXICAM 7.5 MG TABLET: 7.5 | 30 days supply | Qty: 30 | Fill #1

## 2017-12-03 MED FILL — CloNIDine HCL 0.1 MG TAB: 0.1 | 30 days supply | Qty: 60 | Fill #3

## 2018-02-13 MED FILL — RIZATRIPTAN BENZOATE 10 MG: 10 | 17 days supply | Qty: 10 | Fill #1

## 2018-02-13 MED FILL — CloNIDine HCL 0.1 MG TAB: 0.1 | 30 days supply | Qty: 60 | Fill #4

## 2018-02-13 MED FILL — MELOXICAM 7.5 MG TABLET: 7.5 | 30 days supply | Qty: 30 | Fill #2

## 2018-02-13 MED FILL — RHOFADE 1% CREAM: 1 | 30 days supply | Qty: 30 | Fill #3

## 2018-02-13 MED FILL — CYCLOBENZAPRINE 10 MG TAB: 10 | 20 days supply | Qty: 60 | Fill #3

## 2018-02-13 MED FILL — valACYclovir HCL 1 GM TABS: 1 | 30 days supply | Qty: 30 | Fill #1

## 2018-02-24 DIAGNOSIS — C712 Malignant neoplasm of temporal lobe: Secondary | ICD-10-CM | POA: Diagnosis not present

## 2018-03-04 ENCOUNTER — Telehealth: Payer: Self-pay | Admitting: Neurology

## 2018-03-04 MED ORDER — OXYCODONE-ACETAMINOPHEN 5-325 MG PO TABS: 1.0000 | ORAL_TABLET | Freq: Every day | ORAL | 0 refills | Status: DC | PRN

## 2018-03-04 MED ORDER — HYDROCODONE-ACETAMINOPHEN 5-325 MG PO TABS: 1.0000 | ORAL_TABLET | Freq: Four times a day (QID) | ORAL | 0 refills | Status: DC | PRN

## 2018-03-04 MED ORDER — RIZATRIPTAN BENZOATE 10 MG PO TABS: 10.0000 mg | ORAL_TABLET | Freq: Two times a day (BID) | ORAL | 5 refills | Status: DC | PRN

## 2018-03-04 MED FILL — RIZATRIPTAN BENZOATE 10 MG: 10 | 30 days supply | Qty: 10 | Fill #0

## 2018-03-04 MED FILL — HYDROCODON-APAP 5-325: 5-325 | 8 days supply | Qty: 30 | Fill #0

## 2018-03-04 MED FILL — OXYCODONE-ACETAMINOPHEN 5-3: 5-325 | 15 days supply | Qty: 15 | Fill #0

## 2018-03-04 NOTE — Telephone Encounter (Signed)
E-scribed rx maxalt to [facility]

## 2018-03-04 NOTE — Addendum Note (Signed)
Addended by: Kathrynn Ducking on: 03/04/2018 05:33 PM   Modules accepted: Orders

## 2018-03-04 NOTE — Telephone Encounter (Signed)
I will refill the hydrocodone and oxycodone.

## 2018-03-04 NOTE — Telephone Encounter (Signed)
Pt requesting a refill for oxyCODONE-acetaminophen (PERCOCET/ROXICET) 5-325 MG tablet, HYDROcodone-acetaminophen (NORCO/VICODIN) 5-325 MG tablet and rizatriptan (MAXALT) 10 MG tablet sent to [facility] out pt pharmacy

## 2018-03-04 NOTE — Telephone Encounter (Signed)
I called pt back. Advised I sent refill for maxalt. For the oxycodone, hydrocodone, refill request will go to Dr. Jannifer Franklin. He will be back tomorrow.   She also stated she had another MRI recently at [facility]. She had this done because she was having a lot of vision changes. Her doctor there was concerned cancer came back. MRI did not show any new cancer growth. He suggested she be seen by Dr. Jannifer Franklin for visual changes. She is on vacation all next week. Scheduled appt for her to see Dr. Jannifer Franklin on 03/20/18 at 12pm.

## 2018-03-06 MED FILL — PANTOPRAZOLE SOD DR 40 MG T: 40 | 90 days supply | Qty: 90 | Fill #3

## 2018-03-06 MED FILL — MELOXICAM 7.5 MG TABLET: 7.5 | 30 days supply | Qty: 30 | Fill #3

## 2018-03-17 ENCOUNTER — Ambulatory Visit (INDEPENDENT_AMBULATORY_CARE_PROVIDER_SITE_OTHER): Payer: 59 | Admitting: Family Medicine

## 2018-03-17 ENCOUNTER — Encounter: Payer: Self-pay | Admitting: Family Medicine

## 2018-03-17 VITALS — BP 124/80 | HR 90 | Temp 99.1°F | Ht 63.5 in | Wt 224.0 lb

## 2018-03-17 DIAGNOSIS — I10 Essential (primary) hypertension: Secondary | ICD-10-CM

## 2018-03-17 DIAGNOSIS — C719 Malignant neoplasm of brain, unspecified: Secondary | ICD-10-CM | POA: Diagnosis not present

## 2018-03-17 DIAGNOSIS — Z0000 Encounter for general adult medical examination without abnormal findings: Secondary | ICD-10-CM | POA: Diagnosis not present

## 2018-03-17 DIAGNOSIS — G43709 Chronic migraine without aura, not intractable, without status migrainosus: Secondary | ICD-10-CM | POA: Diagnosis not present

## 2018-03-17 DIAGNOSIS — M79672 Pain in left foot: Secondary | ICD-10-CM | POA: Diagnosis not present

## 2018-03-17 DIAGNOSIS — M25572 Pain in left ankle and joints of left foot: Secondary | ICD-10-CM | POA: Diagnosis not present

## 2018-03-17 DIAGNOSIS — S93402A Sprain of unspecified ligament of left ankle, initial encounter: Secondary | ICD-10-CM | POA: Diagnosis not present

## 2018-03-17 DIAGNOSIS — Z23 Encounter for immunization: Secondary | ICD-10-CM | POA: Diagnosis not present

## 2018-03-17 NOTE — Assessment & Plan Note (Signed)
Reviewed preventive care protocols, scheduled due services, and updated immunizations Discussed nutrition, exercise, diet, and healthy lifestyle.  Orders Placed This Encounter  Procedures  . Tdap vaccine greater than or equal to 57yo IM  . Direct LDL  . Comprehensive metabolic panel  . TSH  . CBC with Differential/Platelet

## 2018-03-17 NOTE — Progress Notes (Signed)
Subjective:   Patient ID: [name] [name], female    DOB: Nov 03, 1960, 57 y.o.   MRN: [id number]  [name] [name] is a pleasant 57 y.o. year old female who presents to clinic today with Annual Exam (Patient is here today for a CPE.  Mammogram completed 8.15.18. She states that she fell the last 2 steps down when she was at the [place] on vacation.  She saw Ortho this am and has some fractues and sprained.  Will have PAP at Dr. Delanna Ahmadi office in August.  She agrees to get Tdap today.)  on 03/17/2018  HPI:  Patient is here today for a CPE. Mammogram completed 8.15.18. She states that she fell the last 2 steps down when she was at the [place] on vacation. She saw Ortho this am and has some fractues and sprained. Will have PAP at Dr. Delanna Ahmadi office in August. She agrees to get Tdap today. Health Maintenance  Topic Date Due  . Hepatitis C Screening  08/15/61  . HIV Screening  02/03/1976  . TETANUS/TDAP  02/03/1980  . INFLUENZA VACCINE  05/08/2018  . MAMMOGRAM  05/23/2019  . COLONOSCOPY  02/05/2022    [facility]- followed by Dr. Jannifer Franklin and Dr. Maylon Peppers.  She was having some new visual changes and other focal neurological findings.  MRI was therefore done last month which did not show any disease progression.  ? Micro seizures.  Has close follow up with both Dr. Maylon Peppers and Dr. Jannifer Franklin per pt.  Unfortunately [place] the beach [place] and broke her foot.  Went to [facility] ortho this morning- has to wear a boot for 2-3 months.  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      Current Outpatient Medications on File Prior to Visit  Medication Sig Dispense Refill  . Calcium Citrate-Vitamin D (CALCIUM CITRATE + PO) Take 1 tablet by mouth 3 (three) times daily.    . cloNIDine (CATAPRES) 0.1 MG tablet Take 0.1 mg by mouth daily.     . cyclobenzaprine (FLEXERIL) 10 MG tablet Take 10 mg by mouth 3 (three) times daily as needed for muscle spasms.    Marland Kitchen HYDROcodone-acetaminophen (NORCO/VICODIN) 5-325 MG  tablet Take 1 tablet by mouth every 6 (six) hours as needed for moderate pain. 30 tablet 0  . LORazepam (ATIVAN) 1 MG tablet Take 1 mg by mouth once as needed (When getting MRIs.).     Marland Kitchen magnesium oxide (MAG-OX) 400 MG tablet Take 400 mg by mouth 2 (two) times daily.     . meloxicam (MOBIC) 7.5 MG tablet Take 7.5 mg by mouth daily.    . metoprolol tartrate (LOPRESSOR) 25 MG tablet TAKE 1 TABLET BY MOUTH TWICE DAILY (Patient taking differently: Take 25 mg by mouth daily. ) 180 tablet 0  . metroNIDAZOLE (METROGEL) 1 % gel Apply 1 application topically daily. For rosacea with papular breakouts.  11  . oxyCODONE-acetaminophen (PERCOCET/ROXICET) 5-325 MG tablet Take 1 tablet by mouth daily as needed (migraine refractory to triptans and Vicodin). 15 tablet 0  . Oxymetazoline HCl (RHOFADE) 1 % CREA Apply 1 application topically as needed.    . pantoprazole (PROTONIX) 40 MG tablet TAKE 1 TABLET BY MOUTH ONCE DAILY 90 tablet 0  . Pediatric Multiple Vitamins (CHEWABLE MULTIPLE VITAMINS PO) Take 1 tablet by mouth 2 (two) times daily.     . rizatriptan (MAXALT) 10 MG tablet Take 1 tablet (10 mg total) by mouth 2 (two) times daily as needed for  migraine. May repeat in 2 hours if needed 10 tablet 5  . valACYclovir (VALTREX) 1000 MG tablet   3  . vitamin B-12 (CYANOCOBALAMIN) 250 MCG tablet Take 250 mcg by mouth daily.     No current facility-administered medications on file prior to visit.     Allergies  Allergen Reactions  . Shrimp [Shellfish Allergy] Nausea And Vomiting  . Morphine Other (See Comments)    Bladder Spasms (intolerance) Tolerates dilaudid, fentanyl, oxycodone, hydrocodone  . Latex Rash and Other (See Comments)    Blistering of skin  . Pseudoephedrine Palpitations and Other (See Comments)    Tolerates XR formulation  . Red Dye Hives  . Silver Rash and Other (See Comments)    Tegaderm - if left on for > 24 hr    Past Medical History:  Diagnosis Date  . Anemia   . Bladder spasms     . Brain cancer ([place]) 10/09/05   glioblastoma right temp/occipital  . Complication of anesthesia   . DDD (degenerative disc disease), lumbar   . Depression    Migraines  . GERD (gastroesophageal reflux disease)   . Glioblastoma multiforme ([place]) 2007   Followed by Dr. Vedia Coffer at [facility]  . Hypertension    benign  . Microcytic anemia    iron def  2ndary chronic blood loss  . Migraine, unspecified, without mention of intractable migraine without mention of status migrainosus   . Obesity   . Overweight(278.02)   . PVC (premature ventricular contraction)   . Rash    occasional recurrent  . Tachycardia, unspecified   . Viral meningitis     Past Surgical History:  Procedure Laterality Date  . ABDOMINAL HYSTERECTOMY    . BACK SURGERY     lumbar  . BILATERAL SALPINGOOPHORECTOMY    . BLADDER SURGERY     tack  . BRAIN SURGERY  2014   resection of glioblastoma multiform  . CESAREAN SECTION     x 2   . CYSTOCELE REPAIR    . gamma knife     tx of brain tumor  . LAPAROSCOPIC GASTRIC SLEEVE RESECTION WITH HIATAL HERNIA REPAIR N/A 09/06/2015   Procedure: LAPAROSCOPIC GASTRIC SLEEVE RESECTION WITH HIATAL HERNIA REPAIR;  Surgeon: Johnathan Hausen, MD;  Location[facility];  Service: General;  Laterality: N/A;  . PORTACATH PLACEMENT    . UPPER GI ENDOSCOPY  09/06/2015   Procedure: UPPER GI ENDOSCOPY;  Surgeon: Johnathan Hausen, MD;  Location: [facility];  Service: General;;    Family History  Problem Relation Age of Onset  . Cancer Other        tongue  . Heart disease Mother        bypass age 80  . Heart disease Father        heart failure, atrial fib  . Diabetes Father   . Liver disease Brother   . Diabetes Brother   . Diabetes Sister   . Motor neuron disease Brother   . Colon cancer Neg Hx   . Breast cancer Neg Hx     Social History   Socioeconomic History  . Marital status: Married    Spouse name: Synetta Shadow   . Number of children: 2  . Years of education: college  . Highest  education level: Not on file  Occupational History    Employer: [employer]    Comment: RN  Social Needs  . Financial resource strain: Not on file  . Food insecurity:    Worry: Not on  file    Inability: Not on file  . Transportation needs:    Medical: Not on file    Non-medical: Not on file  Tobacco Use  . Smoking status: Never Smoker  . Smokeless tobacco: Never Used  Substance and Sexual Activity  . Alcohol use: No    Alcohol/week: 0.0 oz  . Drug use: No  . Sexual activity: Yes  Lifestyle  . Physical activity:    Days per week: Not on file    Minutes per session: Not on file  . Stress: Not on file  Relationships  . Social connections:    Talks on phone: Not on file    Gets together: Not on file    Attends religious service: Not on file    Active member of club or organization: Not on file    Attends meetings of clubs or organizations: Not on file    Relationship status: Not on file  . Intimate partner violence:    Fear of current or ex partner: Not on file    Emotionally abused: Not on file    Physically abused: Not on file    Forced sexual activity: Not on file  Other Topics Concern  . Not on file  Social History Narrative   Patient is married and lives at home with her husband Synetta Shadow).   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   Right handed.   Caffeine one mountain dew diet.    The PMH, PSH, Social History, Family History, Medications, and allergies have been reviewed in [facility], and have been updated if relevant.   Review of Systems  Constitutional: Negative.   HENT: Negative.   Eyes: Positive for visual disturbance.  Respiratory: Negative.   Cardiovascular: Negative.   Gastrointestinal: Negative.   Endocrine: Negative.   Genitourinary: Negative.   Musculoskeletal: Positive for arthralgias.  Neurological: Positive for speech difficulty and headaches. Negative for dizziness, tremors, seizures, syncope, facial asymmetry, weakness,  light-headedness and numbness.  Psychiatric/Behavioral: Negative.   All other systems reviewed and are negative.      Objective:    BP 124/80 (BP Location: Right Arm, Patient Position: Sitting, Cuff Size: Large)   Pulse 90   Temp 99.1 F (37.3 C) (Oral)   Ht 5' 3.5" (1.613 m)   Wt 224 lb (101.6 kg)   SpO2 98%   BMI 39.06 kg/m    Physical Exam   General:  Well-developed,well-nourished,in no acute distress; alert,appropriate and cooperative throughout examination Head:  normocephalic and atraumatic.   Eyes:  vision grossly intact, PERRL Ears:  R ear normal and L ear normal externally, TMs clear bilaterally Nose:  no external deformity.   Mouth:  good dentition.   Neck:  No deformities, masses, or tenderness noted. Lungs:  Normal respiratory effort, chest expands symmetrically. Lungs are clear to auscultation, no crackles or wheezes. Heart:  Normal rate and regular rhythm. S1 and S2 normal without gallop, murmur, click, rub or other extra sounds. Abdomen:  Bowel sounds positive,abdomen soft and non-tender without masses, organomegaly or hernias noted. Msk:  No deformity or scoliosis noted of thoracic or lumbar spine.   Extremities:  Left foot in a boot.   Neurologic:  alert & oriented X3 and gait normal.   Skin:  Intact without suspicious lesions or rashes Psych:  Cognition and judgment appear intact. Alert and cooperative with normal attention span and concentration. No apparent delusions, illusions, hallucinations      Assessment & Plan:   Malignant  neoplasm of brain, unspecified location [facility])  Well woman exam without gynecological exam  Chronic migraine without aura without status migrainosus, not intractable  Need for Tdap vaccination - Plan: Tdap vaccine greater than or equal to 7yo IM No follow-ups on file.

## 2018-03-17 NOTE — Assessment & Plan Note (Signed)
[facility]. Diagnosed over 12 years ago and doing well.  Having new neurological symptoms that are being followed closely by neurology and oncology.

## 2018-03-17 NOTE — Patient Instructions (Signed)
Great to see you. I will call you with your lab results from today and you can view them online.   Keep me updated with what Dr. Jannifer Franklin says.

## 2018-03-18 LAB — CBC WITH DIFFERENTIAL/PLATELET
BASOS ABS: 0 10*3/uL (ref 0.0–0.1)
BASOS PCT: 0.4 % (ref 0.0–3.0)
EOS ABS: 0.1 10*3/uL (ref 0.0–0.7)
Eosinophils Relative: 1.9 % (ref 0.0–5.0)
HCT: 39.7 % (ref 36.0–46.0)
Hemoglobin: 13.4 g/dL (ref 12.0–15.0)
LYMPHS ABS: 2 10*3/uL (ref 0.7–4.0)
Lymphocytes Relative: 27.8 % (ref 12.0–46.0)
MCHC: 33.8 g/dL (ref 30.0–36.0)
MCV: 87 fl (ref 78.0–100.0)
MONOS PCT: 6.4 % (ref 3.0–12.0)
Monocytes Absolute: 0.5 10*3/uL (ref 0.1–1.0)
NEUTROS ABS: 4.5 10*3/uL (ref 1.4–7.7)
NEUTROS PCT: 63.5 % (ref 43.0–77.0)
PLATELETS: 282 10*3/uL (ref 150.0–400.0)
RBC: 4.56 Mil/uL (ref 3.87–5.11)
RDW: 12.8 % (ref 11.5–15.5)
WBC: 7.1 10*3/uL (ref 4.0–10.5)

## 2018-03-18 LAB — LDL CHOLESTEROL, DIRECT: LDL DIRECT: 114 mg/dL

## 2018-03-18 LAB — COMPREHENSIVE METABOLIC PANEL
ALK PHOS: 95 U/L (ref 39–117)
ALT: 14 U/L (ref 0–35)
AST: 17 U/L (ref 0–37)
Albumin: 4 g/dL (ref 3.5–5.2)
BILIRUBIN TOTAL: 0.4 mg/dL (ref 0.2–1.2)
BUN: 14 mg/dL (ref 6–23)
CO2: 26 mEq/L (ref 19–32)
Calcium: 9.5 mg/dL (ref 8.4–10.5)
Chloride: 106 mEq/L (ref 96–112)
Creatinine, Ser: 0.88 mg/dL (ref 0.40–1.20)
GFR: 70.36 mL/min (ref >60.00)
GLUCOSE: 119 mg/dL — AB (ref 70–99)
Potassium: 3.9 mEq/L (ref 3.5–5.1)
SODIUM: 142 meq/L (ref 135–145)
TOTAL PROTEIN: 6.3 g/dL (ref 6.0–8.3)

## 2018-03-18 LAB — TSH: TSH: 0.76 u[IU]/mL (ref 0.35–4.50)

## 2018-03-19 ENCOUNTER — Ambulatory Visit: Payer: 59 | Admitting: Skilled Nursing Facility1

## 2018-03-20 ENCOUNTER — Encounter: Payer: Self-pay | Admitting: Neurology

## 2018-03-20 ENCOUNTER — Ambulatory Visit: Payer: 59 | Admitting: Neurology

## 2018-03-20 VITALS — BP 121/69 | HR 67 | Ht 63.5 in | Wt 228.0 lb

## 2018-03-20 DIAGNOSIS — C719 Malignant neoplasm of brain, unspecified: Secondary | ICD-10-CM | POA: Diagnosis not present

## 2018-03-20 DIAGNOSIS — G43709 Chronic migraine without aura, not intractable, without status migrainosus: Secondary | ICD-10-CM

## 2018-03-20 MED ORDER — DIVALPROEX SODIUM ER 500 MG PO TB24: ORAL_TABLET | ORAL | 3 refills | Status: DC

## 2018-03-20 NOTE — Progress Notes (Signed)
Reason for visit: Headaches  [name] [name] is an 57 y.o. female  History of present illness:  [name] [name] is a 57 year old right-handed white female with a history of glioblastoma.  The patient has a history of migraine headaches as well, she in the past has been on Depakote with some benefit, she could not tolerate Topamax.  She has begun having over the last several weeks increased frequency of headaches, having about 10 headache days a month, she has had daily episodes of bright flashing lights in the vision that may be present in the morning, and may occur once or twice later in the day, but the episodes are daily in nature.  She is just recently has undergone a repeat MRI of the brain done on 24 Feb 2018, this did not show evidence of recurrent tumor.  The patient unfortunately recently fell when at the beach on vacation, she has strained her left ankle.  The patient takes Maxalt for the headaches with some modest benefit, in the past Imitrex has increased her headache.  She returns to this office for an evaluation.  Past Medical History:  Diagnosis Date  . Anemia   . Bladder spasms   . Brain cancer ([place]) 10/09/05   glioblastoma right temp/occipital  . Complication of anesthesia   . DDD (degenerative disc disease), lumbar   . Depression    Migraines  . GERD (gastroesophageal reflux disease)   . Glioblastoma multiforme ([place]) 2007   Followed by Dr. Vedia Coffer at [facility]  . Hypertension    benign  . Microcytic anemia    iron def  2ndary chronic blood loss  . Migraine, unspecified, without mention of intractable migraine without mention of status migrainosus   . Obesity   . Overweight(278.02)   . PVC (premature ventricular contraction)   . Rash    occasional recurrent  . Tachycardia, unspecified   . Viral meningitis     Past Surgical History:  Procedure Laterality Date  . ABDOMINAL HYSTERECTOMY    . BACK SURGERY     lumbar  . BILATERAL SALPINGOOPHORECTOMY    . BLADDER  SURGERY     tack  . BRAIN SURGERY  2014   resection of glioblastoma multiform  . CESAREAN SECTION     x 2   . CYSTOCELE REPAIR    . gamma knife     tx of brain tumor  . LAPAROSCOPIC GASTRIC SLEEVE RESECTION WITH HIATAL HERNIA REPAIR N/A 09/06/2015   Procedure: LAPAROSCOPIC GASTRIC SLEEVE RESECTION WITH HIATAL HERNIA REPAIR;  Surgeon: Johnathan Hausen, MD;  Location: [facility];  Service: General;  Laterality: N/A;  . PORTACATH PLACEMENT    . UPPER GI ENDOSCOPY  09/06/2015   Procedure: UPPER GI ENDOSCOPY;  Surgeon: Johnathan Hausen, MD;  Location: [facility];  Service: General;;    Family History  Problem Relation Age of Onset  . Cancer Other        tongue  . Heart disease Mother        bypass age 24  . Heart disease Father        heart failure, atrial fib  . Diabetes Father   . Liver disease Brother   . Diabetes Brother   . Diabetes Sister   . Motor neuron disease Brother   . Colon cancer Neg Hx   . Breast cancer Neg Hx     Social history:  reports that she has never smoked. She has never used smokeless tobacco. She reports that she does  not drink alcohol or use drugs.    Allergies  Allergen Reactions  . Shrimp [Shellfish Allergy] Nausea And Vomiting  . Morphine Other (See Comments)    Bladder Spasms (intolerance) Tolerates dilaudid, fentanyl, oxycodone, hydrocodone  . Latex Rash and Other (See Comments)    Blistering of skin  . Pseudoephedrine Palpitations and Other (See Comments)    Tolerates XR formulation  . Red Dye Hives  . Silver Rash and Other (See Comments)    Tegaderm - if left on for > 24 hr    Medications:  Prior to Admission medications   Medication Sig Start Date End Date Taking? Authorizing Provider  Calcium Citrate-Vitamin D (CALCIUM CITRATE + PO) Take 1 tablet by mouth 3 (three) times daily.   Yes [provider]  cloNIDine (CATAPRES) 0.1 MG tablet Take 0.1 mg by mouth daily.    Yes [provider]  cyclobenzaprine (FLEXERIL) 10 MG tablet  Take 10 mg by mouth 3 (three) times daily as needed for muscle spasms.   Yes [provider]  HYDROcodone-acetaminophen (NORCO/VICODIN) 5-325 MG tablet Take 1 tablet by mouth every 6 (six) hours as needed for moderate pain. 03/04/18  Yes Kathrynn Ducking, MD  LORazepam (ATIVAN) 1 MG tablet Take 1 mg by mouth once as needed (When getting MRIs.).    Yes [provider]  magnesium oxide (MAG-OX) 400 MG tablet Take 400 mg by mouth 2 (two) times daily.    Yes [provider]  meloxicam (MOBIC) 7.5 MG tablet Take 7.5 mg by mouth daily.   Yes [provider]  metoprolol tartrate (LOPRESSOR) 25 MG tablet TAKE 1 TABLET BY MOUTH TWICE DAILY Patient taking differently: Take 25 mg by mouth daily.  05/28/14  Yes Lucille Passy, MD  metroNIDAZOLE (METROGEL) 1 % gel Apply 1 application topically daily. For rosacea with papular breakouts. 04/13/16  Yes [provider]  oxyCODONE-acetaminophen (PERCOCET/ROXICET) 5-325 MG tablet Take 1 tablet by mouth daily as needed (migraine refractory to triptans and Vicodin). 03/04/18  Yes Kathrynn Ducking, MD  Oxymetazoline HCl (RHOFADE) 1 % CREA Apply 1 application topically as needed.   Yes [provider]  pantoprazole (PROTONIX) 40 MG tablet TAKE 1 TABLET BY MOUTH ONCE DAILY 10/18/15  Yes Lucille Passy, MD  Pediatric Multiple Vitamins (CHEWABLE MULTIPLE VITAMINS PO) Take 1 tablet by mouth 2 (two) times daily.    Yes [provider]  rizatriptan (MAXALT) 10 MG tablet Take 1 tablet (10 mg total) by mouth 2 (two) times daily as needed for migraine. May repeat in 2 hours if needed 03/04/18  Yes Kathrynn Ducking, MD  valACYclovir (VALTREX) 1000 MG tablet As needed for cold sores 02/13/18  Yes [provider]  vitamin B-12 (CYANOCOBALAMIN) 250 MCG tablet Take 250 mcg by mouth daily.   Yes [provider]    ROS:  Out of a complete 14 system review of symptoms, the patient complains only of the following  symptoms, and all other reviewed systems are negative.  Light sensitivity Flushing Environmental allergies Headache Frequent waking  Blood pressure 121/69, pulse 67, height 5' 3.5" (1.613 m), weight 228 lb (103.4 kg).  Physical Exam  General: The patient is alert and cooperative at the time of the examination.  The patient is moderately obese.  Skin: No significant peripheral edema is noted.  The patient has a boot on the left foot and ankle.  Neurologic Exam  Mental status: The patient is alert and oriented x 3 at  the time of the examination. The patient has apparent normal recent and remote memory, with an apparently normal attention span and concentration ability.   Cranial nerves: Facial symmetry is present. Speech is normal, no aphasia or dysarthria is noted. Extraocular movements are full. Visual fields are full.  Motor: The patient has good strength in all 4 extremities.  Sensory examination: Soft touch sensation is symmetric on the face, arms, and legs.  Coordination: The patient has good finger-nose-finger and heel-to-shin bilaterally.  Gait and station: The patient has a limping gait on the left leg.  Reflexes: Deep tendon reflexes are symmetric.   MRI brain 02/24/18:  1.Postsurgical changes of right parieto-occipital glioma resection with similar appearance of the resection cavity compared to the prior exam. Mild nodular enhancement along the posteromedial margin of the resection cavity is unchanged. Attention on follow-up. 2.No substantial change in meningiomas along the right temporal horn and inferior left frontal lobe.   Assessment/Plan:  1.  Migraine headache  2.  Glioblastoma  The patient has had increased frequency of headache and visual aura.  There is a possibility that the visual aura may be occipital seizures, but they likely are related to migraine.  The patient will be placed on Depakote taking 500 mg daily for 2 weeks and go to 1000 mg at  night.  She will follow-up in 3 to 4 months.  The patient will call for any dose adjustments.  In the past, Depakote has been beneficial.   Jill Alexanders MD 03/20/2018 12:16 PM  [facility] [address] [place], [place] [address]  Phone [phone] Fax [phone]

## 2018-03-20 NOTE — Patient Instructions (Signed)
We will start depakote 500 mg at night for 2 weeks, then go to 1000 mg daily.  Depakote (valproic acid) is a seizure medication that also has an FDA approval for migraine headache. The most common potential side effects of this medication include weight gain, tremor, or possible stomach upset. This medication can potentially cause liver problems. If confusion is noted on this medication, contact our office immediately.

## 2018-03-24 ENCOUNTER — Encounter: Payer: 59 | Attending: Surgery | Admitting: Skilled Nursing Facility1

## 2018-03-24 ENCOUNTER — Encounter: Payer: Self-pay | Admitting: Skilled Nursing Facility1

## 2018-03-24 DIAGNOSIS — E669 Obesity, unspecified: Secondary | ICD-10-CM | POA: Insufficient documentation

## 2018-03-24 DIAGNOSIS — Z713 Dietary counseling and surveillance: Secondary | ICD-10-CM | POA: Diagnosis not present

## 2018-03-24 NOTE — Progress Notes (Signed)
Post-Operative Sleeve gastrectomy Surgery  Medical Nutrition Therapy:  Appt start time: 325 end time:  405  Primary concerns today: Post-operative Bariatric Surgery Nutrition Management.   Pt states she has some mciroseizures and migraines now was worried it was tumor regrowth but that has not been proven and has started on Depakote. Pt states her Father in law died, still watching ehr mother, needing new job can't keep up with her current one. Pt states she is snacking throughout the day. Pt states she is going to start coming to the nutrition appointment due to Accounabilitty. Pt states she Can throw up very easily when she has overeaten, that is not a concern for her. . Pt states she Hates to grocery shop. Pt states she needs to make changes at home stating her husband made her feel guilt for throwing up every time she overate and for losing weight and for caring for her mother. Pt states she feel and is now wearing a boot for a few weeks. Pt state she knows what to do.  Surgery date: 09/06/15 Surgery type: sleeve gastrectomy Start weight at [facility]: 285 lbs on 06/08/15 (highest weight 297 lbs) Weight today: 211 lbs  TANITA  BODY COMP RESULTS  09/06/16 01/08/2017 0723/2018   BMI (kg/m^2) 36.5 35.5 36.8   Fat Mass (lbs) 81.4 82.2 93.6   Fat Free Mass (lbs) 124.8 121.4 117.4   Total Body Water (lbs) 89.4 87 84.2    24-hr recall: B (AM): Eggs and toast or pack of crackers and sausage (14g) Or protein shake (30g) or nut and fruit bar Snk (AM): meat and nuts L (PM): 3 oz meat and vegetable (21g) Snk (PM): popcorn D (PM): 3 oz meat and vegetable or salad (21g) OR pasta or fried fish Snk (PM): not usually  Fluid intake: about 64+ oz Crystal Light, Nature's Twist, diet mountain dew (increased from before), unsweet tea Estimated total protein intake: 53-73g  Medications: see list Supplementation: multivitamin, calcium, magnesium, vitamin d,   Using straws: no Drinking while eating: not  usually; sometimes sips Hair loss: slowing down Carbonated beverages: has diet mountain dew N/V/D/C: irregular bowel movements, 1 every 2-3 days; sometimes regurgitation if she eats too much Dumping syndrome: none  Recent physical activity:  30 minutes 3-4 days a week  Progress Towards Goal(s):  In progress.  Handouts given during visit include:  none   Nutritional Diagnosis:  [place]-3.3 Overweight/obesity related to past poor dietary habits and physical inactivity as evidenced by patient w/ recent sleeve gatsrectomy surgery following dietary guidelines for continued weight loss.  Intervention:  Nutrition counseling provided. Goals: -use your meal ideas sheet to put balanced meals together  -focus on your meals -control your emotional eating and increased snacking  Teaching Method Utilized:  Visual Auditory Hands on  Barriers to learning/adherence to lifestyle change: emotional eating  Demonstrated degree of understanding via:  Teach Back   Monitoring/Evaluation:  Dietary intake, exercise, and body weight.

## 2018-03-31 DIAGNOSIS — M25572 Pain in left ankle and joints of left foot: Secondary | ICD-10-CM | POA: Diagnosis not present

## 2018-03-31 DIAGNOSIS — M79672 Pain in left foot: Secondary | ICD-10-CM | POA: Diagnosis not present

## 2018-03-31 DIAGNOSIS — S93402A Sprain of unspecified ligament of left ankle, initial encounter: Secondary | ICD-10-CM | POA: Diagnosis not present

## 2018-04-22 ENCOUNTER — Encounter: Payer: Self-pay | Admitting: Family Medicine

## 2018-04-22 DIAGNOSIS — S93402A Sprain of unspecified ligament of left ankle, initial encounter: Secondary | ICD-10-CM | POA: Diagnosis not present

## 2018-04-22 DIAGNOSIS — M25572 Pain in left ankle and joints of left foot: Secondary | ICD-10-CM | POA: Diagnosis not present

## 2018-04-24 DIAGNOSIS — S93402A Sprain of unspecified ligament of left ankle, initial encounter: Secondary | ICD-10-CM | POA: Diagnosis not present

## 2018-04-28 MED FILL — RHOFADE 1% CREAM: 1 | 30 days supply | Qty: 30 | Fill #0

## 2018-04-28 MED FILL — DIVALPROEX SOD ER 500 MG TA: 500 | 30 days supply | Qty: 60 | Fill #0

## 2018-04-28 MED FILL — metroNIDAZOLE 1 % GEL: 1 | 30 days supply | Qty: 60 | Fill #1

## 2018-04-28 MED FILL — CloNIDine HCL 0.1 MG TAB: 0.1 | 30 days supply | Qty: 60 | Fill #5

## 2018-04-28 MED FILL — CYCLOBENZAPRINE HCL 10 MG T: 10 | 20 days supply | Qty: 60 | Fill #0

## 2018-04-28 MED FILL — MELOXICAM 7.5 MG TABLET: 7.5 | 30 days supply | Qty: 30 | Fill #0

## 2018-05-19 DIAGNOSIS — B351 Tinea unguium: Secondary | ICD-10-CM | POA: Diagnosis not present

## 2018-05-19 DIAGNOSIS — L719 Rosacea, unspecified: Secondary | ICD-10-CM | POA: Diagnosis not present

## 2018-05-19 DIAGNOSIS — I781 Nevus, non-neoplastic: Secondary | ICD-10-CM | POA: Diagnosis not present

## 2018-05-20 DIAGNOSIS — M25572 Pain in left ankle and joints of left foot: Secondary | ICD-10-CM | POA: Diagnosis not present

## 2018-05-20 DIAGNOSIS — S93402A Sprain of unspecified ligament of left ankle, initial encounter: Secondary | ICD-10-CM | POA: Diagnosis not present

## 2018-05-20 DIAGNOSIS — M79672 Pain in left foot: Secondary | ICD-10-CM | POA: Diagnosis not present

## 2018-05-30 ENCOUNTER — Other Ambulatory Visit: Payer: Self-pay | Admitting: Obstetrics and Gynecology

## 2018-05-30 DIAGNOSIS — Z1231 Encounter for screening mammogram for malignant neoplasm of breast: Secondary | ICD-10-CM

## 2018-06-20 MED FILL — CYCLOBENZAPRINE HCL 10 MG T: 10 | 20 days supply | Qty: 60 | Fill #1

## 2018-06-20 MED FILL — RHOFADE 1% CREAM: 1 | 30 days supply | Qty: 30 | Fill #1

## 2018-06-20 MED FILL — MELOXICAM 7.5 MG TABLET: 7.5 | 30 days supply | Qty: 30 | Fill #1

## 2018-06-20 MED FILL — metroNIDAZOLE 1 % GEL: 1 | 30 days supply | Qty: 60 | Fill #0

## 2018-06-20 MED FILL — CloNIDine HCL 0.1 MG TAB: 0.1 | 30 days supply | Qty: 60 | Fill #0

## 2018-06-20 MED FILL — valACYclovir HCL 1 GM TABS: 1 | 30 days supply | Qty: 30 | Fill #2

## 2018-06-23 DIAGNOSIS — H524 Presbyopia: Secondary | ICD-10-CM | POA: Diagnosis not present

## 2018-06-23 DIAGNOSIS — H5203 Hypermetropia, bilateral: Secondary | ICD-10-CM | POA: Diagnosis not present

## 2018-06-23 MED FILL — PANTOPRAZOLE SOD DR 40 MG T: 40 | 90 days supply | Qty: 90 | Fill #0

## 2018-06-23 MED FILL — METOPROLOL TARTRATE 25 MG T: 25 | 90 days supply | Qty: 180 | Fill #0

## 2018-06-24 ENCOUNTER — Ambulatory Visit: Payer: 59 | Admitting: Skilled Nursing Facility1

## 2018-06-25 ENCOUNTER — Ambulatory Visit
Admission: RE | Admit: 2018-06-25 | Discharge: 2018-06-25 | Disposition: A | Discharge status: Home / Self Care | Payer: 59 | Source: Ambulatory Visit | Attending: Obstetrics and Gynecology | Admitting: Obstetrics and Gynecology

## 2018-06-25 DIAGNOSIS — Z1231 Encounter for screening mammogram for malignant neoplasm of breast: Secondary | ICD-10-CM | POA: Diagnosis not present

## 2018-06-30 ENCOUNTER — Encounter: Payer: Self-pay | Admitting: Skilled Nursing Facility1

## 2018-06-30 ENCOUNTER — Encounter: Payer: 59 | Attending: Surgery | Admitting: Skilled Nursing Facility1

## 2018-06-30 DIAGNOSIS — Z6839 Body mass index (BMI) 39.0-39.9, adult: Secondary | ICD-10-CM | POA: Diagnosis not present

## 2018-06-30 DIAGNOSIS — Z713 Dietary counseling and surveillance: Secondary | ICD-10-CM | POA: Insufficient documentation

## 2018-06-30 DIAGNOSIS — E669 Obesity, unspecified: Secondary | ICD-10-CM

## 2018-06-30 NOTE — Patient Instructions (Signed)
-  Do not drink unsweet tea with alternative sugar and do not drink diet mountain dew for at minimum 30 days    -Do add to your water: frozen berries, sliced oranges, sliced cucumbers, cinnamon stick, basil, lemon juice, lime juice, sliced lemon, sliced lime   -Get some healthy snacks

## 2018-06-30 NOTE — Progress Notes (Signed)
Post-Operative Sleeve gastrectomy Surgery  Medical Nutrition Therapy:  Appt start time: 325 end time:  405  Primary concerns today: Post-operative Bariatric Surgery Nutrition Management.   Pt states she is surprised she has not gained more weight due to her ankle pain not being able to move she wants. Pt states she has been very stressed which has caused her to emotionally eat. Pt states her husband will not stop buying the "goodies". Pt states her husband does not want her to grocery shop because she gets foods he does not want. Pt states her and her husband but heads with foods. Pt states her mothers hearing is getting worse and her mother would only eat certain things (she is at her house every other weekend) and visits her husband mother every weekend. Pt states she has been eating out a lot more often.    Surgery date: 09/06/15 Surgery type: sleeve gastrectomy [facility] weight at [facility]: 285 lbs on 06/08/15 (highest weight 297 lbs) Weight today: 226.4   TANITA  BODY COMP RESULTS  09/06/16 01/08/2017 0723/2018 06/30/2018   BMI (kg/m^2) 36.5 35.5 36.8 39.5   Fat Mass (lbs) 81.4 82.2 93.6 101.8   Fat Free Mass (lbs) 124.8 121.4 117.4 124.6   Total Body Water (lbs) 89.4 87 84.2 90    24-hr recall: B (AM): Eggs and toast or pack of crackers and sausage (14g) Or protein shake (30g) or nut and fruit bar Snk (AM): meat and nuts L (PM): 3 oz meat and vegetable (21g) Snk (PM): popcorn D (PM): 3 oz meat and vegetable or salad (21g) OR pasta or fried fish Snk (PM): not usually  Fluid intake: about 64+ oz Crystal Light, Nature's Twist, diet mountain dew (increased from before), unsweet tea Estimated total protein intake: 53-73g  Medications: see list Supplementation: multivitamin, b12, calcium, magnesium, vitamin d  Using straws: no Drinking while eating: not usually; sometimes sips Hair loss: slowing down Carbonated beverages: has diet mountain dew N/V/D/C: irregular bowel movements, 1 every  2-3 days; sometimes regurgitation if she eats too much Dumping syndrome: none  Recent physical activity:  30 minutes 3-4 days a week  Progress Towards Goal(s):  In progress.  Handouts given during visit include:  none   Nutritional Diagnosis:  [place]-3.3 Overweight/obesity related to past poor dietary habits and physical inactivity as evidenced by patient w/ recent sleeve gatsrectomy surgery following dietary guidelines for continued weight loss.  Intervention:  Nutrition counseling provided. Goals: -Do not drink unsweet tea with alternative sugar and do not drink diet mountain dew for at minimum 30 days  -Do add to your water: frozen berries, sliced oranges, sliced cucumbers, cinnamon stick, basil, lemon juice, lime juice, sliced lemon, sliced lime  -Get some healthy snacks  Teaching Method Utilized:  Visual Auditory Hands on  Barriers to learning/adherence to lifestyle change: emotional eating  Demonstrated degree of understanding via:  Teach Back   Monitoring/Evaluation:  Dietary intake, exercise, and body weight.

## 2018-07-11 ENCOUNTER — Encounter (HOSPITAL_COMMUNITY): Payer: Self-pay

## 2018-07-11 ENCOUNTER — Encounter (HOSPITAL_COMMUNITY)
Admission: RE | Admit: 2018-07-11 | Discharge: 2018-07-11 | Disposition: A | Discharge status: Home / Self Care | Payer: 59 | Source: Ambulatory Visit | Attending: Medical Oncology | Admitting: Medical Oncology

## 2018-07-11 DIAGNOSIS — C719 Malignant neoplasm of brain, unspecified: Secondary | ICD-10-CM | POA: Diagnosis not present

## 2018-07-11 DIAGNOSIS — Z452 Encounter for adjustment and management of vascular access device: Secondary | ICD-10-CM | POA: Insufficient documentation

## 2018-07-11 MED ORDER — HEPARIN SOD (PORK) LOCK FLUSH 100 UNIT/ML IV SOLN
500.0000 [IU] | Freq: Once | INTRAVENOUS | Status: AC
  Administered 2018-07-11: 500 [IU] via INTRAVENOUS
  Filled 2018-07-11: qty 5

## 2018-07-11 MED ORDER — SODIUM CHLORIDE 0.9% FLUSH
20.0000 mL | INTRAVENOUS | Status: DC | PRN
Start: 2018-07-11 — End: 2018-07-12
  Administered 2018-07-11: 20 mL via INTRAVENOUS
  Filled 2018-07-11: qty 20

## 2018-07-22 ENCOUNTER — Ambulatory Visit: Payer: 59 | Admitting: Neurology

## 2018-07-22 ENCOUNTER — Other Ambulatory Visit: Payer: Self-pay

## 2018-07-22 ENCOUNTER — Encounter: Payer: Self-pay | Admitting: Neurology

## 2018-07-22 VITALS — BP 121/80 | HR 79 | Wt 230.0 lb

## 2018-07-22 DIAGNOSIS — G43709 Chronic migraine without aura, not intractable, without status migrainosus: Secondary | ICD-10-CM | POA: Diagnosis not present

## 2018-07-22 DIAGNOSIS — C719 Malignant neoplasm of brain, unspecified: Secondary | ICD-10-CM

## 2018-07-22 MED ORDER — HYDROCODONE-ACETAMINOPHEN 5-325 MG PO TABS: 1.0000 | ORAL_TABLET | Freq: Four times a day (QID) | ORAL | 0 refills | Status: DC | PRN

## 2018-07-22 MED FILL — HYDROCODON-APAP 5-325: 5-325 | 7 days supply | Qty: 30 | Fill #0

## 2018-07-22 NOTE — Progress Notes (Signed)
Reason for visit: Headache  [name] [name] is an 57 y.o. female  History of present illness:  [name] [name] is a 57 year old right-handed white female with a history of a glioblastoma in the right temporal and occipital area.  The patient is still working, she operates a Teacher, music.  She was placed on Depakote for headaches and visual disturbances that were felt to be migrainous in nature.  The Depakote did essentially eliminate the visual episodes, she has not had many headaches lately, she decided to taper off of the Depakote 2 months ago, and she has continued to do well.  She is under some stress that she has to take care of parents on both sides of the family.  The patient returns to this office for an evaluation.  She will be getting another MRI of the brain in November 2019 to follow-up with her glioblastoma.  She does have some tingling sensations in the hands and feet following chemotherapy.  Past Medical History:  Diagnosis Date  . Anemia   . Bladder spasms   . Brain cancer ([place]) 10/09/05   glioblastoma right temp/occipital  . Complication of anesthesia   . DDD (degenerative disc disease), lumbar   . Depression    Migraines  . GERD (gastroesophageal reflux disease)   . Glioblastoma multiforme ([place]) 2007   Follow[facility] by Dr. Vedia Coffer at [facility]  . Hypertension    benign  . Microcytic anemia    iron def  2ndary chronic blood loss  . Migraine, unspecified, without mention of intractable migraine without mention of status migrainosus   . Obesity   . Overweight(278.02)   . PVC (premature ventricular contraction)   . Rash    occasional recurrent  . Tachycardia, unspecified   . Viral meningitis     Past Surgical History:  Procedure Laterality Date  . ABDOMINAL HYSTERECTOMY    . BACK SURGERY     lumbar  . BILATERAL SALPINGOOPHORECTOMY    . BLADDER SURGERY     tack  . BRAIN SURGERY  2014   resection of glioblastoma multiform  . CESAREAN SECTION     x 2   .  CYSTOCELE REPAIR    . gamma knife     tx of brain tumor  . LAPAROSCOPIC GASTRIC SLEEVE RESECTION WITH HIATAL HERNIA REPAIR N/A 09/06/2015   Procedure: LAPAROSCOPIC GASTRIC SLEEVE RESECTION WITH HIATAL HERNIA REPAIR;  Surgeon: Johnathan Hausen, MD;  Location: [facility];  Service: General;  Laterality: N/A;  . PORTACATH PLACEMENT    . UPPER GI ENDOSCOPY  09/06/2015   Procedure: UPPER GI ENDOSCOPY;  Surgeon: Johnathan Hausen, MD;  Location: [facility];  Service: General;;    Family History  Problem Relation Age of Onset  . Cancer Other        tongue  . Heart disease Mother        bypass age 38  . Heart disease Father        heart failure, atrial fib  . Diabetes Father   . Liver disease Brother   . Diabetes Brother   . Diabetes Sister   . Motor neuron disease Brother   . Colon cancer Neg Hx   . Breast cancer Neg Hx     Social history:  reports that she has never smoked. She has never used smokeless tobacco. She reports that she does not drink alcohol or use drugs.    Allergies  Allergen Reactions  . Shrimp [Shellfish Allergy] Nausea And Vomiting  .  Morphine Other (See Comments)    Bladder Spasms (intolerance) Tolerates dilaudid, fentanyl, oxycodone, hydrocodone  . Latex Rash and Other (See Comments)    Blistering of skin  . Pseudoephedrine Palpitations and Other (See Comments)    Tolerates XR formulation  . Red Dye Hives  . Silver Rash and Other (See Comments)    Tegaderm - if left on for > 24 hr    Medications:  Prior to Admission medications   Medication Sig Start Date End Date Taking? Authorizing Provider  Calcium Citrate-Vitamin D (CALCIUM CITRATE + PO) Take 1 tablet by mouth 3 (three) times daily.   Yes [provider]  cloNIDine (CATAPRES) 0.1 MG tablet Take 0.1 mg by mouth daily.    Yes [provider]  cyclobenzaprine (FLEXERIL) 10 MG tablet Take 10 mg by mouth 3 (three) times daily as needed for muscle spasms.   Yes [provider]    HYDROcodone-acetaminophen (NORCO/VICODIN) 5-325 MG tablet Take 1 tablet by mouth every 6 (six) hours as needed for moderate pain. 03/04/18  Yes Kathrynn Ducking, MD  LORazepam (ATIVAN) 1 MG tablet Take 1 mg by mouth once as needed (When getting MRIs.).    Yes [provider]  magnesium oxide (MAG-OX) 400 MG tablet Take 400 mg by mouth 2 (two) times daily.    Yes [provider]  meloxicam (MOBIC) 7.5 MG tablet Take 7.5 mg by mouth daily.   Yes [provider]  metoprolol tartrate (LOPRESSOR) 25 MG tablet TAKE 1 TABLET BY MOUTH TWICE DAILY Patient taking differently: Take 25 mg by mouth daily.  05/28/14  Yes Lucille Passy, MD  metroNIDAZOLE (METROGEL) 1 % gel Apply 1 application topically daily. For rosacea with papular breakouts. 04/13/16  Yes [provider]  oxyCODONE-acetaminophen (PERCOCET/ROXICET) 5-325 MG tablet Take 1 tablet by mouth daily as needed (migraine refractory to triptans and Vicodin). 03/04/18  Yes Kathrynn Ducking, MD  Oxymetazoline HCl (RHOFADE) 1 % CREA Apply 1 application topically as needed.   Yes [provider]  pantoprazole (PROTONIX) 40 MG tablet TAKE 1 TABLET BY MOUTH ONCE DAILY 10/18/15  Yes Lucille Passy, MD  Pediatric Multiple Vitamins (CHEWABLE MULTIPLE VITAMINS PO) Take 1 tablet by mouth 2 (two) times daily.    Yes [provider]  rizatriptan (MAXALT) 10 MG tablet Take 1 tablet (10 mg total) by mouth 2 (two) times daily as needed for migraine. May repeat in 2 hours if needed 03/04/18  Yes Kathrynn Ducking, MD  valACYclovir (VALTREX) 1000 MG tablet As needed for cold sores 02/13/18  Yes [provider]  vitamin B-12 (CYANOCOBALAMIN) 250 MCG tablet Take 250 mcg by mouth daily.   Yes [provider]    ROS:  Out of a complete 14 system review of symptoms, the patient complains only of the following symptoms, and all other reviewed systems are negative.  Flushing Headache, memory loss  Blood  pressure 121/80, pulse 79, weight 230 lb (104.3 kg), SpO2 98 %.  Physical Exam  General: The patient is alert and cooperative at the time of the examination.  This patient is markedly obese.  Skin: No significant peripheral edema is noted.   Neurologic Exam  Mental status: The patient is alert and oriented x 3 at the time of the examination. The patient has apparent normal recent and remote memory, with an apparently normal attention span and concentration ability.   Cranial nerves: Facial symmetry is present. Speech is normal, no aphasia or dysarthria is  noted. Extraocular movements are full. Visual fields are full.  Motor: The patient has good strength in all 4 extremities.  Sensory examination: Soft touch sensation is symmetric on the face, arms, and legs.  Coordination: The patient has good finger-nose-finger and heel-to-shin bilaterally.  Gait and station: The patient has a normal gait. Tandem gait is normal. Romberg is negative. No drift is seen.  Reflexes: Deep tendon reflexes are symmetric.   Assessment/Plan:  1.  Migraine headache  2.  Glioblastoma  The patient is doing remarkably well with her headaches currently.  She will remain off of Depakote for now but if the headaches return, she can restart this.  The patient will follow-up in 6 months, sooner if needed.  A prescription was given for her hydrocodone.  Jill Alexanders MD 07/22/2018 2:59 PM  [facility] [address] [place], [place] [address]  Phone [phone] Fax [phone]

## 2018-07-25 MED FILL — MELOXICAM 7.5 MG TABLET: 7.5 | 30 days supply | Qty: 30 | Fill #2

## 2018-08-11 DIAGNOSIS — C719 Malignant neoplasm of brain, unspecified: Secondary | ICD-10-CM | POA: Diagnosis not present

## 2018-08-11 DIAGNOSIS — Z85841 Personal history of malignant neoplasm of brain: Secondary | ICD-10-CM | POA: Diagnosis not present

## 2018-08-11 DIAGNOSIS — Z9889 Other specified postprocedural states: Secondary | ICD-10-CM | POA: Diagnosis not present

## 2018-08-11 DIAGNOSIS — Z959 Presence of cardiac and vascular implant and graft, unspecified: Secondary | ICD-10-CM | POA: Diagnosis not present

## 2018-08-11 DIAGNOSIS — C712 Malignant neoplasm of temporal lobe: Secondary | ICD-10-CM | POA: Diagnosis not present

## 2018-08-11 DIAGNOSIS — G43909 Migraine, unspecified, not intractable, without status migrainosus: Secondary | ICD-10-CM | POA: Diagnosis not present

## 2018-08-11 DIAGNOSIS — Z91040 Latex allergy status: Secondary | ICD-10-CM | POA: Diagnosis not present

## 2018-08-11 DIAGNOSIS — R220 Localized swelling, mass and lump, head: Secondary | ICD-10-CM | POA: Diagnosis not present

## 2018-08-11 DIAGNOSIS — Z888 Allergy status to other drugs, medicaments and biological substances status: Secondary | ICD-10-CM | POA: Diagnosis not present

## 2018-08-11 DIAGNOSIS — Z885 Allergy status to narcotic agent status: Secondary | ICD-10-CM | POA: Diagnosis not present

## 2018-08-11 DIAGNOSIS — Z9884 Bariatric surgery status: Secondary | ICD-10-CM | POA: Diagnosis not present

## 2018-08-19 DIAGNOSIS — Z01419 Encounter for gynecological examination (general) (routine) without abnormal findings: Secondary | ICD-10-CM | POA: Diagnosis not present

## 2018-08-19 DIAGNOSIS — Z6841 Body Mass Index (BMI) 40.0 and over, adult: Secondary | ICD-10-CM | POA: Diagnosis not present

## 2018-08-25 MED FILL — CYCLOBENZAPRINE HCL 10 MG T: 10 | 20 days supply | Qty: 60 | Fill #2

## 2018-08-25 MED FILL — MELOXICAM 7.5 MG TABLET: 7.5 | 30 days supply | Qty: 30 | Fill #3

## 2018-08-25 MED FILL — RHOFADE 1% CREAM: 1 | 30 days supply | Qty: 30 | Fill #2

## 2018-09-30 MED FILL — PANTOPRAZOLE SOD DR 40 MG T: 40 | 90 days supply | Qty: 90 | Fill #1

## 2018-09-30 MED FILL — CYCLOBENZAPRINE HCL 10 MG T: 10 | 20 days supply | Qty: 60 | Fill #3

## 2018-09-30 MED FILL — CloNIDine HCL 0.1 MG TAB: 0.1 | 30 days supply | Qty: 60 | Fill #1

## 2018-10-02 MED FILL — RHOFADE 1% CREAM: 1 | 30 days supply | Qty: 30 | Fill #0

## 2018-10-03 MED FILL — MELOXICAM 7.5 MG TABLET: 7.5 | 30 days supply | Qty: 30 | Fill #0

## 2018-10-07 ENCOUNTER — Ambulatory Visit: Payer: 59 | Admitting: Skilled Nursing Facility1

## 2018-10-16 DIAGNOSIS — H538 Other visual disturbances: Secondary | ICD-10-CM | POA: Diagnosis not present

## 2018-10-16 DIAGNOSIS — C718 Malignant neoplasm of overlapping sites of brain: Secondary | ICD-10-CM | POA: Diagnosis not present

## 2018-10-16 DIAGNOSIS — Z872 Personal history of diseases of the skin and subcutaneous tissue: Secondary | ICD-10-CM | POA: Diagnosis not present

## 2018-10-16 DIAGNOSIS — Z9884 Bariatric surgery status: Secondary | ICD-10-CM | POA: Diagnosis not present

## 2018-10-16 DIAGNOSIS — Z9889 Other specified postprocedural states: Secondary | ICD-10-CM | POA: Diagnosis not present

## 2018-10-16 DIAGNOSIS — R51 Headache: Secondary | ICD-10-CM | POA: Diagnosis not present

## 2018-10-16 DIAGNOSIS — Z85841 Personal history of malignant neoplasm of brain: Secondary | ICD-10-CM | POA: Diagnosis not present

## 2018-10-16 DIAGNOSIS — C712 Malignant neoplasm of temporal lobe: Secondary | ICD-10-CM | POA: Diagnosis not present

## 2018-10-16 DIAGNOSIS — K219 Gastro-esophageal reflux disease without esophagitis: Secondary | ICD-10-CM | POA: Diagnosis not present

## 2018-10-16 DIAGNOSIS — Z08 Encounter for follow-up examination after completed treatment for malignant neoplasm: Secondary | ICD-10-CM | POA: Diagnosis not present

## 2018-10-16 DIAGNOSIS — Z8739 Personal history of other diseases of the musculoskeletal system and connective tissue: Secondary | ICD-10-CM | POA: Diagnosis not present

## 2018-10-16 DIAGNOSIS — R296 Repeated falls: Secondary | ICD-10-CM | POA: Diagnosis not present

## 2018-10-17 ENCOUNTER — Other Ambulatory Visit: Payer: Self-pay | Admitting: Neurology

## 2018-10-17 MED ORDER — ONDANSETRON HCL 4 MG PO TABS: 4.0000 mg | ORAL_TABLET | Freq: Three times a day (TID) | ORAL | 1 refills | Status: DC | PRN

## 2018-10-17 MED ORDER — HYDROCODONE-ACETAMINOPHEN 5-325 MG PO TABS: 1.0000 | ORAL_TABLET | Freq: Four times a day (QID) | ORAL | 0 refills | Status: DC | PRN

## 2018-10-17 MED FILL — HYDROCODON-APAP 5-325: 5-325 | 7 days supply | Qty: 30 | Fill #0

## 2018-10-17 MED FILL — ONDANSETRON HCL 4 MG TABLET: 4 | 6 days supply | Qty: 20 | Fill #0

## 2018-10-17 NOTE — Telephone Encounter (Signed)
Pt is due for a refill on hydrocodone. [place] [facility] checked, hydrocodone last filled on 07/22/18.   Pt is also asking for an anti-nausea RX. Will send to Dr. Jannifer Franklin for review.

## 2018-10-17 NOTE — Telephone Encounter (Signed)
Pt is needing a refill for her HYDROcodone-acetaminophen (NORCO/VICODIN) 5-325 MG tablet and is also needing something prescribed for her nausea called in at [facility]. Please advise.

## 2018-10-24 MED FILL — DEXAMETHASONE 1 MG TABLET: 1 | 30 days supply | Qty: 60 | Fill #0

## 2018-10-24 MED FILL — PROMETHAZINE 25 MG TABLET: 25 | 10 days supply | Qty: 60 | Fill #0

## 2018-10-28 DIAGNOSIS — C712 Malignant neoplasm of temporal lobe: Secondary | ICD-10-CM | POA: Diagnosis not present

## 2018-11-03 ENCOUNTER — Ambulatory Visit: Payer: 59 | Admitting: Adult Health

## 2018-11-03 DIAGNOSIS — C712 Malignant neoplasm of temporal lobe: Secondary | ICD-10-CM | POA: Diagnosis not present

## 2018-11-06 ENCOUNTER — Other Ambulatory Visit: Payer: Self-pay | Admitting: *Deleted

## 2018-11-06 DIAGNOSIS — C718 Malignant neoplasm of overlapping sites of brain: Secondary | ICD-10-CM | POA: Diagnosis not present

## 2018-11-06 DIAGNOSIS — Z48811 Encounter for surgical aftercare following surgery on the nervous system: Secondary | ICD-10-CM | POA: Diagnosis not present

## 2018-11-06 DIAGNOSIS — C712 Malignant neoplasm of temporal lobe: Secondary | ICD-10-CM | POA: Diagnosis not present

## 2018-11-06 DIAGNOSIS — I10 Essential (primary) hypertension: Secondary | ICD-10-CM | POA: Diagnosis not present

## 2018-11-06 DIAGNOSIS — K219 Gastro-esophageal reflux disease without esophagitis: Secondary | ICD-10-CM | POA: Diagnosis not present

## 2018-11-06 DIAGNOSIS — C714 Malignant neoplasm of occipital lobe: Secondary | ICD-10-CM | POA: Diagnosis not present

## 2018-11-06 DIAGNOSIS — G43909 Migraine, unspecified, not intractable, without status migrainosus: Secondary | ICD-10-CM | POA: Diagnosis not present

## 2018-11-06 DIAGNOSIS — Z006 Encounter for examination for normal comparison and control in clinical research program: Secondary | ICD-10-CM | POA: Diagnosis not present

## 2018-11-06 NOTE — Patient Outreach (Signed)
[facility]) Care Management  11/06/2018  [name] [name] [birth year] [id number]   Preoperative Phone call  Telephone call to patient via mobile number. 2  HIPAA identifiers verified. Discussed purpose of pre-op call. Patient voices understanding and agrees to pre-op call.  She states she has completed her medical leave paperwork. She says she does have the hospital indemnity benefit and is aware she will have to file the claim after her surgery. She says she will have 24/7 care at home to assist in her recovery provided by her husband and daughter, "who is also a nurse". She says she was told she will be in the hospital for 2-3 days.  She agrees to a post hospital discharge transition of care call.     Objective:  Per chart review, patient completed her preadmission lab testing at [facility] on 11/03/18 and she met with her neurosurgeon and anesthesiologist today for her preadmission visit. She is scheduled for resection of a malignant neoplasm ( glioblastoma multiforme) of the temporal lobe on 11/07/18.     Assessment: Preoperative call completed, no preoperative needs identified.  Patient voices good understanding of surgery, arrival time to hospital and pre-op instructions, anticipated hospital stay, recovery plan and anticipated time away from work.    Plan: RNCM will call patient for transition of care outreach within 72 hours of hospital discharge notification.     Barrington Ellison RN,CCM,CDE [place] Management Coordinator Office Phone [phone] Office Fax [phone]

## 2018-11-07 HISTORY — PX: CRANIOTOMY: SHX93

## 2018-11-08 DIAGNOSIS — K219 Gastro-esophageal reflux disease without esophagitis: Secondary | ICD-10-CM | POA: Diagnosis not present

## 2018-11-08 DIAGNOSIS — Z9889 Other specified postprocedural states: Secondary | ICD-10-CM | POA: Diagnosis not present

## 2018-11-08 DIAGNOSIS — C719 Malignant neoplasm of brain, unspecified: Secondary | ICD-10-CM | POA: Diagnosis not present

## 2018-11-08 DIAGNOSIS — G43909 Migraine, unspecified, not intractable, without status migrainosus: Secondary | ICD-10-CM | POA: Diagnosis not present

## 2018-11-08 DIAGNOSIS — C712 Malignant neoplasm of temporal lobe: Secondary | ICD-10-CM | POA: Diagnosis not present

## 2018-11-08 DIAGNOSIS — I10 Essential (primary) hypertension: Secondary | ICD-10-CM | POA: Diagnosis not present

## 2018-11-08 DIAGNOSIS — Z006 Encounter for examination for normal comparison and control in clinical research program: Secondary | ICD-10-CM | POA: Diagnosis not present

## 2018-11-10 ENCOUNTER — Encounter: Payer: Self-pay | Admitting: *Deleted

## 2018-11-10 ENCOUNTER — Other Ambulatory Visit: Payer: Self-pay | Admitting: *Deleted

## 2018-11-10 MED ORDER — DEXTROSE 10 % IV SOLN
125.00 | INTRAVENOUS | Status: DC
Start: ? — End: 2018-11-10

## 2018-11-10 MED ORDER — GENERIC EXTERNAL MEDICATION
5.00 | Status: DC
Start: ? — End: 2018-11-10

## 2018-11-10 MED ORDER — MUPIROCIN 2 % EX OINT
TOPICAL_OINTMENT | CUTANEOUS | Status: DC
Start: 2018-11-09 — End: 2018-11-10

## 2018-11-10 MED ORDER — ONDANSETRON HCL 4 MG PO TABS
8.00 | ORAL_TABLET | ORAL | Status: DC
Start: ? — End: 2018-11-10

## 2018-11-10 MED ORDER — LABETALOL HCL 5 MG/ML IV SOLN
10.00 | INTRAVENOUS | Status: DC
Start: ? — End: 2018-11-10

## 2018-11-10 MED ORDER — CLONIDINE HCL 0.1 MG PO TABS
0.10 | ORAL_TABLET | ORAL | Status: DC
Start: 2018-11-10 — End: 2018-11-10

## 2018-11-10 MED ORDER — GENERIC EXTERNAL MEDICATION
17.00 | Status: DC
Start: ? — End: 2018-11-10

## 2018-11-10 MED ORDER — DIVALPROEX SODIUM 125 MG PO DR TAB
125.00 | DELAYED_RELEASE_TABLET | ORAL | Status: DC
Start: 2018-11-10 — End: 2018-11-10

## 2018-11-10 MED ORDER — PANTOPRAZOLE SODIUM 40 MG PO TBEC
40.00 | DELAYED_RELEASE_TABLET | ORAL | Status: DC
Start: 2018-11-10 — End: 2018-11-10

## 2018-11-10 MED ORDER — HYDROCODONE-ACETAMINOPHEN 5-325 MG PO TABS
1.00 | ORAL_TABLET | ORAL | Status: DC
Start: ? — End: 2018-11-10

## 2018-11-10 MED ORDER — INSULIN LISPRO 100 UNIT/ML ~~LOC~~ SOLN
2.00 | SUBCUTANEOUS | Status: DC
Start: 2018-11-09 — End: 2018-11-10

## 2018-11-10 MED ORDER — CYCLOBENZAPRINE HCL 10 MG PO TABS
10.00 | ORAL_TABLET | ORAL | Status: DC
Start: ? — End: 2018-11-10

## 2018-11-10 MED ORDER — GENERIC EXTERNAL MEDICATION
4.00 | Status: DC
Start: ? — End: 2018-11-10

## 2018-11-10 MED ORDER — METOPROLOL TARTRATE 25 MG PO TABS
25.00 | ORAL_TABLET | ORAL | Status: DC
Start: 2018-11-10 — End: 2018-11-10

## 2018-11-10 NOTE — Patient Outreach (Signed)
[facility]) Care Management  11/10/2018  MARGERET STACHNIK Mar 28, 1961 [id number]   Subjective: Telephone call to patient's home number, spoke with patient, and HIPAA verified.  Discussed [facility] Care Management UMR Transition of care follow up, patient voiced understanding, and is in agreement to follow up.  Patient states she doing pretty good, pain medications are managing the pain, brain surgery went well, has surgeon follow up appointment  on 11/20/2018, and 12/08/2018.  Discussed importance of hospital follow up with primary MD, patient voices understanding, and states she will follow up as appropriate. Patient states she is able to manage self care and has assistance as needed. Patient voices understanding of medical diagnosis, surgery, and treatment plan.  [facility] benefits discussed on 11/06/2018 preoperative call, states she has additional questions regarding how benefits will be paid while on leave, advised to call [place] [facility] (verbally given contact number [phone]) or [place] (verbally given contact number [phone]), patient voices understanding, and states she will follow up.  Discussed Advanced Directives, advised of [employer] [employer] Spiritual Care Advanced Directives document completion benefit, patient voices understanding, very appreciative of the information, and states she will access benefit at a later time.   States she is accessing the following [facility] benefits: outpatient pharmacy, hospital indemnity, family medical leave act (FMLA) in place, and short term disability.  Patient states she does not have any education material, transition of care, care coordination, disease management, disease monitoring, transportation, community resource, or pharmacy needs at this time. States she is very appreciative of the follow up and is in agreement to receive [place] Management information.      Objective: Per KPN (Knowledge Performance  Now, point of care tool) and chart review, patient hospitalized 11/07/2018 -11/09/2018 for Malignant neoplasm of temporal lobe Glioblastoma multiforme of temporal lobe, status post CRANIOTOMY on 11/07/2018 at [facility].   Patient also has history of brain cancer, hypertension, migraine, and PVC.       Assessment: Received UMR Transition of care referral from [place] [facility] [place] [facility] on 11/07/2018.  Transition of care follow up completed, no care management needs, and will proceed with case closure.      Plan: RNCM will send patient successful outreach letter, [facility] pamphlet, and magnet. RNCM will complete case closure due to follow up completed / no care management needs.       Bibiana Gillean H. Annia Friendly, BSN, [place] Management [facility] Telephonic CM Phone: [phone] Fax: [phone]

## 2018-11-11 MED FILL — CloNIDine HCL 0.1 MG TAB: 0.1 | 30 days supply | Qty: 60 | Fill #2

## 2018-11-11 MED FILL — DIVALPROEX SOD ER 500 MG TA: 500 | 30 days supply | Qty: 60 | Fill #1

## 2018-11-11 MED FILL — CYCLOBENZAPRINE HCL 10 MG T: 10 | 10 days supply | Qty: 30 | Fill #0

## 2018-11-11 MED FILL — HYDROCODON-APAP 5-325: 5-325 | 5 days supply | Qty: 30 | Fill #0

## 2018-11-11 MED FILL — ONDANSETRON HCL 4 MG TABLET: 4 | 6 days supply | Qty: 20 | Fill #1

## 2018-11-11 MED FILL — MELOXICAM 7.5 MG TABLET: 7.5 | 30 days supply | Qty: 30 | Fill #1

## 2018-11-20 MED FILL — DEXAMETHASONE 1 MG TABLET: 1 | 14 days supply | Qty: 112 | Fill #0

## 2018-11-27 DIAGNOSIS — C712 Malignant neoplasm of temporal lobe: Secondary | ICD-10-CM | POA: Diagnosis not present

## 2018-12-05 DIAGNOSIS — C712 Malignant neoplasm of temporal lobe: Secondary | ICD-10-CM | POA: Diagnosis not present

## 2018-12-08 DIAGNOSIS — C712 Malignant neoplasm of temporal lobe: Secondary | ICD-10-CM | POA: Diagnosis not present

## 2018-12-08 MED FILL — DEXAMETHASONE 1 MG TABLET: 1 | 23 days supply | Qty: 30 | Fill #0

## 2018-12-15 ENCOUNTER — Other Ambulatory Visit (HOSPITAL_COMMUNITY)
Admission: RE | Admit: 2018-12-15 | Discharge: 2018-12-15 | Disposition: A | Discharge status: Home / Self Care | Payer: 59 | Source: Ambulatory Visit | Attending: Family Medicine | Admitting: Family Medicine

## 2018-12-15 DIAGNOSIS — C719 Malignant neoplasm of brain, unspecified: Secondary | ICD-10-CM | POA: Insufficient documentation

## 2018-12-15 LAB — CBC WITH DIFFERENTIAL/PLATELET
Abs Immature Granulocytes: 0.05 10*3/uL (ref 0.00–0.07)
BASOS ABS: 0 10*3/uL (ref 0.0–0.1)
Basophils Relative: 0 %
Eosinophils Absolute: 0.1 10*3/uL (ref 0.0–0.5)
Eosinophils Relative: 2 %
HCT: 48.5 % — ABNORMAL HIGH (ref 36.0–46.0)
Hemoglobin: 14.6 g/dL (ref 12.0–15.0)
Immature Granulocytes: 1 %
Lymphocytes Relative: 26 %
Lymphs Abs: 1.9 10*3/uL (ref 0.7–4.0)
MCH: 29.7 pg (ref 26.0–34.0)
MCHC: 30.1 g/dL (ref 30.0–36.0)
MCV: 98.6 fL (ref 80.0–100.0)
Monocytes Absolute: 0.4 10*3/uL (ref 0.1–1.0)
Monocytes Relative: 6 %
NEUTROS ABS: 5 10*3/uL (ref 1.7–7.7)
Neutrophils Relative %: 65 %
Platelets: 189 10*3/uL (ref 150–400)
RBC: 4.92 MIL/uL (ref 3.87–5.11)
RDW: 14.6 % (ref 11.5–15.5)
WBC: 7.5 10*3/uL (ref 4.0–10.5)
nRBC: 0 % (ref 0.0–0.2)

## 2018-12-22 ENCOUNTER — Other Ambulatory Visit (HOSPITAL_COMMUNITY): Admission: RE | Admit: 2018-12-22 | Payer: 59 | Source: Ambulatory Visit

## 2018-12-23 ENCOUNTER — Other Ambulatory Visit (HOSPITAL_COMMUNITY)
Admission: RE | Admit: 2018-12-23 | Discharge: 2018-12-23 | Disposition: A | Discharge status: Home / Self Care | Payer: 59 | Source: Ambulatory Visit | Attending: Family Medicine | Admitting: Family Medicine

## 2018-12-23 DIAGNOSIS — C719 Malignant neoplasm of brain, unspecified: Secondary | ICD-10-CM | POA: Insufficient documentation

## 2018-12-23 LAB — CBC WITH DIFFERENTIAL/PLATELET
Abs Immature Granulocytes: 0.04 10*3/uL (ref 0.00–0.07)
BASOS ABS: 0 10*3/uL (ref 0.0–0.1)
Basophils Relative: 1 %
Eosinophils Absolute: 0.2 10*3/uL (ref 0.0–0.5)
Eosinophils Relative: 3 %
HCT: 43.5 % (ref 36.0–46.0)
Hemoglobin: 13.5 g/dL (ref 12.0–15.0)
IMMATURE GRANULOCYTES: 1 %
Lymphocytes Relative: 34 %
Lymphs Abs: 2.2 10*3/uL (ref 0.7–4.0)
MCH: 30.2 pg (ref 26.0–34.0)
MCHC: 31 g/dL (ref 30.0–36.0)
MCV: 97.3 fL (ref 80.0–100.0)
Monocytes Absolute: 0.6 10*3/uL (ref 0.1–1.0)
Monocytes Relative: 9 %
NEUTROS PCT: 52 %
NRBC: 0 % (ref 0.0–0.2)
Neutro Abs: 3.4 10*3/uL (ref 1.7–7.7)
Platelets: 181 10*3/uL (ref 150–400)
RBC: 4.47 MIL/uL (ref 3.87–5.11)
RDW: 14.2 % (ref 11.5–15.5)
WBC: 6.4 10*3/uL (ref 4.0–10.5)

## 2018-12-26 MED FILL — MELOXICAM 7.5 MG TABLET: 7.5 | 30 days supply | Qty: 30 | Fill #2

## 2018-12-29 ENCOUNTER — Other Ambulatory Visit (HOSPITAL_COMMUNITY): Admission: RE | Admit: 2018-12-29 | Payer: 59 | Source: Ambulatory Visit

## 2018-12-29 DIAGNOSIS — C718 Malignant neoplasm of overlapping sites of brain: Secondary | ICD-10-CM | POA: Diagnosis not present

## 2018-12-29 DIAGNOSIS — G43909 Migraine, unspecified, not intractable, without status migrainosus: Secondary | ICD-10-CM | POA: Diagnosis not present

## 2018-12-29 DIAGNOSIS — G8929 Other chronic pain: Secondary | ICD-10-CM | POA: Diagnosis not present

## 2018-12-29 DIAGNOSIS — C712 Malignant neoplasm of temporal lobe: Secondary | ICD-10-CM | POA: Diagnosis not present

## 2018-12-29 DIAGNOSIS — B001 Herpesviral vesicular dermatitis: Secondary | ICD-10-CM | POA: Diagnosis not present

## 2018-12-29 DIAGNOSIS — Z006 Encounter for examination for normal comparison and control in clinical research program: Secondary | ICD-10-CM | POA: Diagnosis not present

## 2018-12-29 DIAGNOSIS — L719 Rosacea, unspecified: Secondary | ICD-10-CM | POA: Diagnosis not present

## 2018-12-29 DIAGNOSIS — M545 Low back pain: Secondary | ICD-10-CM | POA: Diagnosis not present

## 2018-12-29 DIAGNOSIS — Z9884 Bariatric surgery status: Secondary | ICD-10-CM | POA: Diagnosis not present

## 2018-12-29 DIAGNOSIS — M519 Unspecified thoracic, thoracolumbar and lumbosacral intervertebral disc disorder: Secondary | ICD-10-CM | POA: Diagnosis not present

## 2018-12-29 MED FILL — valACYclovir HCL 1 GM TABS: 1 | 1 days supply | Qty: 4 | Fill #0

## 2019-01-05 ENCOUNTER — Other Ambulatory Visit (HOSPITAL_COMMUNITY)
Admission: RE | Admit: 2019-01-05 | Discharge: 2019-01-05 | Disposition: A | Discharge status: Home / Self Care | Payer: 59 | Source: Ambulatory Visit | Attending: Family Medicine | Admitting: Family Medicine

## 2019-01-05 DIAGNOSIS — C719 Malignant neoplasm of brain, unspecified: Secondary | ICD-10-CM | POA: Diagnosis not present

## 2019-01-05 LAB — CBC WITH DIFFERENTIAL/PLATELET
Abs Immature Granulocytes: 0.05 10*3/uL (ref 0.00–0.07)
Basophils Absolute: 0 10*3/uL (ref 0.0–0.1)
Basophils Relative: 1 %
Eosinophils Absolute: 0.2 10*3/uL (ref 0.0–0.5)
Eosinophils Relative: 3 %
HCT: 44.5 % (ref 36.0–46.0)
Hemoglobin: 14.7 g/dL (ref 12.0–15.0)
Immature Granulocytes: 1 %
Lymphocytes Relative: 22 %
Lymphs Abs: 1.2 10*3/uL (ref 0.7–4.0)
MCH: 30.4 pg (ref 26.0–34.0)
MCHC: 33 g/dL (ref 30.0–36.0)
MCV: 91.9 fL (ref 80.0–100.0)
Monocytes Absolute: 0.8 10*3/uL (ref 0.1–1.0)
Monocytes Relative: 14 %
NRBC: 0 % (ref 0.0–0.2)
Neutro Abs: 3.4 10*3/uL (ref 1.7–7.7)
Neutrophils Relative %: 59 %
Platelets: 311 10*3/uL (ref 150–400)
RBC: 4.84 MIL/uL (ref 3.87–5.11)
RDW: 13.4 % (ref 11.5–15.5)
WBC: 5.6 10*3/uL (ref 4.0–10.5)

## 2019-01-06 MED FILL — ONDANSETRON HCL 8 MG TABLET: 8 | 20 days supply | Qty: 60 | Fill #0

## 2019-01-06 MED FILL — HYDROCODON-APAP 5-325: 5-325 | 3 days supply | Qty: 30 | Fill #0

## 2019-01-09 ENCOUNTER — Telehealth: Payer: Self-pay | Admitting: Family Medicine

## 2019-01-09 NOTE — Telephone Encounter (Signed)
Rec'd from [facility] forwarded 7 pages to Dr. Lucille Passy

## 2019-01-12 ENCOUNTER — Other Ambulatory Visit (HOSPITAL_COMMUNITY)
Admission: RE | Admit: 2019-01-12 | Discharge: 2019-01-12 | Disposition: A | Discharge status: Home / Self Care | Payer: 59 | Source: Ambulatory Visit | Attending: Family Medicine | Admitting: Family Medicine

## 2019-01-12 DIAGNOSIS — C719 Malignant neoplasm of brain, unspecified: Secondary | ICD-10-CM | POA: Insufficient documentation

## 2019-01-12 LAB — CBC WITH DIFFERENTIAL/PLATELET
Abs Immature Granulocytes: 0.02 10*3/uL (ref 0.00–0.07)
Basophils Absolute: 0 10*3/uL (ref 0.0–0.1)
Basophils Relative: 1 %
Eosinophils Absolute: 0.3 10*3/uL (ref 0.0–0.5)
Eosinophils Relative: 4 %
HCT: 43.2 % (ref 36.0–46.0)
Hemoglobin: 13.3 g/dL (ref 12.0–15.0)
Immature Granulocytes: 0 %
Lymphocytes Relative: 27 %
Lymphs Abs: 1.7 10*3/uL (ref 0.7–4.0)
MCH: 29.7 pg (ref 26.0–34.0)
MCHC: 30.8 g/dL (ref 30.0–36.0)
MCV: 96.4 fL (ref 80.0–100.0)
Monocytes Absolute: 0.6 10*3/uL (ref 0.1–1.0)
Monocytes Relative: 10 %
Neutro Abs: 3.6 10*3/uL (ref 1.7–7.7)
Neutrophils Relative %: 58 %
Platelets: 230 10*3/uL (ref 150–400)
RBC: 4.48 MIL/uL (ref 3.87–5.11)
RDW: 13.6 % (ref 11.5–15.5)
WBC: 6.2 10*3/uL (ref 4.0–10.5)
nRBC: 0 % (ref 0.0–0.2)

## 2019-01-12 MED FILL — PANTOPRAZOLE SOD DR 40 MG T: 40 | 90 days supply | Qty: 90 | Fill #2

## 2019-01-19 DIAGNOSIS — I10 Essential (primary) hypertension: Secondary | ICD-10-CM | POA: Diagnosis not present

## 2019-01-19 DIAGNOSIS — B001 Herpesviral vesicular dermatitis: Secondary | ICD-10-CM | POA: Diagnosis not present

## 2019-01-19 DIAGNOSIS — R110 Nausea: Secondary | ICD-10-CM | POA: Diagnosis not present

## 2019-01-19 DIAGNOSIS — M545 Low back pain: Secondary | ICD-10-CM | POA: Diagnosis not present

## 2019-01-19 DIAGNOSIS — G8929 Other chronic pain: Secondary | ICD-10-CM | POA: Diagnosis not present

## 2019-01-19 DIAGNOSIS — C712 Malignant neoplasm of temporal lobe: Secondary | ICD-10-CM | POA: Diagnosis not present

## 2019-01-19 DIAGNOSIS — Z9884 Bariatric surgery status: Secondary | ICD-10-CM | POA: Diagnosis not present

## 2019-01-19 DIAGNOSIS — G936 Cerebral edema: Secondary | ICD-10-CM | POA: Diagnosis not present

## 2019-01-19 DIAGNOSIS — L719 Rosacea, unspecified: Secondary | ICD-10-CM | POA: Diagnosis not present

## 2019-01-19 DIAGNOSIS — G43909 Migraine, unspecified, not intractable, without status migrainosus: Secondary | ICD-10-CM | POA: Diagnosis not present

## 2019-01-19 LAB — CBC AND DIFFERENTIAL
HCT: 40 (ref 36–46)
Hemoglobin: 13.4 (ref 12.0–16.0)
Neutrophils Absolute: 4
Platelets: 185 (ref 150–399)
WBC: 6.3

## 2019-01-19 LAB — BASIC METABOLIC PANEL
BUN: 11 (ref 4–21)
Creatinine: 0.9 (ref 0.5–1.1)
Glucose: 139
Potassium: 3.8 (ref 3.4–5.3)
Sodium: 140 (ref 137–147)

## 2019-01-19 LAB — HEPATIC FUNCTION PANEL
ALT: 14 (ref 7–35)
AST: 19 (ref 13–35)
Alkaline Phosphatase: 64 (ref 25–125)
Bilirubin, Total: 0.5

## 2019-01-19 MED FILL — GRANISETRON HCL 1 MG TAB: 1 | 30 days supply | Qty: 8 | Fill #0

## 2019-01-19 MED FILL — DEXAMETHASONE 1 MG TABLET: 1 | 30 days supply | Qty: 30 | Fill #0

## 2019-01-26 ENCOUNTER — Other Ambulatory Visit (HOSPITAL_COMMUNITY)
Admission: RE | Admit: 2019-01-26 | Discharge: 2019-01-26 | Disposition: A | Discharge status: Home / Self Care | Payer: 59 | Source: Ambulatory Visit | Attending: Family Medicine | Admitting: Family Medicine

## 2019-01-26 DIAGNOSIS — C719 Malignant neoplasm of brain, unspecified: Secondary | ICD-10-CM | POA: Diagnosis not present

## 2019-01-26 LAB — CBC WITH DIFFERENTIAL/PLATELET
Abs Immature Granulocytes: 0.02 10*3/uL (ref 0.00–0.07)
Basophils Absolute: 0 10*3/uL (ref 0.0–0.1)
Basophils Relative: 0 %
Eosinophils Absolute: 0.1 10*3/uL (ref 0.0–0.5)
Eosinophils Relative: 2 %
HCT: 45.9 % (ref 36.0–46.0)
Hemoglobin: 14.8 g/dL (ref 12.0–15.0)
Immature Granulocytes: 0 %
Lymphocytes Relative: 21 %
Lymphs Abs: 1.4 10*3/uL (ref 0.7–4.0)
MCH: 31 pg (ref 26.0–34.0)
MCHC: 32.2 g/dL (ref 30.0–36.0)
MCV: 96 fL (ref 80.0–100.0)
Monocytes Absolute: 0.5 10*3/uL (ref 0.1–1.0)
Monocytes Relative: 6 %
Neutro Abs: 5 10*3/uL (ref 1.7–7.7)
Neutrophils Relative %: 71 %
Platelets: 132 10*3/uL — ABNORMAL LOW (ref 150–400)
RBC: 4.78 MIL/uL (ref 3.87–5.11)
RDW: 13.4 % (ref 11.5–15.5)
WBC: 7 10*3/uL (ref 4.0–10.5)
nRBC: 0 % (ref 0.0–0.2)

## 2019-01-26 MED FILL — METOPROLOL TARTRATE 25 MG T: 25 | 90 days supply | Qty: 180 | Fill #1

## 2019-01-26 MED FILL — DIVALPROEX SOD ER 500 MG TA: 500 | 30 days supply | Qty: 60 | Fill #2

## 2019-01-26 MED FILL — CloNIDine HCL 0.1 MG TAB: 0.1 | 30 days supply | Qty: 60 | Fill #3

## 2019-01-26 MED FILL — MELOXICAM 7.5 MG TABLET: 7.5 | 30 days supply | Qty: 30 | Fill #3

## 2019-01-28 ENCOUNTER — Encounter: Payer: Self-pay | Admitting: Family Medicine

## 2019-01-28 NOTE — Progress Notes (Signed)
[facility]/thx dmf

## 2019-02-02 ENCOUNTER — Other Ambulatory Visit (HOSPITAL_COMMUNITY)
Admission: RE | Admit: 2019-02-02 | Discharge: 2019-02-02 | Disposition: A | Discharge status: Home / Self Care | Payer: 59 | Source: Other Acute Inpatient Hospital | Attending: Family Medicine | Admitting: Family Medicine

## 2019-02-02 DIAGNOSIS — C719 Malignant neoplasm of brain, unspecified: Secondary | ICD-10-CM | POA: Diagnosis not present

## 2019-02-02 LAB — CBC WITH DIFFERENTIAL/PLATELET
Abs Immature Granulocytes: 0.01 10*3/uL (ref 0.00–0.07)
Basophils Absolute: 0 10*3/uL (ref 0.0–0.1)
Basophils Relative: 0 %
Eosinophils Absolute: 0.2 10*3/uL (ref 0.0–0.5)
Eosinophils Relative: 4 %
HCT: 41.6 % (ref 36.0–46.0)
Hemoglobin: 13.1 g/dL (ref 12.0–15.0)
Immature Granulocytes: 0 %
Lymphocytes Relative: 39 %
Lymphs Abs: 2.2 10*3/uL (ref 0.7–4.0)
MCH: 30.8 pg (ref 26.0–34.0)
MCHC: 31.5 g/dL (ref 30.0–36.0)
MCV: 97.7 fL (ref 80.0–100.0)
Monocytes Absolute: 0.4 10*3/uL (ref 0.1–1.0)
Monocytes Relative: 7 %
Neutro Abs: 2.8 10*3/uL (ref 1.7–7.7)
Neutrophils Relative %: 50 %
Platelets: 124 10*3/uL — ABNORMAL LOW (ref 150–400)
RBC: 4.26 MIL/uL (ref 3.87–5.11)
RDW: 13.6 % (ref 11.5–15.5)
WBC: 5.7 10*3/uL (ref 4.0–10.5)
nRBC: 0 % (ref 0.0–0.2)

## 2019-02-05 ENCOUNTER — Telehealth: Payer: Self-pay | Admitting: Neurology

## 2019-02-05 NOTE — Telephone Encounter (Signed)
Due to current COVID 19 pandemic, our office is severely reducing in office visits for at least the next 2 weeks, in order to minimize the risk to our patients and healthcare providers. Pt understands that although there may be some limitations with this type of visit, we will take all precautions to reduce any security or privacy concerns.  Pt understands that this will be treated like an in office visit and we will file with pt's insurance, and there may be a patient responsible charge related to this service.  Email sent to [email or web address] per pt

## 2019-02-09 DIAGNOSIS — Z9884 Bariatric surgery status: Secondary | ICD-10-CM | POA: Diagnosis not present

## 2019-02-09 DIAGNOSIS — I959 Hypotension, unspecified: Secondary | ICD-10-CM | POA: Diagnosis not present

## 2019-02-09 DIAGNOSIS — G8929 Other chronic pain: Secondary | ICD-10-CM | POA: Diagnosis not present

## 2019-02-09 DIAGNOSIS — Z872 Personal history of diseases of the skin and subcutaneous tissue: Secondary | ICD-10-CM | POA: Diagnosis not present

## 2019-02-09 DIAGNOSIS — Z8719 Personal history of other diseases of the digestive system: Secondary | ICD-10-CM | POA: Diagnosis not present

## 2019-02-09 DIAGNOSIS — M519 Unspecified thoracic, thoracolumbar and lumbosacral intervertebral disc disorder: Secondary | ICD-10-CM | POA: Diagnosis not present

## 2019-02-09 DIAGNOSIS — B001 Herpesviral vesicular dermatitis: Secondary | ICD-10-CM | POA: Diagnosis not present

## 2019-02-09 DIAGNOSIS — G43909 Migraine, unspecified, not intractable, without status migrainosus: Secondary | ICD-10-CM | POA: Diagnosis not present

## 2019-02-09 DIAGNOSIS — L719 Rosacea, unspecified: Secondary | ICD-10-CM | POA: Diagnosis not present

## 2019-02-09 DIAGNOSIS — Z923 Personal history of irradiation: Secondary | ICD-10-CM | POA: Diagnosis not present

## 2019-02-09 DIAGNOSIS — Z95828 Presence of other vascular implants and grafts: Secondary | ICD-10-CM | POA: Diagnosis not present

## 2019-02-09 DIAGNOSIS — M542 Cervicalgia: Secondary | ICD-10-CM | POA: Diagnosis not present

## 2019-02-09 DIAGNOSIS — I10 Essential (primary) hypertension: Secondary | ICD-10-CM | POA: Diagnosis not present

## 2019-02-09 DIAGNOSIS — M545 Low back pain: Secondary | ICD-10-CM | POA: Diagnosis not present

## 2019-02-09 DIAGNOSIS — C712 Malignant neoplasm of temporal lobe: Secondary | ICD-10-CM | POA: Diagnosis not present

## 2019-02-09 NOTE — Telephone Encounter (Signed)
I contacted the pt and left a vm asking her to call me back so we could complete the pre charting for 02/10/2019 visit.

## 2019-02-10 ENCOUNTER — Encounter: Payer: Self-pay | Admitting: Neurology

## 2019-02-10 ENCOUNTER — Ambulatory Visit (INDEPENDENT_AMBULATORY_CARE_PROVIDER_SITE_OTHER): Payer: 59 | Admitting: Neurology

## 2019-02-10 ENCOUNTER — Other Ambulatory Visit: Payer: Self-pay

## 2019-02-10 DIAGNOSIS — C719 Malignant neoplasm of brain, unspecified: Secondary | ICD-10-CM | POA: Diagnosis not present

## 2019-02-10 DIAGNOSIS — G43709 Chronic migraine without aura, not intractable, without status migrainosus: Secondary | ICD-10-CM

## 2019-02-10 MED ORDER — DIVALPROEX SODIUM 500 MG PO DR TAB: 500.0000 mg | DELAYED_RELEASE_TABLET | Freq: Two times a day (BID) | ORAL | 2 refills | Status: DC

## 2019-02-10 MED FILL — DIVALPROEX SOD DR 500 MG TA: 500 | 30 days supply | Qty: 60 | Fill #0

## 2019-02-10 NOTE — Progress Notes (Signed)
     Virtual Visit via Video Note  I connected with [name] on 02/10/19 at  2:30 PM EDT by a video enabled telemedicine application and verified that I am speaking with the correct person using two identifiers.  Location: Patient: The patient is at home. Provider: Physician is in the office.   I discussed the limitations of evaluation and management by telemedicine and the availability of in person appointments. The patient expressed understanding and agreed to proceed.  History of Present Illness: [name] [name] is a 58 year old right-handed white female with a history of a glioblastoma involving the right brain diagnosed originally in late 2016, the patient had her first debulking surgery on 09 October 2005.  The patient had a recurrence of the tumor in 2014 and had a gamma knife procedure and another craniotomy and December 2014.  The patient has had yet another recurrence and had surgery on 07 November 2018.  She is undergone 3 cycles of chemotherapy that offered no benefit, they are considering another type of therapy at this point.  Along with the chemotherapy and tumor recurrence, the patient has had increased headache.  She has done better with her headaches on the Decadron.  In the past, Depakote has been helpful, weather changes seem to aggravate her headache and Maxalt usually will help.  The patient is now having 3-4 headaches a week, she is on Depakote 500 mg daily.  She reports no focal numbness or weakness of the extremities but she has had a change in her balance since the recent surgery.  The patient has had several falls.  She is having some increased problems with short-term memory.   Observations/Objective: The video evaluation today shows the patient is alert and cooperative, speech is well enunciated, not aphasic or dysarthric.  Patient is full extraocular movements, face is symmetric, she is able to protrude the tongue in the midline with good lateral movement of the  tongue.  She has good finger-nose-finger and heel-to-shin bilaterally.  Gait is relatively unremarkable, she is able to perform tandem gait and Romberg is negative.  Assessment and Plan: 1.  Glioblastoma with recurrence  2.  Migraine headache  The patient will be increased on the Depakote to 500 mg twice daily, the patient will call for any dose adjustments.  She will follow-up here in about 4 months.  She has gotten recent blood work done to include a comprehensive metabolic profile and a CBC.  These are relatively unremarkable.  Follow Up Instructions: 53-month follow-up, may see nurse practitioner.   I discussed the assessment and treatment plan with the patient. The patient was provided an opportunity to ask questions and all were answered. The patient agreed with the plan and demonstrated an understanding of the instructions.   The patient was advised to call back or seek an in-person evaluation if the symptoms worsen or if the condition fails to improve as anticipated.  I provided 25 minutes of non-face-to-face time during this encounter.   Kathrynn Ducking, MD

## 2019-02-11 ENCOUNTER — Telehealth: Payer: Self-pay

## 2019-02-11 NOTE — Telephone Encounter (Signed)
I received a call from [facility] with [name]. She wanted to clarify Depakote rx for [name] [name]. I advised Dr. Jannifer Franklin changed depakote to 500 mg DR 1 tablet twice per day on 02/10/19 after virtual video visit. She verbalized understanding and had no further questions.

## 2019-02-12 ENCOUNTER — Telehealth: Payer: Self-pay | Admitting: Family Medicine

## 2019-02-12 NOTE — Telephone Encounter (Signed)
Called to see if patient wants to set up next CPE, last done 03/17/18

## 2019-02-18 DIAGNOSIS — C719 Malignant neoplasm of brain, unspecified: Secondary | ICD-10-CM | POA: Diagnosis not present

## 2019-02-25 ENCOUNTER — Telehealth: Payer: Self-pay

## 2019-02-25 ENCOUNTER — Telehealth: Payer: Self-pay | Admitting: Neurology

## 2019-02-25 MED ORDER — OXYCODONE-ACETAMINOPHEN 5-325 MG PO TABS: 1.0000 | ORAL_TABLET | Freq: Four times a day (QID) | ORAL | 0 refills | Status: AC | PRN

## 2019-02-25 MED ORDER — HYDROCODONE-ACETAMINOPHEN 5-325 MG PO TABS: 1.0000 | ORAL_TABLET | Freq: Four times a day (QID) | ORAL | 0 refills | Status: AC | PRN

## 2019-02-25 MED FILL — OXYCODONE-ACETAMINOPHEN 5-3: 5-325 | 7 days supply | Qty: 30 | Fill #0

## 2019-02-25 MED FILL — HYDROCODON-APAP 5-325: 5-325 | 7 days supply | Qty: 30 | Fill #0

## 2019-02-25 NOTE — Telephone Encounter (Signed)
Pt wanted to see if Hydrocodone can be refilled. [place] drug registry reviewed. Last two refills for Hydrocodone were on 01/06/19 and 2/04/2. Refill in March was provided by Tomasita Morrow, MD and February refill was provided by Harrison Mons, MD.

## 2019-02-25 NOTE — Telephone Encounter (Signed)
Pt called stating that her procedure is not scheduled until June 15th so she is needing her HYDROcodone-acetaminophen (NORCO/VICODIN) 5-325 MG tablet prescribed until then. Please advise.

## 2019-02-25 NOTE — Telephone Encounter (Signed)
Oxycodone prescription will be sent in.

## 2019-02-25 NOTE — Telephone Encounter (Signed)
[place] drug registry reviewed. Last two refills for Hydrocodone were on 01/06/19 and 2/04/2. Refill in March was provided by Tomasita Morrow, MD and February refill was provided by Harrison Mons, MD.

## 2019-02-25 NOTE — Telephone Encounter (Signed)
Pt has called to ask that she also get the oxyCODONE-acetaminophen (PERCOCET/ROXICET) 5-325 MG tablet  [facility]  Filled also

## 2019-02-25 NOTE — Telephone Encounter (Signed)
Pt has been scheduled for her 4 month f/u with ss NP 06/18/19 at 145 pm.

## 2019-02-25 NOTE — Telephone Encounter (Signed)
[place] drug registry verified on Oxycodone. Last refill was 03/04/2018 provided by Dr. Jannifer Franklin

## 2019-02-25 NOTE — Addendum Note (Signed)
Addended by: Kathrynn Ducking on: 02/25/2019 05:03 PM   Modules accepted: Orders

## 2019-02-25 NOTE — Addendum Note (Signed)
Addended by: Kathrynn Ducking on: 02/25/2019 12:34 PM   Modules accepted: Orders

## 2019-02-25 NOTE — Telephone Encounter (Signed)
I called the patient.  I was not clear why she needed both hydrocodone and oxycodone.  She claims that she uses oxycodone mainly as a backup medication if the hydrocodone does not help her headache.  She has only gotten one small prescription for oxycodone over the last year.  I will call in prescription for the hydrocodone.  The patient has had recurrence of her glioblastoma, she will be getting gamma knife radiation and then chemotherapy with Avastin.

## 2019-03-03 MED FILL — DEXAMETHASONE 1 MG TABLET: 1 | 30 days supply | Qty: 30 | Fill #1

## 2019-03-03 MED FILL — MELOXICAM 7.5 MG TABLET: 7.5 | 90 days supply | Qty: 90 | Fill #0

## 2019-03-23 DIAGNOSIS — Z510 Encounter for antineoplastic radiation therapy: Secondary | ICD-10-CM | POA: Diagnosis not present

## 2019-03-23 DIAGNOSIS — S8262XA Displaced fracture of lateral malleolus of left fibula, initial encounter for closed fracture: Secondary | ICD-10-CM | POA: Diagnosis not present

## 2019-03-23 DIAGNOSIS — C719 Malignant neoplasm of brain, unspecified: Secondary | ICD-10-CM | POA: Diagnosis not present

## 2019-03-23 DIAGNOSIS — C712 Malignant neoplasm of temporal lobe: Secondary | ICD-10-CM | POA: Diagnosis not present

## 2019-03-24 DIAGNOSIS — C718 Malignant neoplasm of overlapping sites of brain: Secondary | ICD-10-CM | POA: Diagnosis not present

## 2019-03-24 DIAGNOSIS — C712 Malignant neoplasm of temporal lobe: Secondary | ICD-10-CM | POA: Diagnosis not present

## 2019-03-24 DIAGNOSIS — C719 Malignant neoplasm of brain, unspecified: Secondary | ICD-10-CM | POA: Diagnosis not present

## 2019-03-24 DIAGNOSIS — C716 Malignant neoplasm of cerebellum: Secondary | ICD-10-CM | POA: Diagnosis not present

## 2019-03-24 DIAGNOSIS — S8262XA Displaced fracture of lateral malleolus of left fibula, initial encounter for closed fracture: Secondary | ICD-10-CM | POA: Diagnosis not present

## 2019-03-24 DIAGNOSIS — Z510 Encounter for antineoplastic radiation therapy: Secondary | ICD-10-CM | POA: Diagnosis not present

## 2019-03-25 DIAGNOSIS — S8262XA Displaced fracture of lateral malleolus of left fibula, initial encounter for closed fracture: Secondary | ICD-10-CM | POA: Diagnosis not present

## 2019-03-25 DIAGNOSIS — Z510 Encounter for antineoplastic radiation therapy: Secondary | ICD-10-CM | POA: Diagnosis not present

## 2019-03-25 DIAGNOSIS — C719 Malignant neoplasm of brain, unspecified: Secondary | ICD-10-CM | POA: Diagnosis not present

## 2019-03-26 DIAGNOSIS — C719 Malignant neoplasm of brain, unspecified: Secondary | ICD-10-CM | POA: Diagnosis not present

## 2019-03-26 DIAGNOSIS — S8262XA Displaced fracture of lateral malleolus of left fibula, initial encounter for closed fracture: Secondary | ICD-10-CM | POA: Diagnosis not present

## 2019-03-26 DIAGNOSIS — Z510 Encounter for antineoplastic radiation therapy: Secondary | ICD-10-CM | POA: Diagnosis not present

## 2019-03-27 DIAGNOSIS — C719 Malignant neoplasm of brain, unspecified: Secondary | ICD-10-CM | POA: Diagnosis not present

## 2019-03-27 DIAGNOSIS — Z510 Encounter for antineoplastic radiation therapy: Secondary | ICD-10-CM | POA: Diagnosis not present

## 2019-03-27 DIAGNOSIS — S8262XA Displaced fracture of lateral malleolus of left fibula, initial encounter for closed fracture: Secondary | ICD-10-CM | POA: Diagnosis not present

## 2019-03-30 DIAGNOSIS — Z95828 Presence of other vascular implants and grafts: Secondary | ICD-10-CM | POA: Diagnosis not present

## 2019-03-30 DIAGNOSIS — C712 Malignant neoplasm of temporal lobe: Secondary | ICD-10-CM | POA: Diagnosis not present

## 2019-03-30 DIAGNOSIS — Z79899 Other long term (current) drug therapy: Secondary | ICD-10-CM | POA: Diagnosis not present

## 2019-03-30 DIAGNOSIS — Z9889 Other specified postprocedural states: Secondary | ICD-10-CM | POA: Diagnosis not present

## 2019-03-30 DIAGNOSIS — I10 Essential (primary) hypertension: Secondary | ICD-10-CM | POA: Diagnosis not present

## 2019-03-30 DIAGNOSIS — R51 Headache: Secondary | ICD-10-CM | POA: Diagnosis not present

## 2019-03-30 DIAGNOSIS — G8929 Other chronic pain: Secondary | ICD-10-CM | POA: Diagnosis not present

## 2019-03-30 DIAGNOSIS — L719 Rosacea, unspecified: Secondary | ICD-10-CM | POA: Diagnosis not present

## 2019-03-30 DIAGNOSIS — Z9884 Bariatric surgery status: Secondary | ICD-10-CM | POA: Diagnosis not present

## 2019-03-30 DIAGNOSIS — G43909 Migraine, unspecified, not intractable, without status migrainosus: Secondary | ICD-10-CM | POA: Diagnosis not present

## 2019-03-30 DIAGNOSIS — M545 Low back pain: Secondary | ICD-10-CM | POA: Diagnosis not present

## 2019-03-30 MED FILL — HYDROCODON-APAP 5-325: 5-325 | 4 days supply | Qty: 30 | Fill #0

## 2019-03-30 MED FILL — DEXAMETHASONE 4 MG TABLET: 4 | 20 days supply | Qty: 25 | Fill #0

## 2019-03-30 MED FILL — oxyCODONE HCL 5 MG TABS: 5 | 10 days supply | Qty: 60 | Fill #0

## 2019-03-31 ENCOUNTER — Other Ambulatory Visit: Payer: Self-pay | Admitting: Neurology

## 2019-03-31 MED FILL — RHOFADE 1 % CREA: 1 | 30 days supply | Qty: 30 | Fill #1

## 2019-03-31 MED FILL — RIZATRIPTAN BENZOATE 10 MG: 10 | 30 days supply | Qty: 10 | Fill #0

## 2019-03-31 MED FILL — PANTOPRAZOLE SOD DR 40 MG T: 40 | 90 days supply | Qty: 90 | Fill #0

## 2019-04-06 DIAGNOSIS — G43909 Migraine, unspecified, not intractable, without status migrainosus: Secondary | ICD-10-CM | POA: Diagnosis not present

## 2019-04-06 DIAGNOSIS — B001 Herpesviral vesicular dermatitis: Secondary | ICD-10-CM | POA: Diagnosis not present

## 2019-04-06 DIAGNOSIS — Z95828 Presence of other vascular implants and grafts: Secondary | ICD-10-CM | POA: Diagnosis not present

## 2019-04-06 DIAGNOSIS — C712 Malignant neoplasm of temporal lobe: Secondary | ICD-10-CM | POA: Diagnosis not present

## 2019-04-06 DIAGNOSIS — Z9889 Other specified postprocedural states: Secondary | ICD-10-CM | POA: Diagnosis not present

## 2019-04-06 DIAGNOSIS — Z8719 Personal history of other diseases of the digestive system: Secondary | ICD-10-CM | POA: Diagnosis not present

## 2019-04-06 DIAGNOSIS — R1115 Cyclical vomiting syndrome unrelated to migraine: Secondary | ICD-10-CM | POA: Diagnosis not present

## 2019-04-06 DIAGNOSIS — Z9884 Bariatric surgery status: Secondary | ICD-10-CM | POA: Diagnosis not present

## 2019-04-06 DIAGNOSIS — L719 Rosacea, unspecified: Secondary | ICD-10-CM | POA: Diagnosis not present

## 2019-04-06 DIAGNOSIS — Z79899 Other long term (current) drug therapy: Secondary | ICD-10-CM | POA: Diagnosis not present

## 2019-04-06 DIAGNOSIS — C718 Malignant neoplasm of overlapping sites of brain: Secondary | ICD-10-CM | POA: Diagnosis not present

## 2019-04-06 MED FILL — CYCLOBENZAPRINE HCL 10 MG T: 10 | 10 days supply | Qty: 30 | Fill #0

## 2019-04-09 ENCOUNTER — Telehealth: Payer: Self-pay | Admitting: Family Medicine

## 2019-04-09 DIAGNOSIS — Z0271 Encounter for disability determination: Secondary | ICD-10-CM

## 2019-04-09 MED FILL — CloNIDine HCL 0.1 MG TAB: 0.1 | 30 days supply | Qty: 60 | Fill #4

## 2019-04-09 MED FILL — DIVALPROEX SOD DR 500 MG TA: 500 | 30 days supply | Qty: 60 | Fill #1

## 2019-04-09 MED FILL — PANTOPRAZOLE SOD DR 40 MG T: 40 | 90 days supply | Qty: 90 | Fill #0

## 2019-04-09 NOTE — Telephone Encounter (Signed)
Opened in error

## 2019-04-20 DIAGNOSIS — Z5111 Encounter for antineoplastic chemotherapy: Secondary | ICD-10-CM | POA: Diagnosis not present

## 2019-04-20 DIAGNOSIS — C718 Malignant neoplasm of overlapping sites of brain: Secondary | ICD-10-CM | POA: Diagnosis not present

## 2019-04-22 ENCOUNTER — Encounter: Payer: 59 | Admitting: Family Medicine

## 2019-05-11 DIAGNOSIS — C712 Malignant neoplasm of temporal lobe: Secondary | ICD-10-CM | POA: Diagnosis not present

## 2019-05-14 DIAGNOSIS — B001 Herpesviral vesicular dermatitis: Secondary | ICD-10-CM | POA: Diagnosis not present

## 2019-05-14 DIAGNOSIS — L719 Rosacea, unspecified: Secondary | ICD-10-CM | POA: Diagnosis not present

## 2019-05-14 DIAGNOSIS — R51 Headache: Secondary | ICD-10-CM | POA: Diagnosis not present

## 2019-05-14 DIAGNOSIS — C712 Malignant neoplasm of temporal lobe: Secondary | ICD-10-CM | POA: Diagnosis not present

## 2019-05-14 DIAGNOSIS — K219 Gastro-esophageal reflux disease without esophagitis: Secondary | ICD-10-CM | POA: Diagnosis not present

## 2019-05-14 DIAGNOSIS — Z9884 Bariatric surgery status: Secondary | ICD-10-CM | POA: Diagnosis not present

## 2019-05-14 DIAGNOSIS — Z79899 Other long term (current) drug therapy: Secondary | ICD-10-CM | POA: Diagnosis not present

## 2019-05-14 DIAGNOSIS — G43909 Migraine, unspecified, not intractable, without status migrainosus: Secondary | ICD-10-CM | POA: Diagnosis not present

## 2019-05-14 DIAGNOSIS — Z79891 Long term (current) use of opiate analgesic: Secondary | ICD-10-CM | POA: Diagnosis not present

## 2019-05-14 MED FILL — oxyCODONE HCL 5 MG TABS: 5 | 10 days supply | Qty: 60 | Fill #0

## 2019-05-14 MED FILL — HYDROCODON-APAP 5-325: 5-325 | 12 days supply | Qty: 50 | Fill #0

## 2019-05-27 ENCOUNTER — Telehealth: Payer: Self-pay | Admitting: Neurology

## 2019-05-27 NOTE — Telephone Encounter (Signed)
error 

## 2019-05-28 ENCOUNTER — Other Ambulatory Visit: Payer: Self-pay | Admitting: Obstetrics and Gynecology

## 2019-05-28 DIAGNOSIS — Z1231 Encounter for screening mammogram for malignant neoplasm of breast: Secondary | ICD-10-CM

## 2019-06-04 DIAGNOSIS — Z5111 Encounter for antineoplastic chemotherapy: Secondary | ICD-10-CM | POA: Diagnosis not present

## 2019-06-04 DIAGNOSIS — C712 Malignant neoplasm of temporal lobe: Secondary | ICD-10-CM | POA: Diagnosis not present

## 2019-06-18 ENCOUNTER — Ambulatory Visit: Payer: Self-pay | Admitting: Neurology

## 2019-06-18 DIAGNOSIS — Z79891 Long term (current) use of opiate analgesic: Secondary | ICD-10-CM | POA: Diagnosis not present

## 2019-06-18 DIAGNOSIS — B001 Herpesviral vesicular dermatitis: Secondary | ICD-10-CM | POA: Diagnosis not present

## 2019-06-18 DIAGNOSIS — C713 Malignant neoplasm of parietal lobe: Secondary | ICD-10-CM | POA: Diagnosis not present

## 2019-06-18 DIAGNOSIS — Z79899 Other long term (current) drug therapy: Secondary | ICD-10-CM | POA: Diagnosis not present

## 2019-06-18 DIAGNOSIS — L719 Rosacea, unspecified: Secondary | ICD-10-CM | POA: Diagnosis not present

## 2019-06-18 DIAGNOSIS — C718 Malignant neoplasm of overlapping sites of brain: Secondary | ICD-10-CM | POA: Diagnosis not present

## 2019-06-18 DIAGNOSIS — R530 Neoplastic (malignant) related fatigue: Secondary | ICD-10-CM | POA: Diagnosis not present

## 2019-06-18 DIAGNOSIS — G43909 Migraine, unspecified, not intractable, without status migrainosus: Secondary | ICD-10-CM | POA: Diagnosis not present

## 2019-06-18 DIAGNOSIS — Z95828 Presence of other vascular implants and grafts: Secondary | ICD-10-CM | POA: Diagnosis not present

## 2019-06-18 DIAGNOSIS — Z9889 Other specified postprocedural states: Secondary | ICD-10-CM | POA: Diagnosis not present

## 2019-06-18 DIAGNOSIS — Z9884 Bariatric surgery status: Secondary | ICD-10-CM | POA: Diagnosis not present

## 2019-06-18 DIAGNOSIS — Z5111 Encounter for antineoplastic chemotherapy: Secondary | ICD-10-CM | POA: Diagnosis not present

## 2019-06-18 DIAGNOSIS — R51 Headache: Secondary | ICD-10-CM | POA: Diagnosis not present

## 2019-06-23 NOTE — Progress Notes (Signed)
Subjective:   Patient ID: [name] [name], female    DOB: [birth year], 58 y.o.   MRN: [id number]  [name] [name] is a pleasant 58 y.o. year old female who presents to clinic today with Annual Exam  on 06/24/2019 / HPI:  Health Maintenance  Topic Date Due  . Hepatitis C Screening  [birth year]  . HIV Screening  02/03/1976  . INFLUENZA VACCINE  05/09/2019  . MAMMOGRAM  06/25/2020  . COLONOSCOPY  02/05/2022  . TETANUS/TDAP  03/17/2028   Mammogram scheduled for 07/16/19.  Has GYN- Dr. Matthew Saras. Colonoscopy not due until 02/2022   [facility]- followed by Dr. Jannifer Franklin and Dr. Maylon Peppers.unfortunately has had  recurrence and progression of disease.  Last saw Dr. Maylon Peppers on 06/18/19- recent gamma knife, undergoing radiation and chemo with Avastin  as well.  Last MRI showed improvement.  Dr. Jannifer Franklin has been seeing her for her migraines as well.Chart reviewed.  She last talked to him via telephone on 02/25/19.  He has been prescribing her hydrocodone and oxycycodone.  Still having chemo related neuropathy.  She has never been prescribed an rx for this.   She is on metoprolol and catapress for HTN and migraine prevention.  Current Outpatient Medications on File Prior to Visit  Medication Sig Dispense Refill  . Calcium Citrate-Vitamin D (CALCIUM CITRATE + PO) Take 1 tablet by mouth 3 (three) times daily.    . cloNIDine (CATAPRES) 0.1 MG tablet Take 0.1 mg by mouth daily.     . cyclobenzaprine (FLEXERIL) 10 MG tablet Take 10 mg by mouth 3 (three) times daily as needed for muscle spasms.    Marland Kitchen dexamethasone (DECADRON) 1 MG tablet 2/2-2/3 Take 4 tabs 3 times daily. 2/3-2/4 take 2 tabs 2 times daily. 2/5-2/6 take 1 tab twice daily. 2/7-2/8 take one tab daily then stop.    . divalproex (DEPAKOTE) 500 MG DR tablet Take 1 tablet (500 mg total) by mouth 2 (two) times daily. 60 tablet 2  . HYDROcodone-acetaminophen (NORCO/VICODIN) 5-325 MG tablet Take 1 tablet by mouth every 6 (six) hours as needed for moderate pain.  30 tablet 0  . LORazepam (ATIVAN) 1 MG tablet Take 1 mg by mouth once as needed (When getting MRIs.).     Marland Kitchen magnesium oxide (MAG-OX) 400 MG tablet Take 400 mg by mouth 2 (two) times daily.     . meloxicam (MOBIC) 7.5 MG tablet Take 7.5 mg by mouth daily.    . metoprolol tartrate (LOPRESSOR) 25 MG tablet TAKE 1 TABLET BY MOUTH TWICE DAILY (Patient taking differently: Take 25 mg by mouth daily. ) 180 tablet 0  . metroNIDAZOLE (METROGEL) 1 % gel Apply 1 application topically daily. For rosacea with papular breakouts.  11  . ondansetron (ZOFRAN) 4 MG tablet Take 1 tablet (4 mg total) by mouth every 8 (eight) hours as needed for nausea or vomiting. 20 tablet 1  . oxyCODONE-acetaminophen (PERCOCET/ROXICET) 5-325 MG tablet Take 1 tablet by mouth every 6 (six) hours as needed (migraine refractory to triptans and Vicodin). 30 tablet 0  . Oxymetazoline HCl (RHOFADE) 1 % CREA Apply 1 application topically as needed.    . pantoprazole (PROTONIX) 40 MG tablet TAKE 1 TABLET BY MOUTH ONCE DAILY 90 tablet 0  . Pediatric Multiple Vitamins (CHEWABLE MULTIPLE VITAMINS PO) Take 1 tablet by mouth 2 (two) times daily.     . rizatriptan (MAXALT) 10 MG tablet TAKE 1 TABLET BY MOUTH TWICE DAILY AS NEEDED FOR MIGRAINE, MAY REPEAT IN 2 HOURS IF NEEDED  10 tablet 5  . valACYclovir (VALTREX) 1000 MG tablet As needed for cold sores  3  . vitamin B-12 (CYANOCOBALAMIN) 250 MCG tablet Take 250 mcg by mouth daily.     No current facility-administered medications on file prior to visit.     Allergies  Allergen Reactions  . Shrimp [Shellfish Allergy] Nausea And Vomiting  . Morphine Other (See Comments)    Bladder Spasms (intolerance) Tolerates dilaudid, fentanyl, oxycodone, hydrocodone  . Latex Rash and Other (See Comments)    Blistering of skin  . Pseudoephedrine Palpitations and Other (See Comments)    Tolerates XR formulation  . Red Dye Hives  . Silver Rash and Other (See Comments)    Tegaderm - if left on for > 24 hr     Past Medical History:  Diagnosis Date  . Anemia   . Bladder spasms   . Brain cancer ([place]) 10/09/05   glioblastoma right temp/occipital  . Complication of anesthesia   . DDD (degenerative disc disease), lumbar   . Depression    Migraines  . GERD (gastroesophageal reflux disease)   . Glioblastoma multiforme ([place]) 2007   Followed by Dr. Vedia Coffer at [facility]  . Hypertension    benign  . Microcytic anemia    iron def  2ndary chronic blood loss  . Migraine, unspecified, without mention of intractable migraine without mention of status migrainosus   . Obesity   . Overweight(278.02)   . PVC (premature ventricular contraction)   . Rash    occasional recurrent  . Tachycardia, unspecified   . Viral meningitis     Past Surgical History:  Procedure Laterality Date  . ABDOMINAL HYSTERECTOMY    . BACK SURGERY     lumbar  . BILATERAL SALPINGOOPHORECTOMY    . BLADDER SURGERY     tack  . BRAIN SURGERY  2014   resection of glioblastoma multiform  . CESAREAN SECTION     x 2   . CRANIOTOMY Right 11/07/2018  . CYSTOCELE REPAIR    . gamma knife     tx of brain tumor  . LAPAROSCOPIC GASTRIC SLEEVE RESECTION WITH HIATAL HERNIA REPAIR N/A 09/06/2015   Procedure: LAPAROSCOPIC GASTRIC SLEEVE RESECTION WITH HIATAL HERNIA REPAIR;  Surgeon: Johnathan Hausen, MD;  Location: [facility];  Service: General;  Laterality: N/A;  . PORTACATH PLACEMENT    . UPPER GI ENDOSCOPY  09/06/2015   Procedure: UPPER GI ENDOSCOPY;  Surgeon: Johnathan Hausen, MD;  Location: [facility];  Service: General;;    Family History  Problem Relation Age of Onset  . Cancer Other        tongue  . Heart disease Mother        bypass age 49  . Heart disease Father        heart failure, atrial fib  . Diabetes Father   . Liver disease Brother   . Diabetes Brother   . Diabetes Sister   . Motor neuron disease Brother   . Colon cancer Neg Hx   . Breast cancer Neg Hx     Social History   Socioeconomic History  . Marital  status: Married    Spouse name: Synetta Shadow   . Number of children: 2  . Years of education: college  . Highest education level: Not on file  Occupational History    Employer: [employer]    Comment: RN  Social Needs  . Financial resource strain: Not on file  . Food insecurity    Worry: Not on  file    Inability: Not on file  . Transportation needs    Medical: Not on file    Non-medical: Not on file  Tobacco Use  . Smoking status: Never Smoker  . Smokeless tobacco: Never Used  Substance and Sexual Activity  . Alcohol use: No    Alcohol/week: 0.0 standard drinks  . Drug use: No  . Sexual activity: Yes  Lifestyle  . Physical activity    Days per week: Not on file    Minutes per session: Not on file  . Stress: Not on file  Relationships  . Social Herbalist on phone: Not on file    Gets together: Not on file    Attends religious service: Not on file    Active member of club or organization: Not on file    Attends meetings of clubs or organizations: Not on file    Relationship status: Not on file  . Intimate partner violence    Fear of current or ex partner: Not on file    Emotionally abused: Not on file    Physically abused: Not on file    Forced sexual activity: Not on file  Other Topics Concern  . Not on file  Social History Narrative   Patient is married and lives at home with her husband Synetta Shadow).   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   Right handed.   Caffeine one mountain dew diet.    The PMH, PSH, Social History, Family History, Medications, and allergies have been reviewed in [facility], and have been updated if relevant.   Review of Systems  Constitutional: Negative.   HENT: Negative.   Eyes: Negative for visual disturbance.  Respiratory: Negative.   Cardiovascular: Negative.   Gastrointestinal: Negative.   Endocrine: Negative.   Genitourinary: Negative.   Musculoskeletal: Positive for arthralgias.  Neurological: Positive for  numbness and headaches. Negative for dizziness, tremors, seizures, syncope, facial asymmetry, speech difficulty, weakness and light-headedness.  Psychiatric/Behavioral: Negative.   All other systems reviewed and are negative.      Objective:    BP 126/80   Pulse 75   Ht 5' 3.5" (1.613 m)   Wt 242 lb (109.8 kg)   SpO2 95%   BMI 42.20 kg/m    Physical Exam   General:  Well-developed,well-nourished,in no acute distress; alert,appropriate and cooperative throughout examination Head:  normocephalic and atraumatic.   Eyes:  vision grossly intact, PERRL Ears:  R ear normal and L ear normal externally, TMs clear bilaterally Nose:  no external deformity.   Mouth:  good dentition.   Neck:  No deformities, masses, or tenderness noted. Lungs:  Normal respiratory effort, chest expands symmetrically. Lungs are clear to auscultation, no crackles or wheezes. Heart:  Normal rate and regular rhythm. S1 and S2 normal without gallop, murmur, click, rub or other extra sounds. Abdomen:  Bowel sounds positive,abdomen soft and non-tender without masses, organomegaly or hernias noted. Msk:  No deformity or scoliosis noted of thoracic or lumbar spine.   Extremities:  Left foot in a boot.   Neurologic:  alert & oriented X3 and gait normal.   Skin:  Intact without suspicious lesions or rashes Psych:  Cognition and judgment appear intact. Alert and cooperative with normal attention span and concentration. No apparent delusions, illusions, hallucinations      Assessment & Plan:   Well woman exam without gynecological exam  Chronic migraine without aura without status migrainosus, not intractable  Chemotherapy-induced  peripheral neuropathy (HCC)  Glioblastoma multiforme (HCC)  Essential hypertension - Plan: Lipid panel, Comprehensive metabolic panel, TSH, CBC with Differential/Platelet  Need for hepatitis C screening test - Plan: Hepatitis C Antibody  Screening for HIV without presence of risk  factors - Plan: HIV antibody (with reflex) No follow-ups on file.

## 2019-06-24 ENCOUNTER — Ambulatory Visit (INDEPENDENT_AMBULATORY_CARE_PROVIDER_SITE_OTHER): Payer: 59 | Admitting: Family Medicine

## 2019-06-24 ENCOUNTER — Encounter: Payer: Self-pay | Admitting: Family Medicine

## 2019-06-24 ENCOUNTER — Other Ambulatory Visit: Payer: Self-pay

## 2019-06-24 VITALS — BP 126/80 | HR 75 | Ht 63.5 in | Wt 242.0 lb

## 2019-06-24 DIAGNOSIS — Z1159 Encounter for screening for other viral diseases: Secondary | ICD-10-CM | POA: Diagnosis not present

## 2019-06-24 DIAGNOSIS — Z114 Encounter for screening for human immunodeficiency virus [HIV]: Secondary | ICD-10-CM | POA: Diagnosis not present

## 2019-06-24 DIAGNOSIS — I10 Essential (primary) hypertension: Secondary | ICD-10-CM | POA: Diagnosis not present

## 2019-06-24 DIAGNOSIS — G620 Drug-induced polyneuropathy: Secondary | ICD-10-CM

## 2019-06-24 DIAGNOSIS — T451X5A Adverse effect of antineoplastic and immunosuppressive drugs, initial encounter: Secondary | ICD-10-CM

## 2019-06-24 DIAGNOSIS — Z0000 Encounter for general adult medical examination without abnormal findings: Secondary | ICD-10-CM

## 2019-06-24 DIAGNOSIS — C719 Malignant neoplasm of brain, unspecified: Secondary | ICD-10-CM

## 2019-06-24 DIAGNOSIS — G43709 Chronic migraine without aura, not intractable, without status migrainosus: Secondary | ICD-10-CM | POA: Diagnosis not present

## 2019-06-24 DIAGNOSIS — E559 Vitamin D deficiency, unspecified: Secondary | ICD-10-CM

## 2019-06-24 LAB — COMPREHENSIVE METABOLIC PANEL
ALT: 11 U/L (ref 0–35)
AST: 17 U/L (ref 0–37)
Albumin: 3.9 g/dL (ref 3.5–5.2)
Alkaline Phosphatase: 54 U/L (ref 39–117)
BUN: 11 mg/dL (ref 6–23)
CO2: 28 mEq/L (ref 19–32)
Calcium: 9.5 mg/dL (ref 8.4–10.5)
Chloride: 105 mEq/L (ref 96–112)
Creatinine, Ser: 0.9 mg/dL (ref 0.40–1.20)
GFR: 64.22 mL/min (ref >60.00)
Glucose, Bld: 86 mg/dL (ref 70–99)
Potassium: 4.3 mEq/L (ref 3.5–5.1)
Sodium: 143 mEq/L (ref 135–145)
Total Bilirubin: 0.6 mg/dL (ref 0.2–1.2)
Total Protein: 6.1 g/dL (ref 6.0–8.3)

## 2019-06-24 LAB — CBC WITH DIFFERENTIAL/PLATELET
Basophils Absolute: 0 10*3/uL (ref 0.0–0.1)
Basophils Relative: 0.3 % (ref 0.0–3.0)
Eosinophils Absolute: 0.1 10*3/uL (ref 0.0–0.7)
Eosinophils Relative: 2.6 % (ref 0.0–5.0)
HCT: 42.9 % (ref 36.0–46.0)
Hemoglobin: 14.2 g/dL (ref 12.0–15.0)
Lymphocytes Relative: 37.8 % (ref 12.0–46.0)
Lymphs Abs: 1.8 10*3/uL (ref 0.7–4.0)
MCHC: 33.2 g/dL (ref 30.0–36.0)
MCV: 89.5 fl (ref 78.0–100.0)
Monocytes Absolute: 0.5 10*3/uL (ref 0.1–1.0)
Monocytes Relative: 11.2 % (ref 3.0–12.0)
Neutro Abs: 2.3 10*3/uL (ref 1.4–7.7)
Neutrophils Relative %: 48.1 % (ref 43.0–77.0)
Platelets: 160 10*3/uL (ref 150.0–400.0)
RBC: 4.8 Mil/uL (ref 3.87–5.11)
RDW: 13.3 % (ref 11.5–15.5)
WBC: 4.9 10*3/uL (ref 4.0–10.5)

## 2019-06-24 LAB — LIPID PANEL
Cholesterol: 201 mg/dL — ABNORMAL HIGH (ref 0–200)
HDL: 52 mg/dL (ref >39.00)
LDL Cholesterol: 114 mg/dL — ABNORMAL HIGH (ref 0–99)
NonHDL: 148.86
Total CHOL/HDL Ratio: 4
Triglycerides: 172 mg/dL — ABNORMAL HIGH (ref 0.0–149.0)
VLDL: 34.4 mg/dL (ref 0.0–40.0)

## 2019-06-24 LAB — VITAMIN B12: Vitamin B-12: >1500 pg/mL — ABNORMAL HIGH (ref 211–911)

## 2019-06-24 LAB — MAGNESIUM: Magnesium: 2.1 mg/dL (ref 1.5–2.5)

## 2019-06-24 LAB — TSH: TSH: 1.25 u[IU]/mL (ref 0.35–4.50)

## 2019-06-24 LAB — VITAMIN D 25 HYDROXY (VIT D DEFICIENCY, FRACTURES): VITD: 27.26 ng/mL — ABNORMAL LOW (ref 30.00–100.00)

## 2019-06-24 MED ORDER — PREGABALIN 25 MG PO CAPS: 25.0000 mg | ORAL_CAPSULE | Freq: Two times a day (BID) | ORAL | 0 refills | Status: DC

## 2019-06-24 MED FILL — DIVALPROEX SOD DR 500 MG TA: 500 | 30 days supply | Qty: 60 | Fill #2

## 2019-06-24 MED FILL — PREGABALIN 25 MG CAPS: 25 | 30 days supply | Qty: 60 | Fill #0

## 2019-06-24 NOTE — Assessment & Plan Note (Signed)
Reviewed preventive care protocols, scheduled due services, and updated immunizations Discussed nutrition, exercise, diet, and healthy lifestyle.  She wants to talk to her oncologist before receiving influenza or shingles vaccines.

## 2019-06-24 NOTE — Assessment & Plan Note (Signed)
On catapress and metoprolol for migraine prevention,palpitation and HTN.

## 2019-06-24 NOTE — Assessment & Plan Note (Signed)
Rx printed for Lyrica 25 mg twice daily. She will discuss with oncologist and neurologist.

## 2019-06-24 NOTE — Patient Instructions (Addendum)
Great to see you, [name] [name]!  I will call you with your lab results from today and you can view them online.   Ask your neurologist/oncologist about lyrica.

## 2019-06-24 NOTE — Assessment & Plan Note (Signed)
Continue current rxs.  No changes made.

## 2019-06-24 NOTE — Assessment & Plan Note (Signed)
Unfortunately progressing.  Followed by Dr. Maylon Peppers at [facility].

## 2019-06-25 LAB — HEPATITIS C ANTIBODY
Hepatitis C Ab: NONREACTIVE
SIGNAL TO CUT-OFF: 0.01 (ref <1.00)

## 2019-06-25 LAB — HIV ANTIBODY (ROUTINE TESTING W REFLEX): HIV 1&2 Ab, 4th Generation: NONREACTIVE

## 2019-07-02 DIAGNOSIS — Z5111 Encounter for antineoplastic chemotherapy: Secondary | ICD-10-CM | POA: Diagnosis not present

## 2019-07-02 DIAGNOSIS — C718 Malignant neoplasm of overlapping sites of brain: Secondary | ICD-10-CM | POA: Diagnosis not present

## 2019-07-14 ENCOUNTER — Ambulatory Visit: Payer: Self-pay | Admitting: Neurology

## 2019-07-16 ENCOUNTER — Ambulatory Visit: Payer: 59

## 2019-07-16 DIAGNOSIS — Z5111 Encounter for antineoplastic chemotherapy: Secondary | ICD-10-CM | POA: Diagnosis not present

## 2019-07-16 DIAGNOSIS — C718 Malignant neoplasm of overlapping sites of brain: Secondary | ICD-10-CM | POA: Diagnosis not present

## 2019-07-17 ENCOUNTER — Other Ambulatory Visit: Payer: Self-pay | Admitting: Neurology

## 2019-07-17 MED FILL — RHOFADE 1 % CREA: 1 | 30 days supply | Qty: 30 | Fill #0

## 2019-07-17 MED FILL — PANTOPRAZOLE SOD DR 40 MG T: 40 | 90 days supply | Qty: 90 | Fill #1

## 2019-07-17 MED FILL — CloNIDine HCL 0.1 MG TAB: 0.1 | 37 days supply | Qty: 60 | Fill #0

## 2019-07-20 MED FILL — DIVALPROEX SOD DR 500 MG TA: 500 | 30 days supply | Qty: 60 | Fill #0

## 2019-07-30 DIAGNOSIS — C712 Malignant neoplasm of temporal lobe: Secondary | ICD-10-CM | POA: Diagnosis not present

## 2019-07-30 DIAGNOSIS — R519 Headache, unspecified: Secondary | ICD-10-CM | POA: Diagnosis not present

## 2019-07-30 DIAGNOSIS — R5383 Other fatigue: Secondary | ICD-10-CM | POA: Diagnosis not present

## 2019-07-30 DIAGNOSIS — Z9889 Other specified postprocedural states: Secondary | ICD-10-CM | POA: Diagnosis not present

## 2019-07-30 DIAGNOSIS — Z006 Encounter for examination for normal comparison and control in clinical research program: Secondary | ICD-10-CM | POA: Diagnosis not present

## 2019-07-30 DIAGNOSIS — F419 Anxiety disorder, unspecified: Secondary | ICD-10-CM | POA: Diagnosis not present

## 2019-07-30 DIAGNOSIS — C713 Malignant neoplasm of parietal lobe: Secondary | ICD-10-CM | POA: Diagnosis not present

## 2019-07-30 MED FILL — DEXAMETHASONE 1 MG TABLET: 1 | 7 days supply | Qty: 30 | Fill #0

## 2019-07-30 MED FILL — LORazepam 1 MG TABS: 1 | 7 days supply | Qty: 30 | Fill #0

## 2019-08-06 DIAGNOSIS — C718 Malignant neoplasm of overlapping sites of brain: Secondary | ICD-10-CM | POA: Diagnosis not present

## 2019-08-06 DIAGNOSIS — Z5111 Encounter for antineoplastic chemotherapy: Secondary | ICD-10-CM | POA: Diagnosis not present

## 2019-08-12 ENCOUNTER — Other Ambulatory Visit: Payer: Self-pay

## 2019-08-12 ENCOUNTER — Ambulatory Visit
Admission: RE | Admit: 2019-08-12 | Discharge: 2019-08-12 | Disposition: A | Discharge status: Home / Self Care | Payer: 59 | Source: Ambulatory Visit | Attending: Obstetrics and Gynecology | Admitting: Obstetrics and Gynecology

## 2019-08-12 DIAGNOSIS — Z1231 Encounter for screening mammogram for malignant neoplasm of breast: Secondary | ICD-10-CM

## 2019-08-13 DIAGNOSIS — C719 Malignant neoplasm of brain, unspecified: Secondary | ICD-10-CM | POA: Diagnosis not present

## 2019-08-13 DIAGNOSIS — H5203 Hypermetropia, bilateral: Secondary | ICD-10-CM | POA: Diagnosis not present

## 2019-08-13 DIAGNOSIS — H524 Presbyopia: Secondary | ICD-10-CM | POA: Diagnosis not present

## 2019-08-21 MED FILL — HYDROCODON-APAP 5-325: 5-325 | 12 days supply | Qty: 50 | Fill #0

## 2019-08-27 DIAGNOSIS — C712 Malignant neoplasm of temporal lobe: Secondary | ICD-10-CM | POA: Diagnosis not present

## 2019-08-27 DIAGNOSIS — Z5111 Encounter for antineoplastic chemotherapy: Secondary | ICD-10-CM | POA: Diagnosis not present

## 2019-08-31 DIAGNOSIS — L719 Rosacea, unspecified: Secondary | ICD-10-CM | POA: Diagnosis not present

## 2019-08-31 MED FILL — RHOFADE 1 % CREA: 1 | 30 days supply | Qty: 30 | Fill #0

## 2019-08-31 MED FILL — CloNIDine HCL 0.1 MG TAB: 0.1 | 30 days supply | Qty: 30 | Fill #0

## 2019-09-07 MED FILL — MELOXICAM 7.5 MG TABLET: 7.5 | 30 days supply | Qty: 30 | Fill #1

## 2019-09-07 MED FILL — valACYclovir HCL 1 GM TABS: 1 | 1 days supply | Qty: 4 | Fill #1

## 2019-09-07 MED FILL — DEXAMETHASONE 1 MG TABLET: 1 | 7 days supply | Qty: 30 | Fill #1

## 2019-09-09 MED FILL — metroNIDAZOLE 1 % GEL: 1 | 30 days supply | Qty: 60 | Fill #0

## 2019-09-17 DIAGNOSIS — R413 Other amnesia: Secondary | ICD-10-CM | POA: Diagnosis not present

## 2019-09-17 DIAGNOSIS — C718 Malignant neoplasm of overlapping sites of brain: Secondary | ICD-10-CM | POA: Diagnosis not present

## 2019-09-17 DIAGNOSIS — R530 Neoplastic (malignant) related fatigue: Secondary | ICD-10-CM | POA: Diagnosis not present

## 2019-09-17 DIAGNOSIS — Z95828 Presence of other vascular implants and grafts: Secondary | ICD-10-CM | POA: Diagnosis not present

## 2019-09-17 DIAGNOSIS — C712 Malignant neoplasm of temporal lobe: Secondary | ICD-10-CM | POA: Diagnosis not present

## 2019-09-17 DIAGNOSIS — R5383 Other fatigue: Secondary | ICD-10-CM | POA: Diagnosis not present

## 2019-09-17 DIAGNOSIS — B001 Herpesviral vesicular dermatitis: Secondary | ICD-10-CM | POA: Diagnosis not present

## 2019-09-17 DIAGNOSIS — G43001 Migraine without aura, not intractable, with status migrainosus: Secondary | ICD-10-CM | POA: Diagnosis not present

## 2019-09-17 DIAGNOSIS — G43909 Migraine, unspecified, not intractable, without status migrainosus: Secondary | ICD-10-CM | POA: Diagnosis not present

## 2019-09-17 DIAGNOSIS — Z9884 Bariatric surgery status: Secondary | ICD-10-CM | POA: Diagnosis not present

## 2019-09-17 DIAGNOSIS — F419 Anxiety disorder, unspecified: Secondary | ICD-10-CM | POA: Diagnosis not present

## 2019-09-17 DIAGNOSIS — Z79899 Other long term (current) drug therapy: Secondary | ICD-10-CM | POA: Diagnosis not present

## 2019-09-17 DIAGNOSIS — H53462 Homonymous bilateral field defects, left side: Secondary | ICD-10-CM | POA: Diagnosis not present

## 2019-09-28 DIAGNOSIS — Z01419 Encounter for gynecological examination (general) (routine) without abnormal findings: Secondary | ICD-10-CM | POA: Diagnosis not present

## 2019-09-28 DIAGNOSIS — Z6841 Body Mass Index (BMI) 40.0 and over, adult: Secondary | ICD-10-CM | POA: Diagnosis not present

## 2019-10-06 DIAGNOSIS — C712 Malignant neoplasm of temporal lobe: Secondary | ICD-10-CM | POA: Diagnosis not present

## 2019-10-08 DIAGNOSIS — L719 Rosacea, unspecified: Secondary | ICD-10-CM | POA: Diagnosis not present

## 2019-10-08 DIAGNOSIS — Z79899 Other long term (current) drug therapy: Secondary | ICD-10-CM | POA: Diagnosis not present

## 2019-10-08 DIAGNOSIS — G43009 Migraine without aura, not intractable, without status migrainosus: Secondary | ICD-10-CM | POA: Diagnosis not present

## 2019-10-08 DIAGNOSIS — Z95828 Presence of other vascular implants and grafts: Secondary | ICD-10-CM | POA: Diagnosis not present

## 2019-10-08 DIAGNOSIS — R5383 Other fatigue: Secondary | ICD-10-CM | POA: Diagnosis not present

## 2019-10-08 DIAGNOSIS — G43909 Migraine, unspecified, not intractable, without status migrainosus: Secondary | ICD-10-CM | POA: Diagnosis not present

## 2019-10-08 DIAGNOSIS — C718 Malignant neoplasm of overlapping sites of brain: Secondary | ICD-10-CM | POA: Diagnosis not present

## 2019-10-08 DIAGNOSIS — F419 Anxiety disorder, unspecified: Secondary | ICD-10-CM | POA: Diagnosis not present

## 2019-10-08 DIAGNOSIS — K449 Diaphragmatic hernia without obstruction or gangrene: Secondary | ICD-10-CM | POA: Diagnosis not present

## 2019-10-08 DIAGNOSIS — R110 Nausea: Secondary | ICD-10-CM | POA: Diagnosis not present

## 2019-10-08 DIAGNOSIS — C712 Malignant neoplasm of temporal lobe: Secondary | ICD-10-CM | POA: Diagnosis not present

## 2019-10-08 DIAGNOSIS — Z9884 Bariatric surgery status: Secondary | ICD-10-CM | POA: Diagnosis not present

## 2019-10-08 DIAGNOSIS — B001 Herpesviral vesicular dermatitis: Secondary | ICD-10-CM | POA: Diagnosis not present

## 2019-10-08 MED FILL — HYDROCODON-APAP 5-325: 5-325 | 12 days supply | Qty: 50 | Fill #0

## 2019-10-08 MED FILL — ONDANSETRON HCL 8 MG TABLET: 8 | 20 days supply | Qty: 60 | Fill #0

## 2019-10-20 MED FILL — TEMOZOLOMIDE 20 MG CAPS: 20 | 5 days supply | Qty: 5 | Fill #0

## 2019-10-20 MED FILL — TEMOZOLOMIDE 100 MG CAPS: 100 | 5 days supply | Qty: 15 | Fill #0

## 2019-10-21 ENCOUNTER — Other Ambulatory Visit: Payer: Self-pay | Admitting: Neurology

## 2019-10-21 MED ORDER — DIVALPROEX SODIUM 500 MG PO DR TAB: 500.0000 mg | DELAYED_RELEASE_TABLET | Freq: Two times a day (BID) | ORAL | 1 refills | Status: DC

## 2019-10-21 MED FILL — DIVALPROEX SOD DR 500 MG TA: 500 | 90 days supply | Qty: 180 | Fill #0

## 2019-10-21 MED FILL — PANTOPRAZOLE SOD DR 40 MG T: 40 | 90 days supply | Qty: 90 | Fill #2

## 2019-10-21 NOTE — Telephone Encounter (Signed)
Patient called in and stated that she had a MRI at [facility] and it shows that her cancer in her brain has spread, she states she is currently in stage 4 and she is pretty certain she only has a couple of months to live. She wants to see Dr Jannifer Franklin for an appointment to look at the MRI that she done at [facility]. She states she feels as if she needs a refill of the Depakote to refrain from having seizures and making the cancer worse. But he know that she needs an appt but cant wait until April to be seen.  Pt is requesting a refill of divalproex (DEPAKOTE) 500 MG DR tablet, to be sent to [place], [place]

## 2019-10-21 NOTE — Addendum Note (Signed)
Addended by: Kathrynn Ducking on: 10/21/2019 10:31 AM   Modules accepted: Orders

## 2019-10-21 NOTE — Telephone Encounter (Signed)
I called the patient, review of the report of the MRI of the brain done recently does show some changes of enhancement with spread to the left hemisphere, the radiologist question whether this may have been in the field of radiation that occurred in June 2020.  This will certainly need to be followed over time.  I will send in a prescription for Depakote, the patient apparently does not have a revisit set up, I will try to get her seen in the next 2 or 3 months.   MR brain 10/06/19:  Result Impression    Increasing FLAIR and enhancement primarily within the splenium of the corpus callosum now crossing midline to the left, concerning for disease progression. Evolving post-treatment changes are possible though comparison with the margins of the 03/2019 radiation port would be helpful as progressive findings outside the radiation port would be highly concerning for disease progression. Attention on close-interval follow-up recommended. Mild elevated rCBV within the left aspect of the splenium is also noted.

## 2019-10-22 ENCOUNTER — Other Ambulatory Visit: Payer: Self-pay | Admitting: Family Medicine

## 2019-10-22 ENCOUNTER — Other Ambulatory Visit: Payer: Self-pay

## 2019-10-22 MED FILL — METOPROLOL TARTRATE 25 MG T: 25 | 90 days supply | Qty: 180 | Fill #0

## 2019-10-22 MED FILL — CloNIDine HCL 0.1 MG TAB: 0.1 | 30 days supply | Qty: 30 | Fill #1

## 2019-10-22 NOTE — Telephone Encounter (Signed)
TA-Pt requesting refill/last seen in Sept so she is UTD/Per [place] PMP pt is compliant without red flags/thx dmf

## 2019-10-23 MED FILL — PREGABALIN 25 MG CAPS: 25 | 30 days supply | Qty: 60 | Fill #0

## 2019-10-29 DIAGNOSIS — Z95828 Presence of other vascular implants and grafts: Secondary | ICD-10-CM | POA: Diagnosis not present

## 2019-10-29 DIAGNOSIS — Z8719 Personal history of other diseases of the digestive system: Secondary | ICD-10-CM | POA: Diagnosis not present

## 2019-10-29 DIAGNOSIS — G43909 Migraine, unspecified, not intractable, without status migrainosus: Secondary | ICD-10-CM | POA: Diagnosis not present

## 2019-10-29 DIAGNOSIS — L719 Rosacea, unspecified: Secondary | ICD-10-CM | POA: Diagnosis not present

## 2019-10-29 DIAGNOSIS — K219 Gastro-esophageal reflux disease without esophagitis: Secondary | ICD-10-CM | POA: Diagnosis not present

## 2019-10-29 DIAGNOSIS — C712 Malignant neoplasm of temporal lobe: Secondary | ICD-10-CM | POA: Diagnosis not present

## 2019-10-29 DIAGNOSIS — B001 Herpesviral vesicular dermatitis: Secondary | ICD-10-CM | POA: Diagnosis not present

## 2019-10-29 DIAGNOSIS — C718 Malignant neoplasm of overlapping sites of brain: Secondary | ICD-10-CM | POA: Diagnosis not present

## 2019-10-29 DIAGNOSIS — Z9889 Other specified postprocedural states: Secondary | ICD-10-CM | POA: Diagnosis not present

## 2019-10-29 DIAGNOSIS — Z9884 Bariatric surgery status: Secondary | ICD-10-CM | POA: Diagnosis not present

## 2019-10-29 DIAGNOSIS — Z79899 Other long term (current) drug therapy: Secondary | ICD-10-CM | POA: Diagnosis not present

## 2019-10-29 LAB — BASIC METABOLIC PANEL
BUN: 11 (ref 4–21)
CO2: 28 — AB (ref 13–22)
Chloride: 109 — AB (ref 99–108)
Creatinine: 0.8 (ref 0.5–1.1)
Glucose: 118
Potassium: 4 (ref 3.4–5.3)
Sodium: 143 (ref 137–147)

## 2019-10-29 LAB — CBC AND DIFFERENTIAL
HCT: 42 (ref 36–46)
Hemoglobin: 13.8 (ref 12.0–16.0)
Neutrophils Absolute: 2
Platelets: 139 — AB (ref 150–399)
WBC: 4.3

## 2019-10-29 LAB — COMPREHENSIVE METABOLIC PANEL
Albumin: 3.8 (ref 3.5–5.0)
Calcium: 9.2 (ref 8.7–10.7)
GFR calc non Af Amer: 77

## 2019-10-29 LAB — HEPATIC FUNCTION PANEL
ALT: 30 (ref 7–35)
AST: 35 (ref 13–35)
Alkaline Phosphatase: 54 (ref 25–125)
Bilirubin, Total: 0.4

## 2019-10-29 LAB — CBC: RBC: 4.83 (ref 3.87–5.11)

## 2019-10-30 DIAGNOSIS — H524 Presbyopia: Secondary | ICD-10-CM | POA: Diagnosis not present

## 2019-10-30 DIAGNOSIS — H5203 Hypermetropia, bilateral: Secondary | ICD-10-CM | POA: Diagnosis not present

## 2019-10-30 DIAGNOSIS — C712 Malignant neoplasm of temporal lobe: Secondary | ICD-10-CM | POA: Diagnosis not present

## 2019-11-09 ENCOUNTER — Encounter: Payer: Self-pay | Admitting: Family Medicine

## 2019-11-09 NOTE — Progress Notes (Signed)
[facility]-Hematology/Oncology/thx dmf

## 2019-11-19 DIAGNOSIS — Z9889 Other specified postprocedural states: Secondary | ICD-10-CM | POA: Diagnosis not present

## 2019-11-19 DIAGNOSIS — G43909 Migraine, unspecified, not intractable, without status migrainosus: Secondary | ICD-10-CM | POA: Diagnosis not present

## 2019-11-19 DIAGNOSIS — L719 Rosacea, unspecified: Secondary | ICD-10-CM | POA: Diagnosis not present

## 2019-11-19 DIAGNOSIS — R530 Neoplastic (malignant) related fatigue: Secondary | ICD-10-CM | POA: Diagnosis not present

## 2019-11-19 DIAGNOSIS — Z5111 Encounter for antineoplastic chemotherapy: Secondary | ICD-10-CM | POA: Diagnosis not present

## 2019-11-19 DIAGNOSIS — Z634 Disappearance and death of family member: Secondary | ICD-10-CM | POA: Diagnosis not present

## 2019-11-19 DIAGNOSIS — F419 Anxiety disorder, unspecified: Secondary | ICD-10-CM | POA: Diagnosis not present

## 2019-11-19 DIAGNOSIS — C718 Malignant neoplasm of overlapping sites of brain: Secondary | ICD-10-CM | POA: Diagnosis not present

## 2019-11-19 DIAGNOSIS — B001 Herpesviral vesicular dermatitis: Secondary | ICD-10-CM | POA: Diagnosis not present

## 2019-11-19 DIAGNOSIS — Z95828 Presence of other vascular implants and grafts: Secondary | ICD-10-CM | POA: Diagnosis not present

## 2019-11-19 DIAGNOSIS — Z79899 Other long term (current) drug therapy: Secondary | ICD-10-CM | POA: Diagnosis not present

## 2019-11-19 MED FILL — ALPRAZolam 0.25 MG TABS: 0.25 | 10 days supply | Qty: 30 | Fill #0

## 2019-11-19 MED FILL — TEMOZOLOMIDE 20 MG CAPS: 20 | 5 days supply | Qty: 5 | Fill #0

## 2019-11-19 MED FILL — SERTRALINE HCL 25 MG TABLET: 25 | 22 days supply | Qty: 30 | Fill #0

## 2019-11-19 MED FILL — TEMOZOLOMIDE 100 MG CAPS: 100 | 5 days supply | Qty: 15 | Fill #0

## 2019-11-19 MED FILL — DEXAMETHASONE 1 MG TABLET: 1 | 30 days supply | Qty: 30 | Fill #0

## 2019-11-27 ENCOUNTER — Other Ambulatory Visit (HOSPITAL_COMMUNITY)
Admission: RE | Admit: 2019-11-27 | Discharge: 2019-11-27 | Disposition: A | Discharge status: Home / Self Care | Payer: 59 | Source: Other Acute Inpatient Hospital | Attending: Medical | Admitting: Medical

## 2019-11-27 DIAGNOSIS — C712 Malignant neoplasm of temporal lobe: Secondary | ICD-10-CM | POA: Insufficient documentation

## 2019-11-27 LAB — CBC WITH DIFFERENTIAL/PLATELET
Abs Immature Granulocytes: 0.02 10*3/uL (ref 0.00–0.07)
Basophils Absolute: 0 10*3/uL (ref 0.0–0.1)
Basophils Relative: 0 %
Eosinophils Absolute: 0.2 10*3/uL (ref 0.0–0.5)
Eosinophils Relative: 4 %
HCT: 46.3 % — ABNORMAL HIGH (ref 36.0–46.0)
Hemoglobin: 14.4 g/dL (ref 12.0–15.0)
Immature Granulocytes: 0 %
Lymphocytes Relative: 36 %
Lymphs Abs: 2.1 10*3/uL (ref 0.7–4.0)
MCH: 29 pg (ref 26.0–34.0)
MCHC: 31.1 g/dL (ref 30.0–36.0)
MCV: 93.3 fL (ref 80.0–100.0)
Monocytes Absolute: 0.4 10*3/uL (ref 0.1–1.0)
Monocytes Relative: 7 %
Neutro Abs: 3 10*3/uL (ref 1.7–7.7)
Neutrophils Relative %: 53 %
Platelets: 149 10*3/uL — ABNORMAL LOW (ref 150–400)
RBC: 4.96 MIL/uL (ref 3.87–5.11)
RDW: 14.3 % (ref 11.5–15.5)
WBC: 5.7 10*3/uL (ref 4.0–10.5)
nRBC: 0 % (ref 0.0–0.2)

## 2019-12-04 MED FILL — CloNIDine HCL 0.1 MG TAB: 0.1 | 30 days supply | Qty: 30 | Fill #2

## 2019-12-10 DIAGNOSIS — F419 Anxiety disorder, unspecified: Secondary | ICD-10-CM | POA: Diagnosis not present

## 2019-12-10 DIAGNOSIS — C712 Malignant neoplasm of temporal lobe: Secondary | ICD-10-CM | POA: Diagnosis not present

## 2019-12-10 DIAGNOSIS — B001 Herpesviral vesicular dermatitis: Secondary | ICD-10-CM | POA: Diagnosis not present

## 2019-12-10 DIAGNOSIS — L719 Rosacea, unspecified: Secondary | ICD-10-CM | POA: Diagnosis not present

## 2019-12-10 DIAGNOSIS — G43009 Migraine without aura, not intractable, without status migrainosus: Secondary | ICD-10-CM | POA: Diagnosis not present

## 2019-12-10 DIAGNOSIS — Z95828 Presence of other vascular implants and grafts: Secondary | ICD-10-CM | POA: Diagnosis not present

## 2019-12-10 DIAGNOSIS — C718 Malignant neoplasm of overlapping sites of brain: Secondary | ICD-10-CM | POA: Diagnosis not present

## 2019-12-10 DIAGNOSIS — Z5111 Encounter for antineoplastic chemotherapy: Secondary | ICD-10-CM | POA: Diagnosis not present

## 2019-12-10 DIAGNOSIS — Z9884 Bariatric surgery status: Secondary | ICD-10-CM | POA: Diagnosis not present

## 2019-12-10 DIAGNOSIS — Z8719 Personal history of other diseases of the digestive system: Secondary | ICD-10-CM | POA: Diagnosis not present

## 2019-12-10 DIAGNOSIS — R530 Neoplastic (malignant) related fatigue: Secondary | ICD-10-CM | POA: Diagnosis not present

## 2019-12-10 DIAGNOSIS — G43909 Migraine, unspecified, not intractable, without status migrainosus: Secondary | ICD-10-CM | POA: Diagnosis not present

## 2019-12-10 DIAGNOSIS — Z9889 Other specified postprocedural states: Secondary | ICD-10-CM | POA: Diagnosis not present

## 2019-12-10 MED FILL — SERTRALINE HCL 50 MG TABLET: 50 | 30 days supply | Qty: 30 | Fill #0

## 2019-12-14 MED FILL — PREGABALIN 25 MG CAPS: 25 | 30 days supply | Qty: 60 | Fill #1

## 2019-12-24 DIAGNOSIS — C712 Malignant neoplasm of temporal lobe: Secondary | ICD-10-CM | POA: Diagnosis not present

## 2019-12-28 DIAGNOSIS — Z9884 Bariatric surgery status: Secondary | ICD-10-CM | POA: Diagnosis not present

## 2019-12-28 DIAGNOSIS — G43909 Migraine, unspecified, not intractable, without status migrainosus: Secondary | ICD-10-CM | POA: Diagnosis not present

## 2019-12-28 DIAGNOSIS — C712 Malignant neoplasm of temporal lobe: Secondary | ICD-10-CM | POA: Diagnosis not present

## 2019-12-28 DIAGNOSIS — L719 Rosacea, unspecified: Secondary | ICD-10-CM | POA: Diagnosis not present

## 2019-12-28 DIAGNOSIS — Z7952 Long term (current) use of systemic steroids: Secondary | ICD-10-CM | POA: Diagnosis not present

## 2019-12-28 DIAGNOSIS — C718 Malignant neoplasm of overlapping sites of brain: Secondary | ICD-10-CM | POA: Diagnosis not present

## 2019-12-28 DIAGNOSIS — R110 Nausea: Secondary | ICD-10-CM | POA: Diagnosis not present

## 2019-12-28 DIAGNOSIS — Z79899 Other long term (current) drug therapy: Secondary | ICD-10-CM | POA: Diagnosis not present

## 2019-12-28 DIAGNOSIS — Z5111 Encounter for antineoplastic chemotherapy: Secondary | ICD-10-CM | POA: Diagnosis not present

## 2019-12-28 DIAGNOSIS — B001 Herpesviral vesicular dermatitis: Secondary | ICD-10-CM | POA: Diagnosis not present

## 2019-12-28 DIAGNOSIS — Z95828 Presence of other vascular implants and grafts: Secondary | ICD-10-CM | POA: Diagnosis not present

## 2019-12-28 DIAGNOSIS — F4321 Adjustment disorder with depressed mood: Secondary | ICD-10-CM | POA: Diagnosis not present

## 2019-12-29 MED FILL — GRANISETRON HCL 1 MG TABS: 1 | 30 days supply | Qty: 8 | Fill #0

## 2019-12-29 MED FILL — TEMOZOLOMIDE 100 MG CAPS: 100 | 5 days supply | Qty: 15 | Fill #0

## 2019-12-29 MED FILL — HYDROCODON-APAP 5-325: 5-325 | 13 days supply | Qty: 50 | Fill #0

## 2019-12-29 MED FILL — TEMOZOLOMIDE 20 MG CAPS: 20 | 5 days supply | Qty: 5 | Fill #0

## 2020-01-01 ENCOUNTER — Other Ambulatory Visit (HOSPITAL_COMMUNITY)
Admission: RE | Admit: 2020-01-01 | Discharge: 2020-01-01 | Disposition: A | Discharge status: Home / Self Care | Payer: 59 | Source: Ambulatory Visit | Attending: Medical Oncology | Admitting: Medical Oncology

## 2020-01-01 DIAGNOSIS — C712 Malignant neoplasm of temporal lobe: Secondary | ICD-10-CM | POA: Insufficient documentation

## 2020-01-01 LAB — CBC WITH DIFFERENTIAL/PLATELET
Abs Immature Granulocytes: 0.01 10*3/uL (ref 0.00–0.07)
Basophils Absolute: 0 10*3/uL (ref 0.0–0.1)
Basophils Relative: 0 %
Eosinophils Absolute: 0.2 10*3/uL (ref 0.0–0.5)
Eosinophils Relative: 4 %
HCT: 45.3 % (ref 36.0–46.0)
Hemoglobin: 14.2 g/dL (ref 12.0–15.0)
Immature Granulocytes: 0 %
Lymphocytes Relative: 35 %
Lymphs Abs: 1.7 10*3/uL (ref 0.7–4.0)
MCH: 29.5 pg (ref 26.0–34.0)
MCHC: 31.3 g/dL (ref 30.0–36.0)
MCV: 94.2 fL (ref 80.0–100.0)
Monocytes Absolute: 0.5 10*3/uL (ref 0.1–1.0)
Monocytes Relative: 9 %
Neutro Abs: 2.5 10*3/uL (ref 1.7–7.7)
Neutrophils Relative %: 52 %
Platelets: 135 10*3/uL — ABNORMAL LOW (ref 150–400)
RBC: 4.81 MIL/uL (ref 3.87–5.11)
RDW: 14.2 % (ref 11.5–15.5)
WBC: 4.8 10*3/uL (ref 4.0–10.5)
nRBC: 0 % (ref 0.0–0.2)

## 2020-01-04 ENCOUNTER — Other Ambulatory Visit (HOSPITAL_COMMUNITY)
Admission: RE | Admit: 2020-01-04 | Discharge: 2020-01-04 | Disposition: A | Discharge status: Home / Self Care | Payer: 59 | Source: Ambulatory Visit

## 2020-01-04 DIAGNOSIS — C712 Malignant neoplasm of temporal lobe: Secondary | ICD-10-CM | POA: Diagnosis not present

## 2020-01-04 LAB — CBC WITH DIFFERENTIAL/PLATELET
Abs Immature Granulocytes: 0.01 10*3/uL (ref 0.00–0.07)
Basophils Absolute: 0 10*3/uL (ref 0.0–0.1)
Basophils Relative: 0 %
Eosinophils Absolute: 0.2 10*3/uL (ref 0.0–0.5)
Eosinophils Relative: 4 %
HCT: 48.2 % — ABNORMAL HIGH (ref 36.0–46.0)
Hemoglobin: 14.9 g/dL (ref 12.0–15.0)
Immature Granulocytes: 0 %
Lymphocytes Relative: 42 %
Lymphs Abs: 1.9 10*3/uL (ref 0.7–4.0)
MCH: 28.9 pg (ref 26.0–34.0)
MCHC: 30.9 g/dL (ref 30.0–36.0)
MCV: 93.6 fL (ref 80.0–100.0)
Monocytes Absolute: 0.4 10*3/uL (ref 0.1–1.0)
Monocytes Relative: 9 %
Neutro Abs: 2 10*3/uL (ref 1.7–7.7)
Neutrophils Relative %: 45 %
Platelets: 156 10*3/uL (ref 150–400)
RBC: 5.15 MIL/uL — ABNORMAL HIGH (ref 3.87–5.11)
RDW: 14.4 % (ref 11.5–15.5)
WBC: 4.5 10*3/uL (ref 4.0–10.5)
nRBC: 0 % (ref 0.0–0.2)

## 2020-01-05 ENCOUNTER — Encounter: Payer: Self-pay | Admitting: Neurology

## 2020-01-05 ENCOUNTER — Other Ambulatory Visit: Payer: Self-pay

## 2020-01-05 ENCOUNTER — Ambulatory Visit: Payer: 59 | Admitting: Neurology

## 2020-01-05 VITALS — BP 130/72 | HR 78 | Temp 97.8°F | Wt 217.5 lb

## 2020-01-05 DIAGNOSIS — G43709 Chronic migraine without aura, not intractable, without status migrainosus: Secondary | ICD-10-CM

## 2020-01-05 DIAGNOSIS — C719 Malignant neoplasm of brain, unspecified: Secondary | ICD-10-CM

## 2020-01-05 MED ORDER — AIMOVIG 140 MG/ML ~~LOC~~ SOAJ: 140.0000 mg | SUBCUTANEOUS | 4 refills | Status: DC

## 2020-01-05 MED FILL — ONDANSETRON HCL 8 MG TABLET: 8 | 20 days supply | Qty: 60 | Fill #1

## 2020-01-05 MED FILL — SERTRALINE HCL 50 MG TABLET: 50 | 30 days supply | Qty: 30 | Fill #1

## 2020-01-05 MED FILL — CloNIDine HCL 0.1 MG TAB: 0.1 | 30 days supply | Qty: 30 | Fill #3

## 2020-01-05 NOTE — Progress Notes (Signed)
Reason for visit: Glioblastoma, migraine headache  [name] [name] is an 59 y.o. female  History of present illness:  [name] [name] is a 59 year old right-handed white female with a history of glioblastoma.  The patient has had surgery done in January 2020 and had a gamma knife procedure in June 2020.  The patient had a recent MRI of the brain several weeks ago that showed some recurrence of the tumor.  Following the surgery and January 2020 the patient developed a dense left homonymous visual field deficit.  The patient is not had any falls but her balance is slightly off.  She is continue to have frequent headaches, she is having headaches every day but her severe migraine headaches occur when there are weather changes.  The patient is on chemotherapy for her tumor including Avastin and Temodar.  In the past, the patient has been on Topamax, amitriptyline and she currently is on Depakote without benefit with the headaches.  She is also on Lyrica and metoprolol.  She takes Maxalt if needed for the headache.  Past Medical History:  Diagnosis Date  . Anemia   . Bladder spasms   . Brain cancer ([place]) 10/09/05   glioblastoma right temp/occipital  . Complication of anesthesia   . DDD (degenerative disc disease), lumbar   . Depression    Migraines  . GERD (gastroesophageal reflux disease)   . Glioblastoma multiforme ([place]) 2007   Followed by Dr. Vedia [name] at [facility]  . Hypertension    benign  . Microcytic anemia    iron def  2ndary chronic blood loss  . Migraine, unspecified, without mention of intractable migraine without mention of status migrainosus   . Obesity   . Overweight(278.02)   . PVC (premature ventricular contraction)   . Rash    occasional recurrent  . Tachycardia, unspecified   . Viral meningitis     Past Surgical History:  Procedure Laterality Date  . ABDOMINAL HYSTERECTOMY    . BACK SURGERY     lumbar  . BILATERAL SALPINGOOPHORECTOMY    . BLADDER SURGERY       tack  . BRAIN SURGERY  2014   resection of glioblastoma multiform  . CESAREAN SECTION     x 2   . CRANIOTOMY Right 11/07/2018  . CYSTOCELE REPAIR    . gamma knife     tx of brain tumor  . LAPAROSCOPIC GASTRIC SLEEVE RESECTION WITH HIATAL HERNIA REPAIR N/A 09/06/2015   Procedure: LAPAROSCOPIC GASTRIC SLEEVE RESECTION WITH HIATAL HERNIA REPAIR;  Surgeon: [name] Hausen, MD;  Location: [facility];  Service: General;  Laterality: N/A;  . PORTACATH PLACEMENT    . UPPER GI ENDOSCOPY  09/06/2015   Procedure: UPPER GI ENDOSCOPY;  Surgeon: [name] Hausen, MD;  Location: [facility];  Service: General;;    Family History  Problem Relation Age of Onset  . Cancer Other        tongue  . Heart disease Mother        bypass age 20  . Heart disease Father        heart failure, atrial fib  . Diabetes Father   . Liver disease Brother   . Diabetes Brother   . Diabetes Sister   . Motor neuron disease Brother   . Colon cancer Neg Hx   . Breast cancer Neg Hx     Social history:  reports that she has never smoked. She has never used smokeless tobacco. She reports that she does not drink  alcohol or use drugs.    Allergies  Allergen Reactions  . Shrimp [Shellfish Allergy] Nausea And Vomiting  . Morphine Other (See Comments)    Bladder Spasms (intolerance) Tolerates dilaudid, fentanyl, oxycodone, hydrocodone  . Latex Rash and Other (See Comments)    Blistering of skin  . Pseudoephedrine Palpitations and Other (See Comments)    Tolerates XR formulation  . Red Dye Hives  . Silver Rash and Other (See Comments)    Tegaderm - if left on for > 24 hr    Medications:  Prior to Admission medications   Medication Sig Start Date End Date Taking? Authorizing Provider  ALPRAZolam [name] [name]) 0.25 MG tablet Take by mouth. 11/19/19  Yes [provider]  Calcium Citrate-Vitamin D (CALCIUM CITRATE + PO) Take 1 tablet by mouth 3 (three) times daily.   Yes [provider]  cloNIDine (CATAPRES) 0.1  MG tablet Take 0.1 mg by mouth daily.    Yes [provider]  cyclobenzaprine (FLEXERIL) 10 MG tablet Take 10 mg by mouth 3 (three) times daily as needed for muscle spasms.   Yes [provider]  divalproex (DEPAKOTE) 500 MG DR tablet Take 1 tablet (500 mg total) by mouth 2 (two) times daily. 10/21/19  Yes [name] Ducking, MD  HYDROcodone-acetaminophen (NORCO/VICODIN) 5-325 MG tablet Take 1 tablet by mouth every 6 (six) hours as needed for moderate pain. 02/25/19  Yes [name] Ducking, MD  magnesium oxide (MAG-OX) 400 MG tablet Take 400 mg by mouth 2 (two) times daily.    Yes [provider]  meloxicam (MOBIC) 7.5 MG tablet Take 7.5 mg by mouth daily.   Yes [provider]  metoprolol tartrate (LOPRESSOR) 25 MG tablet TAKE 1 TABLET BY MOUTH TWICE DAILY Patient taking differently: Take 25 mg by mouth daily.  05/28/14  Yes [name] Passy, MD  metroNIDAZOLE (METROGEL) 1 % gel Apply 1 application topically daily. For rosacea with papular breakouts. 04/13/16  Yes [provider]  ondansetron (ZOFRAN) 8 MG tablet Take 8 mg by mouth 3 (three) times daily. 01/05/20  Yes [provider]  oxyCODONE-acetaminophen (PERCOCET/ROXICET) 5-325 MG tablet Take 1 tablet by mouth every 6 (six) hours as needed (migraine refractory to triptans and Vicodin). 02/25/19  Yes [name] Ducking, MD  Oxymetazoline HCl (RHOFADE) 1 % CREA Apply 1 application topically as needed.   Yes [provider]  pantoprazole (PROTONIX) 40 MG tablet TAKE 1 TABLET BY MOUTH ONCE DAILY 10/18/15  Yes [name] Passy, MD  Pediatric Multiple Vitamins (CHEWABLE MULTIPLE VITAMINS PO) Take 1 tablet by mouth 2 (two) times daily.    Yes [provider]  pregabalin (LYRICA) 25 MG capsule TAKE 1 CAPSULE BY MOUTH TWICE DAILY 10/23/19  Yes [name] Passy, MD  rizatriptan (MAXALT) 10 MG tablet TAKE 1 TABLET BY MOUTH TWICE DAILY AS NEEDED FOR MIGRAINE, MAY REPEAT IN 2 HOURS IF NEEDED 03/31/19  Yes  [name] Ducking, MD  sertraline (ZOLOFT) 50 MG tablet Take by mouth. 12/10/19  Yes [provider]  temozolomide (TEMODAR) 100 MG capsule TAKE 3 (100 MG) CAPSULES AND 1 (20 MG) CAPSULE BY MOUTH DAILY FOR 5 DAYS 12/28/19  Yes [provider]  valACYclovir (VALTREX) 1000 MG tablet As needed for cold sores 02/13/18  Yes [provider]  vitamin B-12 (CYANOCOBALAMIN) 250 MCG tablet Take 250 mcg by mouth daily.   Yes [provider]    ROS:  Out of a complete 14 system review of symptoms, the  patient complains only of the following symptoms, and all other reviewed systems are negative.  Migraine headache Vision change Walking difficulty  Blood pressure 130/72, pulse 78, temperature 97.8 F (36.6 C), weight 217 lb 8 oz (98.7 kg).  Physical Exam  General: The patient is alert and cooperative at the time of the examination.  The patient is moderately to markedly obese.  Skin: No significant peripheral edema is noted.   Neurologic Exam  Mental status: The patient is alert and oriented x 3 at the time of the examination. The patient has apparent normal recent and remote memory, with an apparently normal attention span and concentration ability.   Cranial nerves: Facial symmetry is present. Speech is normal, no aphasia or dysarthria is noted. Extraocular movements are full. Visual fields are notable for dense left homonymous visual field deficit.  Motor: The patient has good strength in all 4 extremities.  Sensory examination: Soft touch sensation is symmetric on the face, arms, and legs.  Coordination: The patient has good finger-nose-finger and heel-to-shin bilaterally.  Gait and station: The patient has a normal gait. Tandem gait is slightly unsteady. Romberg is negative. No drift is seen.  Reflexes: Deep tendon reflexes are symmetric.   MRI brain 12/24/19:  Result Impression    1. Slightly progressed FLAIR signal and thickening in the  posterior corpus callosum with new restricted diffusion along the posterior body of the corpus callosum concerning for disease progression. 2. Slight interval decreased enhancement in the splenium, right thalamus, and superior margin of the resection cavity likely related to antiangiogenic treatment effects. 3. Stable meningiomas of bilateral sphenoid wings. 4. No acute hemorrhage, infarct, or mass effect. 5. Nonspecific FLAIR hyperintensity in the left hippocampus which is unchanged and could be related to treatment changes. Correlation for possible seizures could be helpful. 6. Stable enhancing focus in the right cerebellar hemisphere, still potentially concerning for multicentric GBM but not progressed. Attention on followup.      Assessment/Plan:  1.  Glioblastoma  2.  Intractable migraine headache  The patient has not been doing well with headache control recently.  She will remain on Depakote, we will add Aimovig to the regimen.  The patient will follow-up in 6 months.  Jill Alexanders MD 01/05/2020 12:42 PM  [facility] [address] [place], [place] [address]  Phone [phone] Fax [phone]

## 2020-01-07 ENCOUNTER — Telehealth: Payer: Self-pay

## 2020-01-07 NOTE — Telephone Encounter (Signed)
PA for aimovig sent to insurance on cover my meds.

## 2020-01-09 MED FILL — AIMOVIG 140 MG/ML SOAJ: 140 | 30 days supply | Qty: 1 | Fill #0

## 2020-01-14 NOTE — Telephone Encounter (Signed)
Receive fax from [place] that [place] was approve from 01/14/2020 to 07/14/2020. One injection per 30 days. Contact number is  [phone].

## 2020-01-19 DIAGNOSIS — Z79899 Other long term (current) drug therapy: Secondary | ICD-10-CM | POA: Diagnosis not present

## 2020-01-19 DIAGNOSIS — Z5111 Encounter for antineoplastic chemotherapy: Secondary | ICD-10-CM | POA: Diagnosis not present

## 2020-01-19 DIAGNOSIS — C718 Malignant neoplasm of overlapping sites of brain: Secondary | ICD-10-CM | POA: Diagnosis not present

## 2020-01-25 DIAGNOSIS — F4321 Adjustment disorder with depressed mood: Secondary | ICD-10-CM | POA: Diagnosis not present

## 2020-01-25 DIAGNOSIS — R530 Neoplastic (malignant) related fatigue: Secondary | ICD-10-CM | POA: Diagnosis not present

## 2020-01-25 DIAGNOSIS — R5382 Chronic fatigue, unspecified: Secondary | ICD-10-CM | POA: Diagnosis not present

## 2020-01-25 DIAGNOSIS — R519 Headache, unspecified: Secondary | ICD-10-CM | POA: Diagnosis not present

## 2020-01-25 DIAGNOSIS — C712 Malignant neoplasm of temporal lobe: Secondary | ICD-10-CM | POA: Diagnosis not present

## 2020-01-25 MED FILL — METHYLPHENIDATE HCL 5 MG TA: 5 | 30 days supply | Qty: 30 | Fill #0

## 2020-01-25 MED FILL — TEMOZOLOMIDE 100 MG CAPS: 100 | 5 days supply | Qty: 15 | Fill #0

## 2020-01-25 MED FILL — TEMOZOLOMIDE 20 MG CAPS: 20 | 5 days supply | Qty: 5 | Fill #0

## 2020-01-27 MED FILL — AIMOVIG 140 MG/ML SOAJ: 140 | 30 days supply | Qty: 1 | Fill #1

## 2020-01-27 MED FILL — PREGABALIN 25 MG CAPS: 25 | 30 days supply | Qty: 60 | Fill #2

## 2020-01-27 MED FILL — GRANISETRON HCL 1 MG TABS: 1 | 30 days supply | Qty: 8 | Fill #1

## 2020-01-27 MED FILL — DIVALPROEX SOD DR 500 MG TA: 500 | 90 days supply | Qty: 180 | Fill #1

## 2020-01-27 MED FILL — MELOXICAM 7.5 MG TABLET: 7.5 | 30 days supply | Qty: 30 | Fill #0

## 2020-01-29 MED FILL — SERTRALINE HCL 50 MG TABLET: 50 | 30 days supply | Qty: 30 | Fill #2

## 2020-01-29 MED FILL — CloNIDine HCL 0.1 MG TAB: 0.1 | 30 days supply | Qty: 30 | Fill #4

## 2020-02-11 DIAGNOSIS — Z5111 Encounter for antineoplastic chemotherapy: Secondary | ICD-10-CM | POA: Diagnosis not present

## 2020-02-11 DIAGNOSIS — C712 Malignant neoplasm of temporal lobe: Secondary | ICD-10-CM | POA: Diagnosis not present

## 2020-02-29 DIAGNOSIS — D320 Benign neoplasm of cerebral meninges: Secondary | ICD-10-CM | POA: Diagnosis not present

## 2020-02-29 DIAGNOSIS — C712 Malignant neoplasm of temporal lobe: Secondary | ICD-10-CM | POA: Diagnosis not present

## 2020-03-03 DIAGNOSIS — F329 Major depressive disorder, single episode, unspecified: Secondary | ICD-10-CM | POA: Diagnosis not present

## 2020-03-03 DIAGNOSIS — R530 Neoplastic (malignant) related fatigue: Secondary | ICD-10-CM | POA: Diagnosis not present

## 2020-03-03 DIAGNOSIS — M62838 Other muscle spasm: Secondary | ICD-10-CM | POA: Diagnosis not present

## 2020-03-03 DIAGNOSIS — F4321 Adjustment disorder with depressed mood: Secondary | ICD-10-CM | POA: Diagnosis not present

## 2020-03-03 DIAGNOSIS — F419 Anxiety disorder, unspecified: Secondary | ICD-10-CM | POA: Diagnosis not present

## 2020-03-03 DIAGNOSIS — Z5111 Encounter for antineoplastic chemotherapy: Secondary | ICD-10-CM | POA: Diagnosis not present

## 2020-03-03 DIAGNOSIS — R519 Headache, unspecified: Secondary | ICD-10-CM | POA: Diagnosis not present

## 2020-03-03 DIAGNOSIS — H53462 Homonymous bilateral field defects, left side: Secondary | ICD-10-CM | POA: Diagnosis not present

## 2020-03-03 DIAGNOSIS — R5383 Other fatigue: Secondary | ICD-10-CM | POA: Diagnosis not present

## 2020-03-03 DIAGNOSIS — D696 Thrombocytopenia, unspecified: Secondary | ICD-10-CM | POA: Diagnosis not present

## 2020-03-03 DIAGNOSIS — C712 Malignant neoplasm of temporal lobe: Secondary | ICD-10-CM | POA: Diagnosis not present

## 2020-03-03 MED FILL — oxyCODONE HCL 5 MG TABS: 5 | 1 days supply | Qty: 10 | Fill #0

## 2020-03-03 MED FILL — CYCLOBENZAPRINE HCL 10 MG T: 10 | 10 days supply | Qty: 30 | Fill #0

## 2020-03-03 MED FILL — METHYLPHENIDATE HCL 5 MG TA: 5 | 30 days supply | Qty: 60 | Fill #0

## 2020-03-03 MED FILL — SERTRALINE HCL 50 MG TABS: 50 | 30 days supply | Qty: 30 | Fill #3

## 2020-03-03 MED FILL — AIMOVIG 140 MG/ML SOAJ: 140 | 30 days supply | Qty: 1 | Fill #2

## 2020-03-03 MED FILL — TEMOZOLOMIDE 100 MG CAPS: 100 | 5 days supply | Qty: 15 | Fill #0

## 2020-03-03 MED FILL — ONDANSETRON HCL 8 MG TABLET: 8 | 20 days supply | Qty: 60 | Fill #0

## 2020-03-03 MED FILL — PREGABALIN 25 MG CAPS: 25 | 30 days supply | Qty: 60 | Fill #3

## 2020-03-03 MED FILL — CloNIDine HCL 0.1 MG TAB: 0.1 | 30 days supply | Qty: 30 | Fill #5

## 2020-03-03 MED FILL — DEXAMETHASONE 1 MG TABLET: 1 | 30 days supply | Qty: 30 | Fill #0

## 2020-03-03 MED FILL — MELOXICAM 7.5 MG TABLET: 7.5 | 30 days supply | Qty: 30 | Fill #1

## 2020-03-03 MED FILL — HYDROCODON-APAP 5-325: 5-325 | 15 days supply | Qty: 60 | Fill #0

## 2020-03-03 MED FILL — METOPROLOL TARTRATE 25 MG T: 25 | 90 days supply | Qty: 180 | Fill #1

## 2020-03-03 MED FILL — PANTOPRAZOLE SOD DR 40 MG T: 40 | 90 days supply | Qty: 90 | Fill #3

## 2020-03-11 ENCOUNTER — Other Ambulatory Visit (HOSPITAL_COMMUNITY)
Admission: RE | Admit: 2020-03-11 | Discharge: 2020-03-11 | Disposition: A | Discharge status: Home / Self Care | Payer: 59 | Source: Ambulatory Visit | Attending: Medical Oncology | Admitting: Medical Oncology

## 2020-03-11 DIAGNOSIS — C712 Malignant neoplasm of temporal lobe: Secondary | ICD-10-CM | POA: Insufficient documentation

## 2020-03-11 LAB — CBC WITH DIFFERENTIAL/PLATELET
Abs Immature Granulocytes: 0.03 10*3/uL (ref 0.00–0.07)
Basophils Absolute: 0 10*3/uL (ref 0.0–0.1)
Basophils Relative: 0 %
Eosinophils Absolute: 0.1 10*3/uL (ref 0.0–0.5)
Eosinophils Relative: 1 %
HCT: 43.6 % (ref 36.0–46.0)
Hemoglobin: 13.7 g/dL (ref 12.0–15.0)
Immature Granulocytes: 1 %
Lymphocytes Relative: 28 %
Lymphs Abs: 1.5 10*3/uL (ref 0.7–4.0)
MCH: 29.7 pg (ref 26.0–34.0)
MCHC: 31.4 g/dL (ref 30.0–36.0)
MCV: 94.6 fL (ref 80.0–100.0)
Monocytes Absolute: 0.5 10*3/uL (ref 0.1–1.0)
Monocytes Relative: 9 %
Neutro Abs: 3.3 10*3/uL (ref 1.7–7.7)
Neutrophils Relative %: 61 %
Platelets: 133 10*3/uL — ABNORMAL LOW (ref 150–400)
RBC: 4.61 MIL/uL (ref 3.87–5.11)
RDW: 13.5 % (ref 11.5–15.5)
WBC: 5.3 10*3/uL (ref 4.0–10.5)
nRBC: 0 % (ref 0.0–0.2)

## 2020-03-24 DIAGNOSIS — Z5111 Encounter for antineoplastic chemotherapy: Secondary | ICD-10-CM | POA: Diagnosis not present

## 2020-03-24 DIAGNOSIS — C712 Malignant neoplasm of temporal lobe: Secondary | ICD-10-CM | POA: Diagnosis not present

## 2020-03-28 MED FILL — metroNIDAZOLE 1 % GEL: 1 | 30 days supply | Qty: 60 | Fill #1

## 2020-03-28 MED FILL — DEXAMETHASONE 1 MG TABLET: 1 | 30 days supply | Qty: 30 | Fill #1

## 2020-03-28 MED FILL — RHOFADE 1 % CREA: 1 | 30 days supply | Qty: 30 | Fill #1

## 2020-03-28 MED FILL — ONDANSETRON HCL 8 MG TABLET: 8 | 20 days supply | Qty: 60 | Fill #1

## 2020-03-28 MED FILL — AIMOVIG 140 MG/ML SOAJ: 140 | 30 days supply | Qty: 1 | Fill #3

## 2020-03-28 MED FILL — SERTRALINE HCL 50 MG TABS: 50 | 30 days supply | Qty: 30 | Fill #4

## 2020-03-28 MED FILL — PREGABALIN 25 MG CAPS: 25 | 30 days supply | Qty: 60 | Fill #4

## 2020-03-28 MED FILL — valACYclovir HCL 1 GM TABS: 1 | 1 days supply | Qty: 4 | Fill #0

## 2020-03-28 MED FILL — CYCLOBENZAPRINE HCL 10 MG T: 10 | 10 days supply | Qty: 30 | Fill #1

## 2020-03-28 MED FILL — CloNIDine HCL 0.1 MG TAB: 0.1 | 30 days supply | Qty: 30 | Fill #6

## 2020-03-29 MED FILL — TEMOZOLOMIDE 100 MG CAPS: 100 | 5 days supply | Qty: 15 | Fill #0

## 2020-03-31 ENCOUNTER — Other Ambulatory Visit (HOSPITAL_COMMUNITY)
Admit: 2020-03-31 | Discharge: 2020-03-31 | Disposition: A | Discharge status: Home / Self Care | Payer: 59 | Source: Ambulatory Visit | Attending: Medical Oncology | Admitting: Medical Oncology

## 2020-03-31 DIAGNOSIS — C719 Malignant neoplasm of brain, unspecified: Secondary | ICD-10-CM | POA: Insufficient documentation

## 2020-03-31 LAB — CBC WITH DIFFERENTIAL/PLATELET
Abs Immature Granulocytes: 0.02 10*3/uL (ref 0.00–0.07)
Basophils Absolute: 0 10*3/uL (ref 0.0–0.1)
Basophils Relative: 0 %
Eosinophils Absolute: 0.2 10*3/uL (ref 0.0–0.5)
Eosinophils Relative: 3 %
HCT: 45.4 % (ref 36.0–46.0)
Hemoglobin: 14.4 g/dL (ref 12.0–15.0)
Immature Granulocytes: 0 %
Lymphocytes Relative: 38 %
Lymphs Abs: 2.2 10*3/uL (ref 0.7–4.0)
MCH: 30.2 pg (ref 26.0–34.0)
MCHC: 31.7 g/dL (ref 30.0–36.0)
MCV: 95.2 fL (ref 80.0–100.0)
Monocytes Absolute: 0.6 10*3/uL (ref 0.1–1.0)
Monocytes Relative: 10 %
Neutro Abs: 2.9 10*3/uL (ref 1.7–7.7)
Neutrophils Relative %: 49 %
Platelets: 146 10*3/uL — ABNORMAL LOW (ref 150–400)
RBC: 4.77 MIL/uL (ref 3.87–5.11)
RDW: 13.8 % (ref 11.5–15.5)
WBC: 6 10*3/uL (ref 4.0–10.5)
nRBC: 0 % (ref 0.0–0.2)

## 2020-05-03 ENCOUNTER — Encounter: Payer: Self-pay | Admitting: Family Medicine

## 2020-05-03 ENCOUNTER — Ambulatory Visit (INDEPENDENT_AMBULATORY_CARE_PROVIDER_SITE_OTHER): Payer: Commercial Managed Care - PPO | Admitting: Family Medicine

## 2020-05-03 ENCOUNTER — Other Ambulatory Visit: Payer: Self-pay

## 2020-05-03 VITALS — BP 115/73 | HR 66 | Temp 97.2°F | Ht 63.5 in | Wt 211.5 lb

## 2020-05-03 DIAGNOSIS — L719 Rosacea, unspecified: Secondary | ICD-10-CM | POA: Diagnosis not present

## 2020-05-03 DIAGNOSIS — G43709 Chronic migraine without aura, not intractable, without status migrainosus: Secondary | ICD-10-CM | POA: Diagnosis not present

## 2020-05-03 DIAGNOSIS — C712 Malignant neoplasm of temporal lobe: Secondary | ICD-10-CM | POA: Diagnosis not present

## 2020-05-03 NOTE — Assessment & Plan Note (Signed)
Follows with Dr. Jannifer Franklin with neurology has both hydrocodone and oxycodone but endorses rare use of oxycodone only if hydrocodone does not improve. Also on Aimovig started in March 2021. Cont neurology care.

## 2020-05-03 NOTE — Progress Notes (Signed)
Subjective:     [name] [name] is a 59 y.o. female presenting for Establish Care     HPI  #GBM - initially diagnosed 2007 - has had 3 crainotomy and resections - most recent 2020 - Last MRI shows that it crossed the midline - currently watching and getting MRI every 2-3 months - follows at [facility] - doing treatment currently - Dr. Jannifer Franklin - manages the headaches and seizure prevention - on Ritalin due to chemo side effects - to help with energy  - oncologists - Dr. Maylon Peppers  #Migraine  - follows with Dr. Jannifer Franklin - has several different types of HA - tension HA and migraines - will take Meloxicam for tension - left eye/nausea - Migraine - Maxalt followed by hydrocodone then oxycodone (all prescribed by Dr. Jannifer Franklin) -- very rare use of the oxycodone  Sees Dr. Glyn Ade with GYN  Review of Systems   Social History   Tobacco Use  Smoking Status Never Smoker  Smokeless Tobacco Never Used        Objective:    BP Readings from Last 3 Encounters:  05/03/20 115/73  01/05/20 130/72  06/24/19 126/80   Wt Readings from Last 3 Encounters:  05/03/20 (!) 211 lb 8 oz (95.9 kg)  01/05/20 217 lb 8 oz (98.7 kg)  06/24/19 242 lb (109.8 kg)    BP 115/73   Pulse 66   Temp (!) 97.2 F (36.2 C) (Temporal)   Ht 5' 3.5" (1.613 m)   Wt (!) 211 lb 8 oz (95.9 kg)   SpO2 98%   BMI 36.88 kg/m    Physical Exam Constitutional:      General: She is not in acute distress.    Appearance: She is well-developed. She is not diaphoretic.  HENT:     Right Ear: External ear normal.     Left Ear: External ear normal.     Nose: Nose normal.  Eyes:     Conjunctiva/sclera: Conjunctivae normal.  Cardiovascular:     Rate and Rhythm: Normal rate.  Pulmonary:     Effort: Pulmonary effort is normal.  Musculoskeletal:     Cervical back: Neck supple.  Skin:    General: Skin is warm and dry.     Capillary Refill: Capillary refill takes less than 2 seconds.  Neurological:     Mental  Status: She is alert. Mental status is at baseline.  Psychiatric:        Mood and Affect: Mood normal.        Behavior: Behavior normal.           Assessment & Plan:   Problem List Items Addressed This Visit      Cardiovascular and Mediastinum   Migraine - Primary    Follows with Dr. Jannifer Franklin with neurology has both hydrocodone and oxycodone but endorses rare use of oxycodone only if hydrocodone does not improve. Also on Aimovig started in March 2021. Cont neurology care.        Relevant Medications   oxyCODONE (OXY IR/ROXICODONE) 5 MG immediate release tablet   HYDROcodone-acetaminophen (NORCO/VICODIN) 5-325 MG tablet     Nervous and Auditory   Glioblastoma multiforme of temporal lobe (HCC)    Recurrence of active disease in January. They are following closely with Dr. Maylon Peppers. Is on Depakote for seizure suppression (though no hx). Also on ritalin to help with chemo related low energy. Taking temozolomide.       Relevant Medications   dexamethasone (DECADRON) 1 MG tablet  granisetron (KYTRIL) 1 MG tablet     Musculoskeletal and Integument   Rosacea    Follows with dermatology. Stable on daily clonidine and oxymetazoline cream. Appreciate derm support          Return if symptoms worsen or fail to improve.  Lesleigh Noe, MD  This visit occurred during the SARS-CoV-2 public health emergency.  Safety protocols were in place, including screening questions prior to the visit, additional usage of staff PPE, and extensive cleaning of exam room while observing appropriate contact time as indicated for disinfecting solutions.

## 2020-05-03 NOTE — Assessment & Plan Note (Signed)
Follows with dermatology. Stable on daily clonidine and oxymetazoline cream. Appreciate derm support

## 2020-05-03 NOTE — Patient Instructions (Signed)
Great to meet you! 

## 2020-05-03 NOTE — Assessment & Plan Note (Signed)
Recurrence of active disease in January. They are following closely with Dr. Maylon Peppers. Is on Depakote for seizure suppression (though no hx). Also on ritalin to help with chemo related low energy. Taking temozolomide.

## 2020-05-16 ENCOUNTER — Other Ambulatory Visit: Payer: Self-pay | Admitting: Neurology

## 2020-05-16 MED FILL — SERTRALINE HCL 50 MG TABLET: 50 | 30 days supply | Qty: 30 | Fill #5

## 2020-05-16 MED FILL — CloNIDine HCL 0.1 MG TAB: 0.1 | 30 days supply | Qty: 30 | Fill #7

## 2020-05-17 ENCOUNTER — Other Ambulatory Visit (HOSPITAL_COMMUNITY)
Admission: RE | Admit: 2020-05-17 | Discharge: 2020-05-17 | Disposition: A | Discharge status: Home / Self Care | Payer: Commercial Managed Care - PPO | Source: Ambulatory Visit | Attending: Family Medicine | Admitting: Family Medicine

## 2020-05-17 DIAGNOSIS — C712 Malignant neoplasm of temporal lobe: Secondary | ICD-10-CM | POA: Diagnosis not present

## 2020-05-17 LAB — CBC WITH DIFFERENTIAL/PLATELET
Abs Immature Granulocytes: 0.01 K/uL (ref 0.00–0.07)
Basophils Absolute: 0 K/uL (ref 0.0–0.1)
Basophils Relative: 0 %
Eosinophils Absolute: 0.1 K/uL (ref 0.0–0.5)
Eosinophils Relative: 3 %
HCT: 43.9 % (ref 36.0–46.0)
Hemoglobin: 13.9 g/dL (ref 12.0–15.0)
Immature Granulocytes: 0 %
Lymphocytes Relative: 36 %
Lymphs Abs: 2 K/uL (ref 0.7–4.0)
MCH: 30.1 pg (ref 26.0–34.0)
MCHC: 31.7 g/dL (ref 30.0–36.0)
MCV: 95 fL (ref 80.0–100.0)
Monocytes Absolute: 0.5 K/uL (ref 0.1–1.0)
Monocytes Relative: 9 %
Neutro Abs: 2.9 K/uL (ref 1.7–7.7)
Neutrophils Relative %: 52 %
Platelets: 160 K/uL (ref 150–400)
RBC: 4.62 MIL/uL (ref 3.87–5.11)
RDW: 13.5 % (ref 11.5–15.5)
WBC: 5.5 K/uL (ref 4.0–10.5)
nRBC: 0 % (ref 0.0–0.2)

## 2020-05-17 MED FILL — DIVALPROEX SOD DR 500 MG TA: 500 | 30 days supply | Qty: 60 | Fill #0

## 2020-05-26 MED FILL — oxyCODONE HCL 5 MG TABS: 5 | 2 days supply | Qty: 10 | Fill #0

## 2020-05-26 MED FILL — METHYLPHENIDATE HCL 5 MG TA: 5 | 30 days supply | Qty: 60 | Fill #0

## 2020-05-26 MED FILL — HYDROCODON-APAP 5-325: 5-325 | 15 days supply | Qty: 60 | Fill #0

## 2020-06-27 ENCOUNTER — Other Ambulatory Visit: Payer: Self-pay | Admitting: *Deleted

## 2020-06-27 MED ORDER — PREGABALIN 25 MG PO CAPS: 25.0000 mg | ORAL_CAPSULE | Freq: Two times a day (BID) | ORAL | 3 refills | Status: AC

## 2020-06-27 MED FILL — DIVALPROEX SOD DR 500 MG TA: 500 | 30 days supply | Qty: 60 | Fill #0

## 2020-06-27 MED FILL — PREGABALIN 25 MG CAPS: 25 | 30 days supply | Qty: 60 | Fill #0

## 2020-06-27 MED FILL — CloNIDine HCL 0.1 MG TAB: 0.1 | 30 days supply | Qty: 30 | Fill #8

## 2020-06-27 NOTE — Telephone Encounter (Signed)
Please have patient schedule f/u visit to discuss lyrica in the next 3 months. Refill provided

## 2020-06-27 NOTE — Telephone Encounter (Signed)
Last office visit 05/03/2020 for establish care.  Last refilled 10/23/2019 for #60 with 5 refills by Dr. Deborra Medina.  No future appointments with PCP.

## 2020-06-28 MED FILL — SERTRALINE HCL 50 MG TABLET: 50 | 30 days supply | Qty: 30 | Fill #0

## 2020-06-28 MED FILL — CYCLOBENZAPRINE HCL 10 MG T: 10 | 10 days supply | Qty: 30 | Fill #0

## 2020-06-28 MED FILL — PANTOPRAZOLE SOD DR 40 MG T: 40 | 30 days supply | Qty: 30 | Fill #0

## 2020-06-28 NOTE — Telephone Encounter (Signed)
Called patient and scheduled follow up 10/20.

## 2020-07-05 ENCOUNTER — Telehealth: Payer: Self-pay | Admitting: Emergency Medicine

## 2020-07-05 NOTE — Telephone Encounter (Signed)
Received request from [facility] for [place] PA reauthorization. Resubmitted, Key [id number].

## 2020-07-07 NOTE — Telephone Encounter (Signed)
Received notification of original CMM PA request needed to be resubmitted due to inactive eligibility of insurance, called [employer] and verified insurance and filed PA over the phone with [employer].  Received Approval over the phone.  Approval ID [id number], approval dates: 06/07/2020 - 07/07/2021.  Patient sent message of approval through [name] [name].

## 2020-07-11 NOTE — Progress Notes (Signed)
PATIENT: [name] [name] DOB: November 08, 1960  REASON FOR VISIT: follow up HISTORY FROM: patient  HISTORY OF PRESENT ILLNESS: Today 07/12/20  [name] [name] is a 59 year old female with history of glioblastoma recent MRI of the brain has shown some recurrence of the tumor, and activity.  She has been on oral and IV chemo, she skipped a month due to side effects, is now back to taking just 1 treatment (either IV or oral chemo) a month, considering conservations about quality of life going forward.  Has been placed on Ritalin for fatigue from chemo.    Over the last 6 months, her husband has noted some decline in her function, and balance, has a tendency to fall when standing.  She has a dense left homonymous visual field deficit.  For headache, continues with daily headache when she wakes, 1-2 migraines a week, will take Maxalt, if not beneficial, will take Vicodin, as last resort Percocet. Headaches chronically since glioblastoma.  Remains on Aimovig and Depakote.  Some concern for seizure potential-good she is on Depakote.  Has been dealing with glioblastoma for 15 years.  Does note headaches are worse with weather change.  Has unusual sleep patterns, does her best work from 6 PM to 10 PM, when her energy kicks in, stays up throughout the night watching TV. Her morning headache, is right occipital, with neck pain, takes Flexeril only as needed during the day.  Presents today for evaluation accompanied by her husband.  HISTORY 01/05/2020 Dr. Jannifer [name]: [name] [name] is a 59 year old right-handed white female with a history of glioblastoma.  The patient has had surgery done in January 2020 and had a gamma knife procedure in June 2020.  The patient had a recent MRI of the brain several weeks ago that showed some recurrence of the tumor.  Following the surgery and January 2020 the patient developed a dense left homonymous visual field deficit.  The patient is not had any falls but her balance is slightly  off.  She is continue to have frequent headaches, she is having headaches every day but her severe migraine headaches occur when there are weather changes.  The patient is on chemotherapy for her tumor including Avastin and Temodar.  In the past, the patient has been on Topamax, amitriptyline and she currently is on Depakote without benefit with the headaches.  She is also on Lyrica and metoprolol.  She takes Maxalt if needed for the headache.   REVIEW OF SYSTEMS: Out of a complete 14 system review of symptoms, the patient complains only of the following symptoms, and all other reviewed systems are negative.  Headache  ALLERGIES: Allergies  Allergen Reactions  . Shrimp [Shellfish Allergy] Nausea And Vomiting  . Morphine Other (See Comments)    Bladder Spasms (intolerance) Tolerates dilaudid, fentanyl, oxycodone, hydrocodone  . Latex Rash and Other (See Comments)    Blistering of skin  . Pseudoephedrine Palpitations and Other (See Comments)    Tolerates XR formulation  . Red Dye Hives  . Silver Rash and Other (See Comments)    Tegaderm - if left on for > 24 hr    HOME MEDICATIONS: Outpatient Medications Prior to Visit  Medication Sig Dispense Refill  . ALPRAZolam (XANAX) 0.25 MG tablet Take by mouth as needed.     . Calcium Citrate-Vitamin D (CALCIUM CITRATE + PO) Take 1 tablet by mouth 3 (three) times daily.    . cloNIDine (CATAPRES) 0.1 MG tablet Take 0.1 mg by mouth daily.     Marland Kitchen  cyclobenzaprine (FLEXERIL) 10 MG tablet Take 10 mg by mouth 3 (three) times daily as needed for muscle spasms.    Marland Kitchen dexamethasone (DECADRON) 1 MG tablet Follow taper instructions. Take 2mg  for 3 days then 1mg  for 3 days following chemo.    . divalproex (DEPAKOTE) 500 MG DR tablet TAKE 1 TABLET BY MOUTH TWICE DAILY (NEED APPT FOR FURTHER REFILLS) 180 tablet 1  . Docusate Sodium (DSS) 250 MG CAPS Take by mouth as needed.    Marland Kitchen granisetron (KYTRIL) 1 MG tablet SMARTSIG:1 Tablet(s) By Mouth Every 12 Hours PRN      . HYDROcodone-acetaminophen (NORCO/VICODIN) 5-325 MG tablet Take 1 tablet by mouth every 6 (six) hours as needed for moderate pain. 30 tablet 0  . HYDROcodone-acetaminophen (NORCO/VICODIN) 5-325 MG tablet Take by mouth.    [name] MAINTENANCE PACK 10 MCG INST     . magnesium oxide (MAG-OX) 400 MG tablet Take 400 mg by mouth 2 (two) times daily.     . meloxicam (MOBIC) 7.5 MG tablet Take 7.5 mg by mouth daily.    . methylphenidate (RITALIN) 5 MG tablet Take 5 mg by mouth 2 (two) times daily.    . metoprolol tartrate (LOPRESSOR) 25 MG tablet TAKE 1 TABLET BY MOUTH TWICE DAILY (Patient taking differently: Take 25 mg by mouth daily. ) 180 tablet 0  . metroNIDAZOLE (METROGEL) 1 % gel Apply 1 application topically daily. For rosacea with papular breakouts.  11  . ondansetron (ZOFRAN) 8 MG tablet Take 8 mg by mouth 3 (three) times daily.    Marland Kitchen oxyCODONE (OXY IR/ROXICODONE) 5 MG immediate release tablet Take by mouth.    . oxyCODONE-acetaminophen (PERCOCET/ROXICET) 5-325 MG tablet Take 1 tablet by mouth every 6 (six) hours as needed (migraine refractory to triptans and Vicodin). 30 tablet 0  . Oxymetazoline HCl (RHOFADE) 1 % CREA Apply 1 application topically as needed.    . pantoprazole (PROTONIX) 40 MG tablet TAKE 1 TABLET BY MOUTH ONCE DAILY 90 tablet 0  . Pediatric Multiple Vitamins (CHEWABLE MULTIPLE VITAMINS PO) Take 1 tablet by mouth 2 (two) times daily.     . pregabalin (LYRICA) 25 MG capsule Take 1 capsule (25 mg total) by mouth 2 (two) times daily. 60 capsule 3  . sertraline (ZOLOFT) 50 MG tablet Take 50 mg by mouth daily.     Marland Kitchen temozolomide (TEMODAR) 100 MG capsule TAKE 3 (100 MG) CAPSULES AND 1 (20 MG) CAPSULE BY MOUTH DAILY FOR 5 DAYS    . valACYclovir (VALTREX) 1000 MG tablet As needed for cold sores  3  . vitamin B-12 (CYANOCOBALAMIN) 250 MCG tablet Take 250 mcg by mouth daily.    Eduard Roux (AIMOVIG) 140 MG/ML SOAJ Inject 140 mg into the skin every 30 (thirty) days. 1.12 mg 4  .  rizatriptan (MAXALT) 10 MG tablet TAKE 1 TABLET BY MOUTH TWICE DAILY AS NEEDED FOR MIGRAINE, MAY REPEAT IN 2 HOURS IF NEEDED 10 tablet 5   No facility-administered medications prior to visit.    PAST MEDICAL HISTORY: Past Medical History:  Diagnosis Date  . Anemia   . Bladder spasms   . Brain cancer ([place]) 10/09/05   glioblastoma right temp/occipital  . Complication of anesthesia   . DDD (degenerative disc disease), lumbar   . Depression    Migraines  . GERD (gastroesophageal reflux disease)   . Glioblastoma multiforme ([place]) 2007   Followed by Dr. Vedia Coffer at [facility]  . Hypertension    benign  . Microcytic anemia  iron def  2ndary chronic blood loss  . Migraine, unspecified, without mention of intractable migraine without mention of status migrainosus   . Obesity   . Overweight(278.02)   . PVC (premature ventricular contraction)   . Rash    occasional recurrent  . Tachycardia, unspecified   . Viral meningitis     PAST SURGICAL HISTORY: Past Surgical History:  Procedure Laterality Date  . ABDOMINAL HYSTERECTOMY    . BACK SURGERY     lumbar  . BILATERAL SALPINGOOPHORECTOMY    . BLADDER SURGERY     tack  . BRAIN SURGERY  2014   resection of glioblastoma multiform  . CESAREAN SECTION     x 2   . CRANIOTOMY Right 11/07/2018  . CYSTOCELE REPAIR    . gamma knife     tx of brain tumor  . LAPAROSCOPIC GASTRIC SLEEVE RESECTION WITH HIATAL HERNIA REPAIR N/A 09/06/2015   Procedure: LAPAROSCOPIC GASTRIC SLEEVE RESECTION WITH HIATAL HERNIA REPAIR;  Surgeon: Johnathan Hausen, MD;  Location: [facility];  Service: General;  Laterality: N/A;  . PORTACATH PLACEMENT    . UPPER GI ENDOSCOPY  09/06/2015   Procedure: UPPER GI ENDOSCOPY;  Surgeon: Johnathan Hausen, MD;  Location: [facility];  Service: General;;    FAMILY HISTORY: Family History  Problem Relation Age of Onset  . Cancer Other        tongue  . Heart disease Mother        bypass age 44  . Heart disease Father        heart  failure, atrial fib  . Diabetes Father   . Liver disease Brother        cholangistis   . Diabetes Brother   . Diabetes Sister   . ALS Brother   . Colon cancer Neg Hx   . Breast cancer Neg Hx     SOCIAL HISTORY: Social History   Socioeconomic History  . Marital status: Married    Spouse name: Synetta Shadow   . Number of children: 2  . Years of education: college  . Highest education level: Not on file  Occupational History    Employer: [employer]    Comment: RN  Tobacco Use  . Smoking status: Never Smoker  . Smokeless tobacco: Never Used  Substance and Sexual Activity  . Alcohol use: Not Currently    Alcohol/week: 0.0 standard drinks  . Drug use: No  . Sexual activity: Not Currently    Birth control/protection: Surgical  Other Topics Concern  . Not on file  Social History Narrative   Right handed.   Caffeine one mountain dew diet.    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 Difficulty of Paying Living Expenses: Not on file  Food Insecurity:   . Worried About Charity fundraiser in the Last Year:  Not on file  . Ran Out of Food in the Last Year: Not on file  Transportation Needs:   . Lack of Transportation (Medical): Not on file  . Lack of Transportation (Non-Medical): Not on file  Physical Activity:   . Days of Exercise per Week: Not on file  . Minutes of Exercise per Session: Not on file  Stress:   . Feeling of Stress : Not on file  Social Connections:   . Frequency of Communication with  Friends and Family: Not on file  . Frequency of Social Gatherings with Friends and Family: Not on file  . Attends Religious Services: Not on file  . Active Member of Clubs or Organizations: Not on file  . Attends Archivist Meetings: Not on file  . Marital Status: Not on file  Intimate Partner Violence:   . Fear of Current or Ex-Partner: Not on file  . Emotionally Abused: Not on file  . Physically Abused: Not on file  . Sexually Abused: Not on file   PHYSICAL EXAM  Vitals:   07/12/20 0928  BP: 126/76  Weight: 214 lb 6.4 oz (97.3 kg)  Height: 5' 3.5" (1.613 m)   Body mass index is 37.38 kg/m.  Generalized: Well developed, in no acute distress   Neurological examination  Mentation: Alert oriented to time, place, history taking. Follows all commands speech and language fluent Cranial nerve II-XII: Pupils were equal round reactive to light.  Left homonymous visual field deficit. Facial sensation and strength were normal. Head turning and shoulder shrug were normal and symmetric. Motor: Good strength of all extremities Sensory: Sensory testing is intact to soft touch on all 4 extremities. No evidence of extinction is noted.  Coordination: Cerebellar testing reveals good finger-nose-finger and heel-to-shin bilaterally.  Gait and station: Gait is normal, but slightly wide-based. Reflexes: Deep tendon reflexes are symmetric and normal bilaterally.   DIAGNOSTIC DATA (LABS, IMAGING, TESTING) - I reviewed patient records, labs, notes, testing and imaging myself where available.  Lab Results  Component Value Date   WBC 5.5 05/17/2020   HGB 13.9 05/17/2020   HCT 43.9 05/17/2020   MCV 95.0 05/17/2020   PLT 160 05/17/2020      Component Value Date/Time   NA 143 10/29/2019 0000   NA 143 01/05/2014 0823   K 4.0 10/29/2019 0000   K 4.5 01/05/2014 0823   CL 109 (A) 10/29/2019 0000   CL 105 01/12/2013 0854   CO2 28 (A) 10/29/2019 0000   CO2 24 01/05/2014 0823   GLUCOSE 86  06/24/2019 0947   GLUCOSE 144 (H) 01/05/2014 0823   GLUCOSE 91 01/12/2013 0854   BUN 11 10/29/2019 0000   BUN 9.6 01/05/2014 0823   CREATININE 0.8 10/29/2019 0000   CREATININE 0.90 06/24/2019 0947   CREATININE 0.9 01/05/2014 0823   CALCIUM 9.2 10/29/2019 0000   CALCIUM 10.0 01/05/2014 0823   PROT 6.1 06/24/2019 0947   PROT 6.9 01/05/2014 0823   ALBUMIN 3.8 10/29/2019 0000   ALBUMIN 3.1 (L) 01/05/2014 0823   AST 35 10/29/2019 0000   AST 32 01/05/2014 0823   ALT 30 10/29/2019 0000   ALT 25 01/05/2014 0823   ALKPHOS 54 10/29/2019 0000   ALKPHOS 108 01/05/2014 0823   BILITOT 0.6 06/24/2019 0947   BILITOT 0.35 01/05/2014 0823   GFRNONAA 77 10/29/2019 0000   GFRAA >60 09/06/2015 1519   Lab Results  Component Value Date   CHOL 201 (H) 06/24/2019   HDL 52.00 06/24/2019  LDLCALC 114 (H) 06/24/2019   LDLDIRECT 114.0 03/17/2018   TRIG 172.0 (H) 06/24/2019   CHOLHDL 4 06/24/2019   No results found for: HGBA1C Lab Results  Component Value Date   VITAMINB12 >1500 (H) 06/24/2019   Lab Results  Component Value Date   TSH 1.25 06/24/2019   ASSESSMENT AND PLAN 59 y.o. year old female  has a past medical history of Anemia, Bladder spasms, Brain cancer ([place]) (02/10/72), Complication of anesthesia, DDD (degenerative disc disease), lumbar, Depression, GERD (gastroesophageal reflux disease), Glioblastoma multiforme ([place]) (2007), Hypertension, Microcytic anemia, Migraine, unspecified, without mention of intractable migraine without mention of status migrainosus, Obesity, Overweight(278.02), PVC (premature ventricular contraction), Rash, Tachycardia, unspecified, and Viral meningitis. here with:  1.  Glioblastoma 2.  Intractable migraine headache  -Recent MRI of the brain has shown some tumor growth and activity -Continues with daily morning headache, 1-2 migraines a week -Add on baclofen 10 mg at bedtime (wakes with neck pain/ right occipital pain, may trigger headache) -Can continue  Aimovig 140 mg monthly injection, may discontinue this going forward if no benefit, continue Maxalt as needed for acute headache -Continue Depakote 500 mg twice a day, mentions concerns for seizure potential with glioblastoma activity, good she is on this, at next appointment will have them check CMP for liver enzymes -Follow-up in 6 months or sooner if needed  I spent 30 minutes of face-to-face and non-face-to-face time with patient.  This included previsit chart review, lab review, study review, order entry, electronic health record documentation, patient education.  Butler Denmark, AGNP-C, DNP 07/12/2020, 10:28 AM [facility] [address], [place], [place] [address] [phone]

## 2020-07-12 ENCOUNTER — Other Ambulatory Visit: Payer: Self-pay | Admitting: Neurology

## 2020-07-12 ENCOUNTER — Ambulatory Visit (INDEPENDENT_AMBULATORY_CARE_PROVIDER_SITE_OTHER): Payer: Commercial Managed Care - PPO | Admitting: Neurology

## 2020-07-12 ENCOUNTER — Other Ambulatory Visit: Payer: Self-pay

## 2020-07-12 ENCOUNTER — Encounter: Payer: Self-pay | Admitting: Neurology

## 2020-07-12 VITALS — BP 126/76 | Ht 63.5 in | Wt 214.4 lb

## 2020-07-12 DIAGNOSIS — G43709 Chronic migraine without aura, not intractable, without status migrainosus: Secondary | ICD-10-CM | POA: Diagnosis not present

## 2020-07-12 DIAGNOSIS — C712 Malignant neoplasm of temporal lobe: Secondary | ICD-10-CM

## 2020-07-12 MED ORDER — RIZATRIPTAN BENZOATE 10 MG PO TABS: ORAL_TABLET | ORAL | 5 refills | Status: DC

## 2020-07-12 MED ORDER — AIMOVIG 140 MG/ML ~~LOC~~ SOAJ: 140.0000 mg | SUBCUTANEOUS | 11 refills | Status: DC

## 2020-07-12 MED ORDER — BACLOFEN 10 MG PO TABS
10.0000 mg | ORAL_TABLET | Freq: Every day | ORAL | 3 refills | Status: DC
Start: 2020-07-12 — End: 2020-07-12

## 2020-07-12 MED FILL — RIZATRIPTAN BENZOATE 10 MG: 10 | 30 days supply | Qty: 10 | Fill #0

## 2020-07-12 MED FILL — AIMOVIG 140 MG/ML SOAJ: 140 | 30 days supply | Qty: 1 | Fill #0

## 2020-07-12 MED FILL — BACLOFEN 10 MG TABS: 10 | 30 days supply | Qty: 30 | Fill #0

## 2020-07-12 NOTE — Patient Instructions (Signed)
Add on Baclofen 10 mg at bedtime Continue Depakote, Aimovig At your next appointment, have them check liver enzymes  See you back in 6 months

## 2020-07-12 NOTE — Progress Notes (Signed)
I have read the note, and I agree with the clinical assessment and plan.  Grete Bosko K Lilian Fuhs   

## 2020-07-14 ENCOUNTER — Other Ambulatory Visit (HOSPITAL_COMMUNITY): Payer: Self-pay | Admitting: Medical Oncology

## 2020-07-14 MED FILL — METOPROLOL TARTRATE 25 MG T: 25 | 30 days supply | Qty: 60 | Fill #2

## 2020-07-14 MED FILL — METHYLPHENIDATE HCL 5 MG TA: 5 | 30 days supply | Qty: 60 | Fill #0

## 2020-07-27 ENCOUNTER — Ambulatory Visit: Payer: Commercial Managed Care - PPO | Admitting: Family Medicine

## 2020-07-28 ENCOUNTER — Other Ambulatory Visit: Payer: Self-pay

## 2020-07-28 ENCOUNTER — Ambulatory Visit: Payer: Commercial Managed Care - PPO | Admitting: Family Medicine

## 2020-07-28 ENCOUNTER — Encounter: Payer: Self-pay | Admitting: Family Medicine

## 2020-07-28 VITALS — BP 110/68 | HR 71 | Temp 98.2°F | Ht 63.5 in | Wt 212.8 lb

## 2020-07-28 DIAGNOSIS — Z13220 Encounter for screening for lipoid disorders: Secondary | ICD-10-CM | POA: Diagnosis not present

## 2020-07-28 DIAGNOSIS — Z0000 Encounter for general adult medical examination without abnormal findings: Secondary | ICD-10-CM | POA: Diagnosis not present

## 2020-07-28 DIAGNOSIS — I10 Essential (primary) hypertension: Secondary | ICD-10-CM

## 2020-07-28 LAB — COMPREHENSIVE METABOLIC PANEL WITH GFR
ALT: 15 U/L (ref 0–35)
AST: 15 U/L (ref 0–37)
Albumin: 4.1 g/dL (ref 3.5–5.2)
Alkaline Phosphatase: 68 U/L (ref 39–117)
BUN: 13 mg/dL (ref 6–23)
CO2: 31 meq/L (ref 19–32)
Calcium: 9.3 mg/dL (ref 8.4–10.5)
Chloride: 108 meq/L (ref 96–112)
Creatinine, Ser: 0.9 mg/dL (ref 0.40–1.20)
GFR: 69.99 mL/min
Glucose, Bld: 79 mg/dL (ref 70–99)
Potassium: 4.2 meq/L (ref 3.5–5.1)
Sodium: 145 meq/L (ref 135–145)
Total Bilirubin: 0.7 mg/dL (ref 0.2–1.2)
Total Protein: 6.6 g/dL (ref 6.0–8.3)

## 2020-07-28 LAB — LIPID PANEL
Cholesterol: 235 mg/dL — ABNORMAL HIGH (ref 0–200)
HDL: 46.3 mg/dL
NonHDL: 189.13
Total CHOL/HDL Ratio: 5
Triglycerides: 205 mg/dL — ABNORMAL HIGH (ref 0.0–149.0)
VLDL: 41 mg/dL — ABNORMAL HIGH (ref 0.0–40.0)

## 2020-07-28 LAB — LDL CHOLESTEROL, DIRECT: Direct LDL: 154 mg/dL

## 2020-07-28 MED FILL — METHYLPHENIDATE HCL 5 MG TA: 5 | 30 days supply | Qty: 60 | Fill #0

## 2020-07-28 NOTE — Progress Notes (Signed)
Annual Exam   Chief Complaint:  Chief Complaint  Patient presents with  . Annual Exam    Fasting    History of Present Illness:  [name] is a 59 y.o. No obstetric history on file. who LMP was No LMP recorded. Patient has had a hysterectomy., presents today for her annual examination.     Nutrition She does get adequate calcium and Vitamin D in her diet. Diet: what she wants, regular meals, rare nausea w/ chemo Exercise: walks 1 mile to get the mail daily, - ambulates with walker  Safety The patient wears seatbelts: yes.     The patient feels safe at home and in their relationships: yes.   Menstrual:  Symptoms of menopause: none, treated with GYN with Imvexxy S/p hysterectomy  GYN She is single partner, contraception - status post hysterectomy.    Cervical Cancer Screening (21-65):   Last Pap:   September 2016 Results were: no abnormalities /neg HPV DNA   No longer has a cervix  Breast Cancer Screening (Age 25-74):  There is FH of breast cancer. There is no FH of ovarian cancer. BRCA screening Not Indicated.  Last Mammogram: 08/12/2019 The patient does want a mammogram this year.    Colon Cancer Screening:  Age 23-59 yo - benefits outweigh the risk. Adults 80-85 yo who have never been screened benefit.  Benefits: 134000 people in 2016 will be diagnosed and 49,000 will die - early detection helps [place]: Complications 2/2 to colonoscopy High Risk (Colonoscopy): genetic disorder (Lynch syndrome or familial adenomatous polyposis), personal hx of IBD, previous adenomatous polyp, or previous colorectal cancer, FamHx start 10 years before the age at diagnosis, increased in males and black race  Options:  FIT - looks for hemoglobin (blood in the stool) - specific and fairly sensitive - must be done annually Cologuard - looks for DNA and blood - more sensitive - therefore can have more false positives, every 3 years Colonoscopy - every 10 years if normal -  sedation, bowl prep, must have someone drive you  Shared decision making and the patient had decided to do colonoscopy in 02/2022.   Social History   Tobacco Use  Smoking Status Never Smoker  Smokeless Tobacco Never Used    Lung Cancer Screening (Ages 82-99): not applicable   Weight Wt Readings from Last 3 Encounters:  07/28/20 212 lb 12 oz (96.5 kg)  07/12/20 214 lb 6.4 oz (97.3 kg)  05/03/20 (!) 211 lb 8 oz (95.9 kg)   Patient has high BMI  BMI Readings from Last 1 Encounters:  07/28/20 37.10 kg/m   Down from 246 lb in the last year   Chronic disease screening Blood pressure monitoring:  BP Readings from Last 3 Encounters:  07/28/20 110/68  07/12/20 126/76  05/03/20 115/73    Lipid Monitoring: Indication for screening: age >69, obesity, diabetes, family hx, CV risk factors.  Lipid screening: Yes  Lab Results  Component Value Date   CHOL 201 (H) 06/24/2019   HDL 52.00 06/24/2019   LDLCALC 114 (H) 06/24/2019   LDLDIRECT 114.0 03/17/2018   TRIG 172.0 (H) 06/24/2019   CHOLHDL 4 06/24/2019     Diabetes Screening: age >56, overweight, family hx, PCOS, hx of gestational diabetes, at risk ethnicity Diabetes Screening screening: Yes  No results found for: HGBA1C   Past Medical History:  Diagnosis Date  . Anemia   . Bladder spasms   . Brain cancer ([place]) 10/09/05   glioblastoma right temp/occipital  .  Complication of anesthesia   . DDD (degenerative disc disease), lumbar   . Depression    Migraines  . GERD (gastroesophageal reflux disease)   . Glioblastoma multiforme ([place]) 2007   Followed by Dr. Vedia Coffer at [facility] [place]  . Hypertension    benign  . Microcytic anemia    iron def  2ndary chronic blood loss  . Migraine, unspecified, without mention of intractable migraine without mention of status migrainosus   . Obesity   . Overweight(278.02)   . PVC (premature ventricular contraction)   . Rash    occasional recurrent  . Tachycardia, unspecified   .  Viral meningitis     Past Surgical History:  Procedure Laterality Date  . ABDOMINAL HYSTERECTOMY    . BACK SURGERY     lumbar  . BILATERAL SALPINGOOPHORECTOMY    . BLADDER SURGERY     tack  . BRAIN SURGERY  2014   resection of glioblastoma multiform  . CESAREAN SECTION     x 2   . CRANIOTOMY Right 11/07/2018  . CYSTOCELE REPAIR    . gamma knife     tx of brain tumor  . LAPAROSCOPIC GASTRIC SLEEVE RESECTION WITH HIATAL HERNIA REPAIR N/A 09/06/2015   Procedure: LAPAROSCOPIC GASTRIC SLEEVE RESECTION WITH HIATAL HERNIA REPAIR;  Surgeon: Johnathan Hausen, MD;  Location: [facility];  Service: General;  Laterality: N/A;  . PORTACATH PLACEMENT    . UPPER GI ENDOSCOPY  09/06/2015   Procedure: UPPER GI ENDOSCOPY;  Surgeon: Johnathan Hausen, MD;  Location: [facility];  Service: General;;    Prior to Admission medications   Medication Sig Start Date End Date Taking? Authorizing Provider  ALPRAZolam Duanne Moron) 0.25 MG tablet Take by mouth as needed.  11/19/19  Yes [provider]  baclofen (LIORESAL) 10 MG tablet Take 1 tablet (10 mg total) by mouth at bedtime. 07/12/20  Yes Suzzanne Cloud, NP  Calcium Citrate-Vitamin D (CALCIUM CITRATE + PO) Take 1 tablet by mouth 3 (three) times daily.   Yes [provider]  cloNIDine (CATAPRES) 0.1 MG tablet Take 0.1 mg by mouth daily.    Yes [provider]  cyclobenzaprine (FLEXERIL) 10 MG tablet Take 10 mg by mouth 3 (three) times daily as needed for muscle spasms.   Yes [provider]  dexamethasone (DECADRON) 1 MG tablet Follow taper instructions. Take 69m for 3 days then 190mfor 3 days following chemo. 03/03/20  Yes [provider]  divalproex (DEPAKOTE) 500 MG DR tablet TAKE 1 TABLET BY MOUTH TWICE DAILY (NEED APPT FOR FURTHER REFILLS) 05/17/20  Yes WiKathrynn DuckingMD  Docusate Sodium (DSS) 250 MG CAPS Take by mouth as needed.   Yes [provider]  Erenumab-aooe (AIMOVIG) 140 MG/ML SOAJ Inject 140 mg into the  skin every 30 (thirty) days. 07/12/20  Yes SlSuzzanne CloudNP  granisetron (KYTRIL) 1 MG tablet SMARTSIG:1 Tablet(s) By Mouth Every 12 Hours PRN 01/27/20  Yes [provider]  HYDROcodone-acetaminophen (NORCO/VICODIN) 5-325 MG tablet Take 1 tablet by mouth every 6 (six) hours as needed for moderate pain. 02/25/19  Yes WiKathrynn DuckingMD  IMVEXXY MAINTENANCE PACK 10 MCG INST Place 1 suppository vaginally 2 (two) times a week.  03/31/20  Yes [provider]  magnesium oxide (MAG-OX) 400 MG tablet Take 400 mg by mouth 2 (two) times daily.    Yes [provider]  meloxicam (MOBIC) 7.5 MG tablet Take 7.5 mg by mouth daily.   Yes [provider]  methylphenidate (RITALIN) 5 MG tablet Take 5 mg by mouth 2 (two) times daily. 03/03/20  Yes [provider]  metoprolol tartrate (LOPRESSOR) 25 MG tablet TAKE 1 TABLET BY MOUTH TWICE DAILY Patient taking differently: Take 25 mg by mouth daily.  05/28/14  Yes Lucille Passy, MD  metroNIDAZOLE (METROGEL) 1 % gel Apply 1 application topically as needed. For rosacea with papular breakouts. 04/13/16  Yes [provider]  ondansetron (ZOFRAN) 8 MG tablet Take 8 mg by mouth 3 (three) times daily. 01/05/20  Yes [provider]  oxyCODONE-acetaminophen (PERCOCET/ROXICET) 5-325 MG tablet Take 1 tablet by mouth every 6 (six) hours as needed (migraine refractory to triptans and Vicodin). 02/25/19  Yes Kathrynn Ducking, MD  Oxymetazoline HCl (RHOFADE) 1 % CREA Apply 1 application topically as needed.   Yes [provider]  pantoprazole (PROTONIX) 40 MG tablet TAKE 1 TABLET BY MOUTH ONCE DAILY Patient taking differently: Take 40 mg by mouth 2 (two) times daily.  10/18/15  Yes Lucille Passy, MD  Pediatric Multiple Vitamins (CHEWABLE MULTIPLE VITAMINS PO) Take 1 tablet by mouth 2 (two) times daily.    Yes [provider]  pregabalin (LYRICA) 25 MG capsule Take 1 capsule (25 mg total) by mouth 2 (two) times  daily. 06/27/20  Yes Lesleigh Noe, MD  rizatriptan (MAXALT) 10 MG tablet TAKE 1 TABLET BY MOUTH TWICE DAILY AS NEEDED FOR MIGRAINE, MAY REPEAT IN 2 HOURS IF NEEDED 07/12/20  Yes Suzzanne Cloud, NP  sertraline (ZOLOFT) 50 MG tablet Take 50 mg by mouth daily.  12/10/19  Yes [provider]  temozolomide (TEMODAR) 100 MG capsule Every other month 12/28/19  Yes [provider]  valACYclovir (VALTREX) 1000 MG tablet As needed for cold sores 02/13/18  Yes [provider]  vitamin B-12 (CYANOCOBALAMIN) 250 MCG tablet Take 250 mcg by mouth daily.   Yes [provider]    Allergies  Allergen Reactions  . Shrimp [Shellfish Allergy] Nausea And Vomiting  . Morphine Other (See Comments)    Bladder Spasms (intolerance) Tolerates dilaudid, fentanyl, oxycodone, hydrocodone  . Latex Rash and Other (See Comments)    Blistering of skin  . Pseudoephedrine Palpitations and Other (See Comments)    Tolerates XR formulation  . Red Dye Hives  . Silver Rash and Other (See Comments)    Tegaderm - if left on for > 24 hr    Gynecologic History: No LMP recorded. Patient has had a hysterectomy.  Obstetric History: No obstetric history on file.  Social History   Socioeconomic History  . Marital status: Married    Spouse name: Synetta Shadow   . Number of children: 2  . Years of education: college  . Highest education level: Not on file  Occupational History    Employer: [employer]    Comment: RN  Tobacco Use  . Smoking status: Never Smoker  . Smokeless tobacco: Never Used  Substance and Sexual Activity  . Alcohol use: Not Currently    Alcohol/week: 0.0 standard drinks  . Drug use: No  . Sexual activity: Not Currently    Birth control/protection: Surgical  Other Topics Concern  . Not on file  Social History Narrative   Right handed.   Caffeine one mountain dew diet.    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 Difficulty of Paying Living Expenses: Not on file  Food Insecurity:   . Worried About Charity fundraiser in the Last Year: Not on file  . Ran Out of Food in the Last Year: Not on file  Transportation Needs:   . Lack of Transportation (Medical): Not on file  . Lack of Transportation (Non-Medical): Not on file  Physical Activity:   . Days of Exercise per Week: Not on file  . Minutes of Exercise per Session: Not on file  Stress:   . Feeling of Stress : Not on file  Social Connections:   . Frequency of Communication with Friends and Family: Not on file  . Frequency of Social Gatherings with Friends and Family: Not on file  . Attends Religious Services: Not on file  . Active Member of Clubs or Organizations: Not on file  . Attends Archivist Meetings: Not on file  . Marital Status: Not on file  Intimate Partner Violence:   . Fear of Current or Ex-Partner: Not on file  . Emotionally Abused: Not on file  . Physically Abused: Not on file  . Sexually Abused: Not on file    Family History  Problem Relation Age of Onset  . Cancer Other        tongue  . Heart disease Mother        bypass age 36  . Heart disease Father        heart failure, atrial fib  . Diabetes Father   . Liver disease Brother        cholangistis   . Diabetes Brother   . Diabetes Sister   . ALS Brother   . Colon cancer Neg Hx   . Breast cancer Neg Hx     Review of Systems  Constitutional: Positive for malaise/fatigue (chronic). Negative for chills and fever.  HENT: Negative for  congestion and sore throat.   Eyes: Negative for blurred vision and double vision.  Respiratory: Negative for shortness of breath.   Cardiovascular: Negative for chest pain.  Gastrointestinal: Negative for heartburn, nausea and vomiting.  Genitourinary: Negative.   Musculoskeletal: Negative.  Negative for myalgias.  Skin: Negative for rash.  Neurological: Positive for headaches (follows with neurology). Negative for dizziness.  Endo/Heme/Allergies: Does not bruise/bleed easily.  Psychiatric/Behavioral: Negative for depression. The patient is not nervous/anxious.      Physical Exam BP 110/68 (BP Location: Left Arm, Patient Position: Sitting, Cuff Size: Large)   Pulse 71   Temp 98.2 F (36.8 C) (Temporal)   Ht 5' 3.5" (1.613 m)   Wt 212 lb 12 oz (96.5 kg)   SpO2 98%   BMI 37.10 kg/m    BP Readings from Last 3 Encounters:  07/28/20 110/68  07/12/20 126/76  05/03/20 115/73      Physical Exam Constitutional:      General: She is not in acute distress.    Appearance: She is well-developed. She is not diaphoretic.  HENT:  Head: Normocephalic and atraumatic.     Right Ear: Tympanic membrane and external ear normal.     Left Ear: Tympanic membrane and external ear normal.     Nose: Nose normal.  Eyes:     General: No scleral icterus.    Conjunctiva/sclera: Conjunctivae normal.  Cardiovascular:     Rate and Rhythm: Normal rate and regular rhythm.     Heart sounds: No murmur heard.   Pulmonary:     Effort: Pulmonary effort is normal. No respiratory distress.     Breath sounds: Normal breath sounds. No wheezing.  Abdominal:     General: Bowel sounds are normal. There is no distension.     Palpations: Abdomen is soft. There is no mass.     Tenderness: There is no abdominal tenderness. There is no guarding or rebound.  Musculoskeletal:        General: Normal range of motion.     Cervical back: Neck supple.  Lymphadenopathy:     Cervical: No cervical adenopathy.   Skin:    General: Skin is warm and dry.     Capillary Refill: Capillary refill takes less than 2 seconds.  Neurological:     Mental Status: She is alert and oriented to person, place, and time. Mental status is at baseline.     Deep Tendon Reflexes: Reflexes normal.  Psychiatric:        Mood and Affect: Mood normal.        Behavior: Behavior normal.     Results:  PHQ-9:    Office Visit from 05/03/2020 in [place] at [facility]  PHQ-9 Total Score 2        Assessment: 59 y.o. No obstetric history on file. female here for routine annual physical examination.  Plan: Problem List Items Addressed This Visit      Cardiovascular and Mediastinum   Hypertension   Relevant Orders   Comprehensive metabolic panel    Other Visit Diagnoses    Annual physical exam    -  Primary   Screening for hyperlipidemia       Relevant Orders   Lipid panel      Screening: -- Blood pressure screen normal -- cholesterol screening: will obtain -- Weight screening: obese: discussed management options, including lifestyle, dietary, and exercise. -- Diabetes Screening: will obtain -- Nutrition: Encouraged healthy diet  The 10-year ASCVD risk score Mikey Bussing DC Jr., et al., 2013) is: 3.1%   Values used to calculate the score:     Age: 44 years     Sex: Female     Is Non-Hispanic African American: No     Diabetic: No     Tobacco smoker: No     Systolic Blood Pressure: 035 mmHg     Is BP treated: Yes     HDL Cholesterol: 52 mg/dL     Total Cholesterol: 201 mg/dL  -- Statin therapy for Age 76-75 with CVD risk >7.5%  Psych -- Depression screening (PHQ-9):    Office Visit from 05/03/2020 in [place] at [facility]  PHQ-9 Total Score 2       Safety -- tobacco screening: not using -- alcohol screening:  low-risk usage. -- no evidence of domestic violence or intimate partner violence.   Cancer Screening -- pap smear not collected per ASCCP guidelines -- family  history of breast cancer screening: done. not at high risk. -- Mammogram - pt is scheduled -- Colon cancer (age 53+)-- due in 2023  Immunizations Immunization History  Administered Date(s) Administered  . Influenza, High Dose Seasonal PF 07/04/2020  . Influenza-Unspecified 07/25/2017  . PFIZER SARS-COV-2 Vaccination 12/22/2019, 01/22/2020  . Tdap 03/17/2018    -- flu vaccine up to date -- TDAP q10 years up to date -- Shingles (age >18) - is going to ask her oncologist -- Covid-19 Vaccine up to date   Encouraged healthy diet and exercise. Encouraged regular vision and dental care.    Lesleigh Noe, MD

## 2020-07-28 NOTE — Patient Instructions (Signed)
Ask oncologist about the Shingles Shot  Get your mammogram

## 2020-08-01 ENCOUNTER — Other Ambulatory Visit (HOSPITAL_COMMUNITY): Payer: Self-pay | Admitting: Medical Oncology

## 2020-08-03 MED FILL — TEMOZOLOMIDE 100 MG CAPS: 100 | 5 days supply | Qty: 15 | Fill #0

## 2020-08-09 ENCOUNTER — Other Ambulatory Visit: Payer: Self-pay | Admitting: Obstetrics and Gynecology

## 2020-08-09 DIAGNOSIS — Z1231 Encounter for screening mammogram for malignant neoplasm of breast: Secondary | ICD-10-CM

## 2020-08-17 ENCOUNTER — Ambulatory Visit: Payer: Commercial Managed Care - PPO

## 2020-08-31 ENCOUNTER — Other Ambulatory Visit (HOSPITAL_COMMUNITY): Payer: Self-pay | Admitting: Medical Oncology

## 2020-08-31 MED FILL — DEXAMETHASONE 4 MG TABLET: 4 | 30 days supply | Qty: 120 | Fill #0

## 2020-09-19 ENCOUNTER — Telehealth: Payer: Self-pay | Admitting: Family Medicine

## 2020-09-19 NOTE — Telephone Encounter (Signed)
Received records that [name] [name] was in hospice. Called to check in. Spoke with husband Synetta [name]. She has declined and she is in the hospice home and doing worse. Anticipate that she will go soon.   Offered support. They doing well with hospice. No needs currently

## 2020-09-23 ENCOUNTER — Ambulatory Visit: Payer: Commercial Managed Care - PPO

## 2020-10-08 DEATH — deceased

## 2021-01-06 ENCOUNTER — Other Ambulatory Visit (HOSPITAL_COMMUNITY): Payer: Self-pay

## 2021-01-16 ENCOUNTER — Ambulatory Visit: Payer: Commercial Managed Care - PPO | Admitting: Neurology

## 2021-09-13 IMAGING — MG DIGITAL SCREENING BILAT W/ TOMO W/ CAD
6 of 10 series · 6 of 30 positions shown · non-contrast
Comparison: Previous exam(s).

CLINICAL DATA: Screening.

EXAM:
DIGITAL SCREENING BILATERAL MAMMOGRAM WITH TOMO AND CAD

[R CC synth-2D]
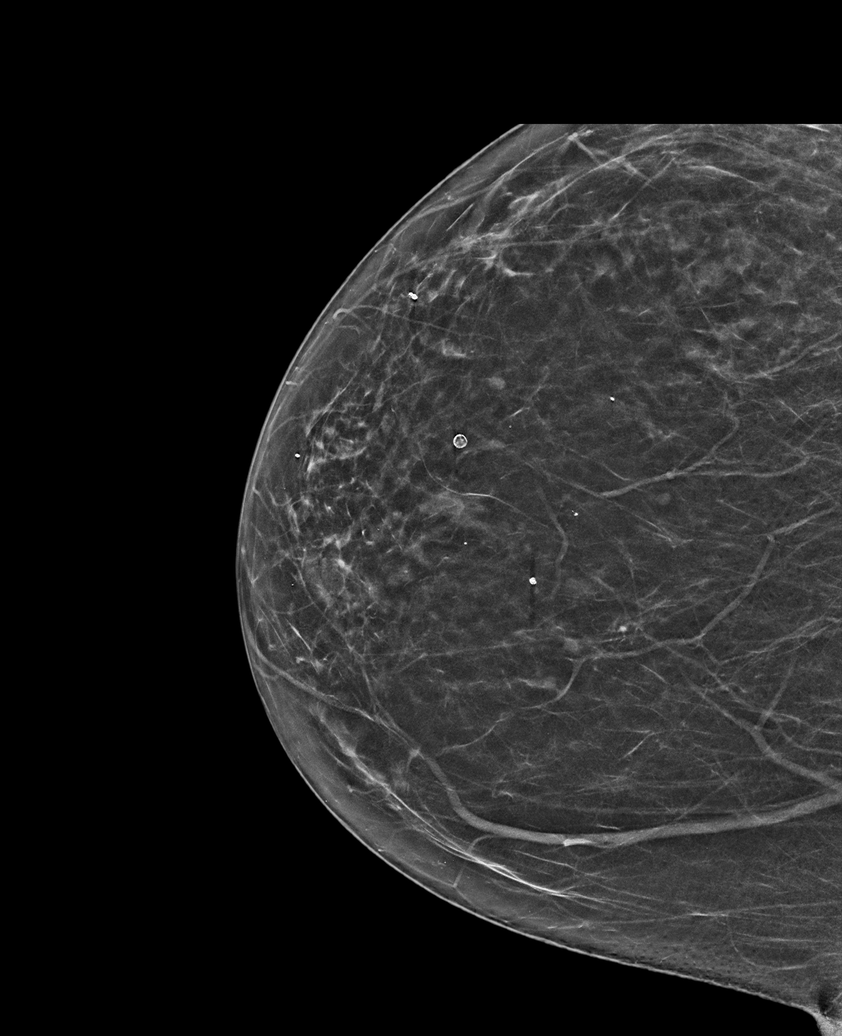

[L MLO synth-2D]
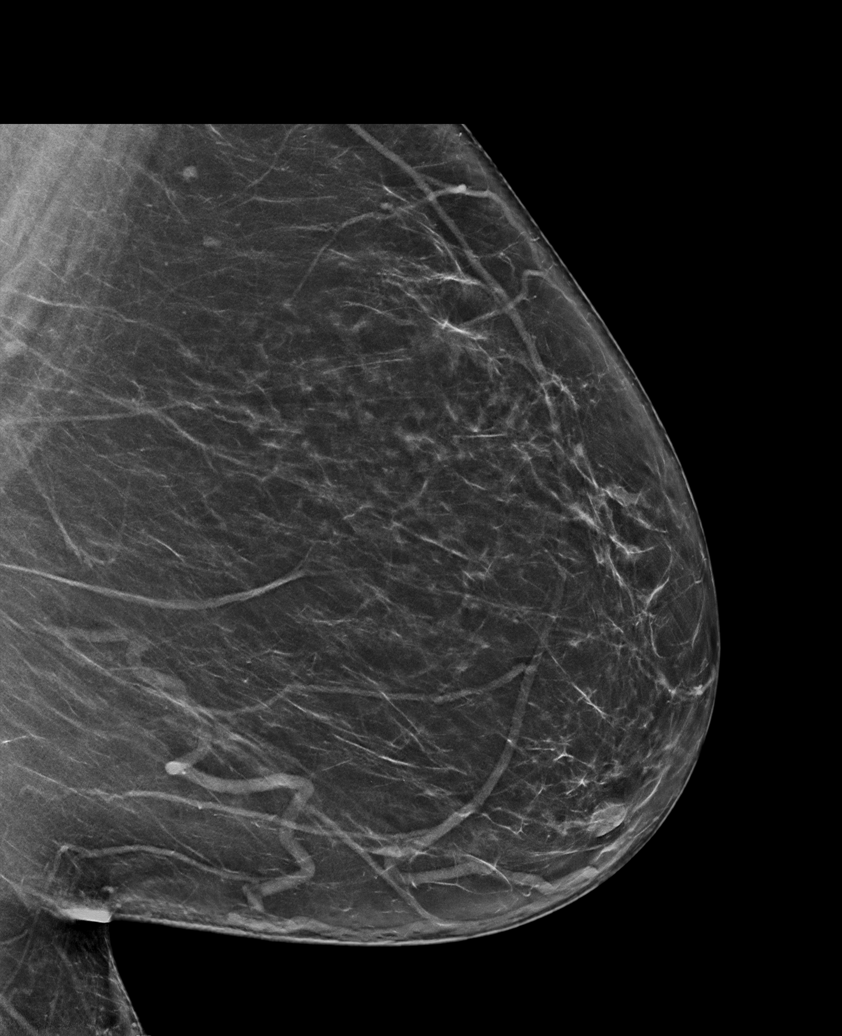

[R MLO synth-2D (1 of 2)]
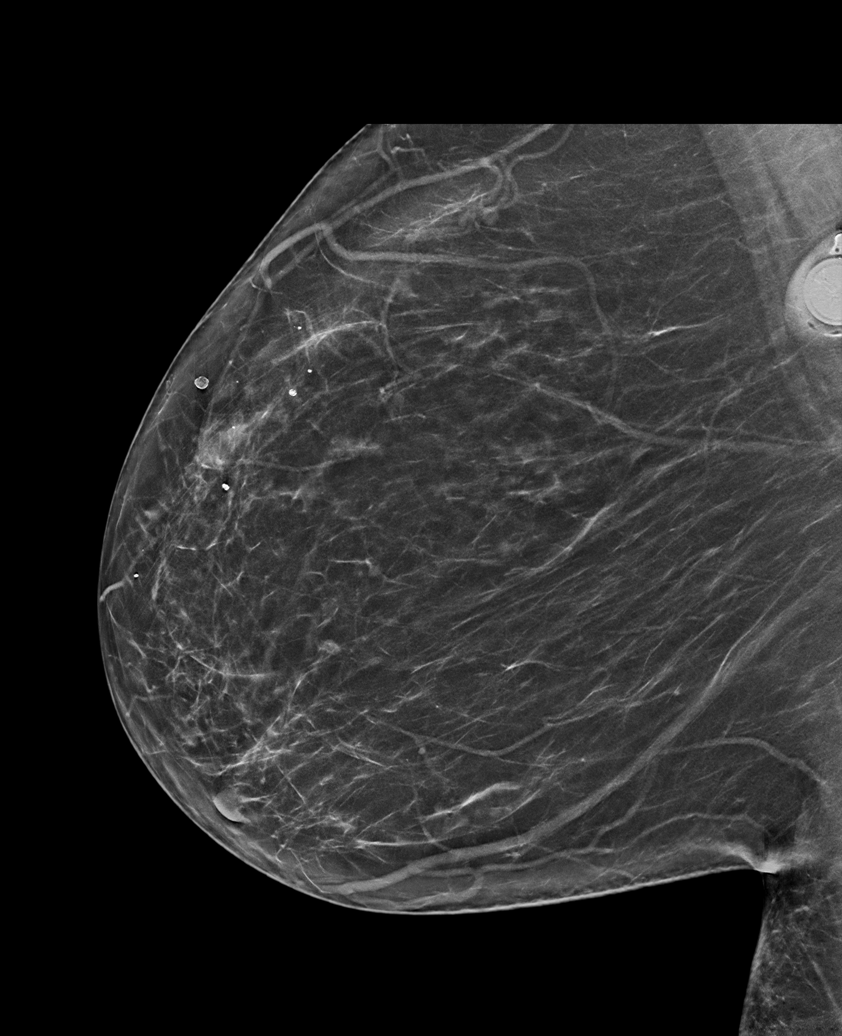

[L CC synth-2D]
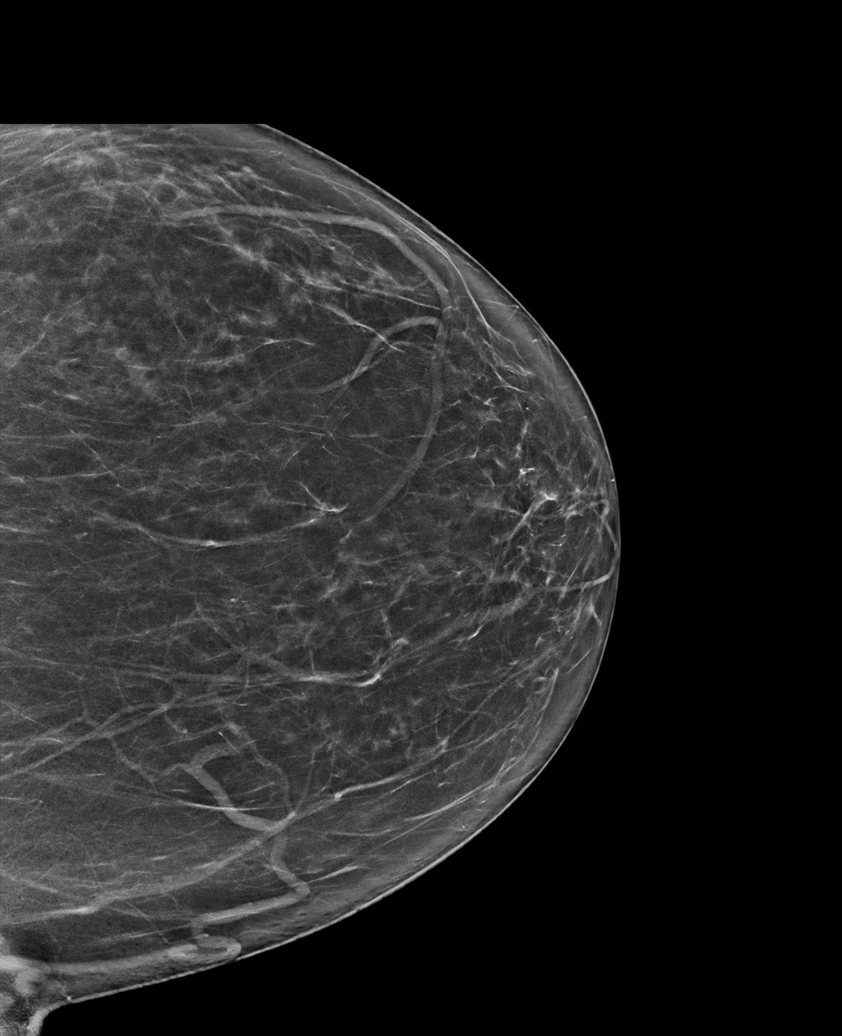

[R MLO synth-2D (2 of 2)]
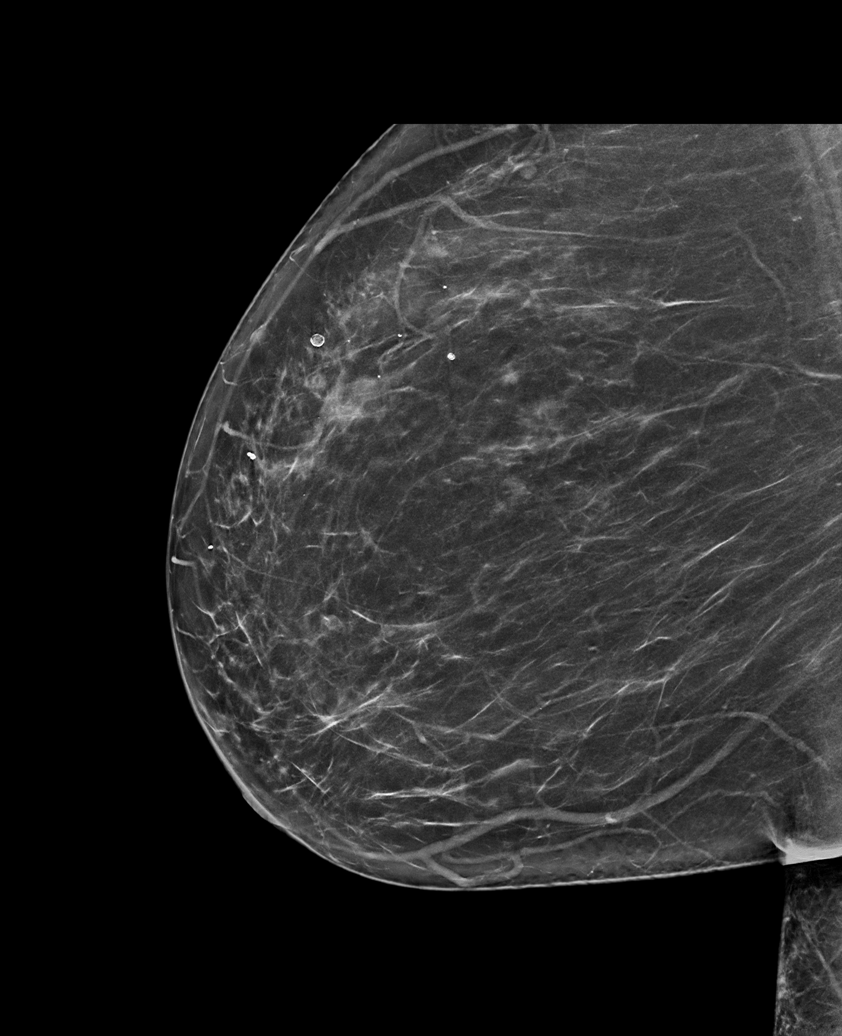

[L CC tomo · tomo slice 37/73.0]
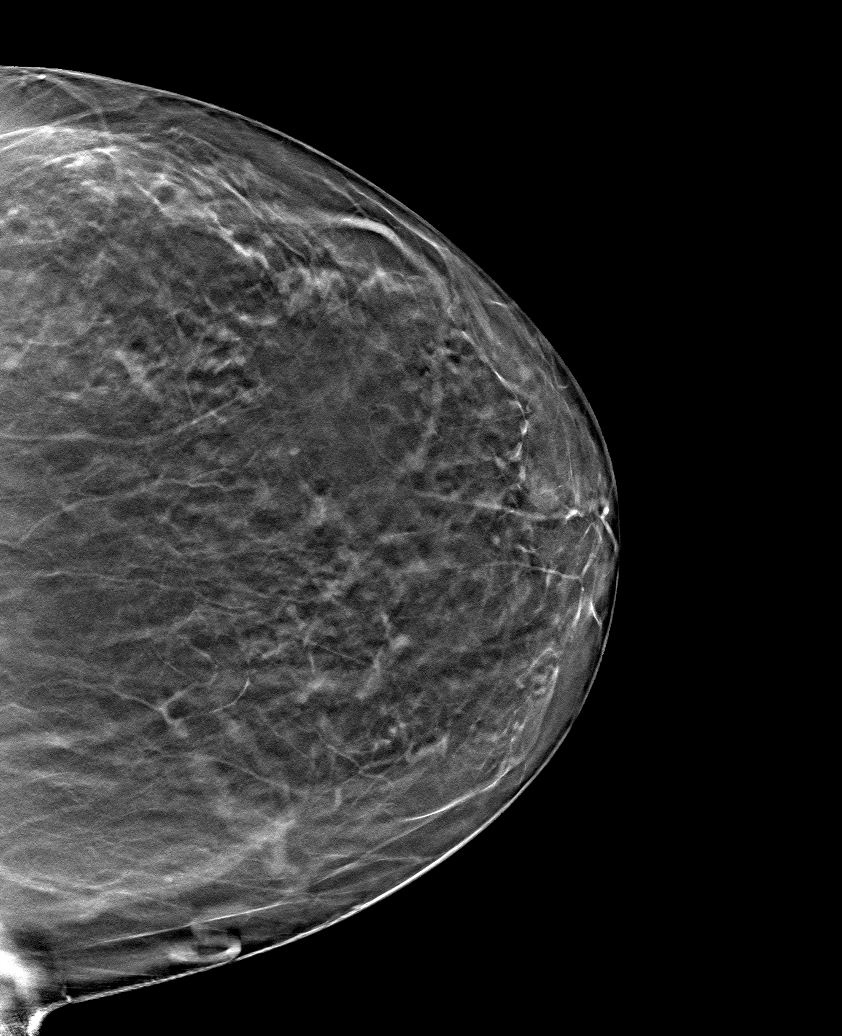

[6 of 30 positions shown; findings below may reference images not displayed]

ACR Breast Density Category b: There are scattered areas of
fibroglandular density.
FINDINGS: There are no findings suspicious for malignancy. Images were
processed with CAD.
IMPRESSION: No mammographic evidence of malignancy. A result letter of this
screening mammogram will be mailed directly to the patient.

RECOMMENDATION:
Screening mammogram in one year. (Code:[id number])

BI-RADS CATEGORY  1: Negative.

## 2022-03-16 ENCOUNTER — Encounter: Payer: Self-pay | Admitting: Gastroenterology

## 2023-08-20 ENCOUNTER — Other Ambulatory Visit: Payer: Self-pay

## 2024-06-23 ENCOUNTER — Other Ambulatory Visit (HOSPITAL_COMMUNITY): Payer: Self-pay
